# Patient Record
Sex: Female | Born: 1945
Health system: Southern US, Community
[De-identification: ages and names within clinical notes are randomized; demographics above are authoritative.]

## PROBLEM LIST (undated history)

## (undated) DIAGNOSIS — F419 Anxiety disorder, unspecified: Secondary | ICD-10-CM

## (undated) DIAGNOSIS — K5730 Diverticulosis of large intestine without perforation or abscess without bleeding: Secondary | ICD-10-CM

## (undated) DIAGNOSIS — D098 Carcinoma in situ of other specified sites: Secondary | ICD-10-CM

## (undated) DIAGNOSIS — C3490 Malignant neoplasm of unspecified part of unspecified bronchus or lung: Secondary | ICD-10-CM

## (undated) DIAGNOSIS — J45909 Unspecified asthma, uncomplicated: Secondary | ICD-10-CM

## (undated) DIAGNOSIS — E7800 Pure hypercholesterolemia, unspecified: Secondary | ICD-10-CM

## (undated) DIAGNOSIS — R3129 Other microscopic hematuria: Secondary | ICD-10-CM

## (undated) DIAGNOSIS — D093 Carcinoma in situ of thyroid and other endocrine glands: Secondary | ICD-10-CM

## (undated) DIAGNOSIS — J449 Chronic obstructive pulmonary disease, unspecified: Secondary | ICD-10-CM

## (undated) DIAGNOSIS — K635 Polyp of colon: Secondary | ICD-10-CM

## (undated) DIAGNOSIS — Z8541 Personal history of malignant neoplasm of cervix uteri: Secondary | ICD-10-CM

## (undated) HISTORY — DX: Malignant neoplasm of unspecified part of unspecified bronchus or lung: C34.90

## (undated) HISTORY — PX: APPENDECTOMY: SHX54

## (undated) HISTORY — DX: Anxiety disorder, unspecified: F41.9

## (undated) HISTORY — DX: Unspecified asthma, uncomplicated: J45.909

## (undated) HISTORY — DX: Polyp of colon: K63.5

## (undated) HISTORY — PX: ABDOMINAL HYSTERECTOMY: SHX81

## (undated) HISTORY — DX: Diverticulosis of large intestine without perforation or abscess without bleeding: K57.30

## (undated) HISTORY — DX: Pure hypercholesterolemia, unspecified: E78.00

## (undated) HISTORY — DX: Personal history of malignant neoplasm of cervix uteri: Z85.41

## (undated) HISTORY — DX: Carcinoma in situ of other specified sites: D09.8

## (undated) HISTORY — DX: Chronic obstructive pulmonary disease, unspecified: J44.9

## (undated) HISTORY — DX: Carcinoma in situ of thyroid and other endocrine glands: D09.3

## (undated) HISTORY — DX: Other microscopic hematuria: R31.29

---

## 1983-06-25 HISTORY — PX: TOTAL ABDOMINAL HYSTERECTOMY W/ BILATERAL SALPINGOOPHORECTOMY: SHX83

## 1998-06-17 ENCOUNTER — Encounter: Payer: Self-pay | Admitting: Emergency Medicine

## 1998-06-17 ENCOUNTER — Emergency Department (HOSPITAL_COMMUNITY): Admission: EM | Admit: 1998-06-17 | Discharge: 1998-06-17 | Payer: Self-pay | Admitting: Emergency Medicine

## 1998-11-03 ENCOUNTER — Encounter: Payer: Self-pay | Admitting: Family Medicine

## 1998-11-03 ENCOUNTER — Ambulatory Visit (HOSPITAL_COMMUNITY): Admission: RE | Admit: 1998-11-03 | Discharge: 1998-11-03 | Payer: Self-pay

## 1998-11-10 ENCOUNTER — Encounter: Payer: Self-pay | Admitting: Pulmonary Disease

## 1998-11-10 ENCOUNTER — Ambulatory Visit (HOSPITAL_COMMUNITY): Admission: RE | Admit: 1998-11-10 | Discharge: 1998-11-10 | Payer: Self-pay | Admitting: Pulmonary Disease

## 1998-11-27 ENCOUNTER — Encounter: Payer: Self-pay | Admitting: Thoracic Surgery

## 1998-11-28 ENCOUNTER — Encounter: Payer: Self-pay | Admitting: Thoracic Surgery

## 1998-11-28 ENCOUNTER — Inpatient Hospital Stay (HOSPITAL_COMMUNITY): Admission: RE | Admit: 1998-11-28 | Discharge: 1998-12-04 | Payer: Self-pay | Admitting: Thoracic Surgery

## 1998-11-29 ENCOUNTER — Encounter: Payer: Self-pay | Admitting: Thoracic Surgery

## 1998-11-30 ENCOUNTER — Encounter: Payer: Self-pay | Admitting: Thoracic Surgery

## 1998-12-01 ENCOUNTER — Encounter: Payer: Self-pay | Admitting: Thoracic Surgery

## 1998-12-02 ENCOUNTER — Encounter: Payer: Self-pay | Admitting: Thoracic Surgery

## 1998-12-04 ENCOUNTER — Encounter: Payer: Self-pay | Admitting: Thoracic Surgery

## 1998-12-04 ENCOUNTER — Encounter: Payer: Self-pay | Admitting: Pulmonary Disease

## 1999-03-22 ENCOUNTER — Ambulatory Visit (HOSPITAL_COMMUNITY): Admission: RE | Admit: 1999-03-22 | Discharge: 1999-03-22 | Payer: Self-pay | Admitting: Pulmonary Disease

## 1999-03-22 ENCOUNTER — Encounter: Payer: Self-pay | Admitting: Pulmonary Disease

## 1999-06-04 ENCOUNTER — Encounter: Admission: RE | Admit: 1999-06-04 | Discharge: 1999-06-04 | Payer: Self-pay | Admitting: Thoracic Surgery

## 1999-06-04 ENCOUNTER — Encounter: Payer: Self-pay | Admitting: Thoracic Surgery

## 1999-09-12 ENCOUNTER — Encounter: Admission: RE | Admit: 1999-09-12 | Discharge: 1999-09-12 | Payer: Self-pay | Admitting: Thoracic Surgery

## 1999-09-12 ENCOUNTER — Encounter: Payer: Self-pay | Admitting: Thoracic Surgery

## 2000-01-15 ENCOUNTER — Ambulatory Visit (HOSPITAL_COMMUNITY): Admission: RE | Admit: 2000-01-15 | Discharge: 2000-01-15 | Payer: Self-pay | Admitting: Thoracic Surgery

## 2000-01-16 ENCOUNTER — Encounter: Payer: Self-pay | Admitting: Thoracic Surgery

## 2000-07-16 ENCOUNTER — Encounter: Admission: RE | Admit: 2000-07-16 | Discharge: 2000-07-16 | Payer: Self-pay | Admitting: Thoracic Surgery

## 2000-07-16 ENCOUNTER — Encounter: Payer: Self-pay | Admitting: Thoracic Surgery

## 2000-11-14 ENCOUNTER — Ambulatory Visit (HOSPITAL_COMMUNITY): Admission: RE | Admit: 2000-11-14 | Discharge: 2000-11-14 | Payer: Self-pay | Admitting: Thoracic Surgery

## 2000-11-14 ENCOUNTER — Encounter: Payer: Self-pay | Admitting: Thoracic Surgery

## 2001-02-24 ENCOUNTER — Encounter (INDEPENDENT_AMBULATORY_CARE_PROVIDER_SITE_OTHER): Payer: Self-pay | Admitting: Specialist

## 2001-02-24 ENCOUNTER — Ambulatory Visit (HOSPITAL_COMMUNITY): Admission: RE | Admit: 2001-02-24 | Discharge: 2001-02-24 | Payer: Self-pay | Admitting: Gastroenterology

## 2004-05-25 ENCOUNTER — Ambulatory Visit: Payer: Self-pay | Admitting: Pulmonary Disease

## 2005-04-10 ENCOUNTER — Ambulatory Visit: Payer: Self-pay | Admitting: Pulmonary Disease

## 2005-04-11 ENCOUNTER — Ambulatory Visit: Payer: Self-pay | Admitting: Pulmonary Disease

## 2005-06-04 ENCOUNTER — Ambulatory Visit: Payer: Self-pay | Admitting: Pulmonary Disease

## 2006-04-07 ENCOUNTER — Ambulatory Visit: Payer: Self-pay | Admitting: Pulmonary Disease

## 2006-04-09 ENCOUNTER — Ambulatory Visit: Payer: Self-pay | Admitting: Pulmonary Disease

## 2006-04-09 LAB — CONVERTED CEMR LAB
Basophils Relative: 0.5 % (ref 0.0–1.0)
Chloride: 107 meq/L (ref 96–112)
Creatinine, Ser: 0.9 mg/dL (ref 0.4–1.2)
Eosinophil percent: 3.9 % (ref 0.0–5.0)
GFR calc non Af Amer: 68 mL/min
Glomerular Filtration Rate, Af Am: 82 mL/min/{1.73_m2}
Glucose, Bld: 92 mg/dL (ref 70–99)
Lymphocytes Relative: 38 % (ref 12.0–46.0)
Monocytes Absolute: 0.7 10*3/uL (ref 0.2–0.7)
Monocytes Relative: 8.5 % (ref 3.0–11.0)
Neutro Abs: 4.1 10*3/uL (ref 1.4–7.7)
Neutrophils Relative %: 49.1 % (ref 43.0–77.0)
Platelets: 327 10*3/uL (ref 150–400)
Potassium: 4.1 meq/L (ref 3.5–5.1)
RBC: 4.69 M/uL (ref 3.87–5.11)
Sodium: 142 meq/L (ref 135–145)

## 2007-02-25 ENCOUNTER — Ambulatory Visit: Payer: Self-pay | Admitting: Pulmonary Disease

## 2007-04-16 ENCOUNTER — Ambulatory Visit: Payer: Self-pay | Admitting: Pulmonary Disease

## 2007-04-16 LAB — CONVERTED CEMR LAB
AST: 24 units/L (ref 0–37)
Alkaline Phosphatase: 59 units/L (ref 39–117)
CO2: 31 meq/L (ref 19–32)
Chloride: 107 meq/L (ref 96–112)
Creatinine, Ser: 0.8 mg/dL (ref 0.4–1.2)
GFR calc Af Amer: 94 mL/min
GFR calc non Af Amer: 78 mL/min
LDL Cholesterol: 98 mg/dL (ref 0–99)
MCHC: 34.8 g/dL (ref 30.0–36.0)
MCV: 90.2 fL (ref 78.0–100.0)
Neutro Abs: 4.8 10*3/uL (ref 1.4–7.7)
Neutrophils Relative %: 50.2 % (ref 43.0–77.0)
RBC: 4.62 M/uL (ref 3.87–5.11)
Sodium: 143 meq/L (ref 135–145)
Total Bilirubin: 0.9 mg/dL (ref 0.3–1.2)
Total Protein: 7.6 g/dL (ref 6.0–8.3)
Triglycerides: 105 mg/dL (ref 0–149)

## 2007-04-18 DIAGNOSIS — D126 Benign neoplasm of colon, unspecified: Secondary | ICD-10-CM

## 2007-04-18 DIAGNOSIS — M810 Age-related osteoporosis without current pathological fracture: Secondary | ICD-10-CM | POA: Insufficient documentation

## 2007-04-18 DIAGNOSIS — E7800 Pure hypercholesterolemia, unspecified: Secondary | ICD-10-CM | POA: Insufficient documentation

## 2007-04-18 DIAGNOSIS — J209 Acute bronchitis, unspecified: Secondary | ICD-10-CM

## 2007-08-18 ENCOUNTER — Ambulatory Visit: Payer: Self-pay | Admitting: Pulmonary Disease

## 2007-10-28 ENCOUNTER — Telehealth (INDEPENDENT_AMBULATORY_CARE_PROVIDER_SITE_OTHER): Payer: Self-pay | Admitting: *Deleted

## 2007-10-30 ENCOUNTER — Telehealth (INDEPENDENT_AMBULATORY_CARE_PROVIDER_SITE_OTHER): Payer: Self-pay | Admitting: *Deleted

## 2007-11-24 ENCOUNTER — Encounter: Payer: Self-pay | Admitting: Pulmonary Disease

## 2008-04-27 ENCOUNTER — Ambulatory Visit: Payer: Self-pay | Admitting: Pulmonary Disease

## 2008-04-27 DIAGNOSIS — C3490 Malignant neoplasm of unspecified part of unspecified bronchus or lung: Secondary | ICD-10-CM | POA: Insufficient documentation

## 2008-04-28 DIAGNOSIS — J309 Allergic rhinitis, unspecified: Secondary | ICD-10-CM

## 2008-04-28 DIAGNOSIS — J449 Chronic obstructive pulmonary disease, unspecified: Secondary | ICD-10-CM

## 2008-04-28 DIAGNOSIS — C539 Malignant neoplasm of cervix uteri, unspecified: Secondary | ICD-10-CM

## 2008-04-28 DIAGNOSIS — F419 Anxiety disorder, unspecified: Secondary | ICD-10-CM | POA: Insufficient documentation

## 2008-04-28 DIAGNOSIS — F411 Generalized anxiety disorder: Secondary | ICD-10-CM

## 2008-04-28 DIAGNOSIS — K5730 Diverticulosis of large intestine without perforation or abscess without bleeding: Secondary | ICD-10-CM

## 2008-05-03 LAB — CONVERTED CEMR LAB
Alkaline Phosphatase: 67 units/L (ref 39–117)
Basophils Absolute: 0.1 10*3/uL (ref 0.0–0.1)
Bilirubin, Direct: 0.2 mg/dL (ref 0.0–0.3)
CO2: 30 meq/L (ref 19–32)
Calcium: 9.5 mg/dL (ref 8.4–10.5)
Cholesterol: 143 mg/dL (ref 0–200)
Crystals: NEGATIVE
Eosinophils Absolute: 0.3 10*3/uL (ref 0.0–0.7)
GFR calc Af Amer: 94 mL/min
GFR calc non Af Amer: 78 mL/min
HCT: 41.5 % (ref 36.0–46.0)
Hemoglobin: 14.4 g/dL (ref 12.0–15.0)
LDL Cholesterol: 83 mg/dL (ref 0–99)
Leukocytes, UA: NEGATIVE
Lymphocytes Relative: 28.1 % (ref 12.0–46.0)
Neutro Abs: 7 10*3/uL (ref 1.4–7.7)
Neutrophils Relative %: 63.5 % (ref 43.0–77.0)
Nitrite: POSITIVE — AB
RDW: 11.7 % (ref 11.5–14.6)
Specific Gravity, Urine: 1.02 (ref 1.000–1.03)
Total CHOL/HDL Ratio: 3.9
Urobilinogen, UA: 0.2 (ref 0.0–1.0)
WBC: 11.1 10*3/uL — ABNORMAL HIGH (ref 4.5–10.5)
pH: 6 (ref 5.0–8.0)

## 2008-06-21 ENCOUNTER — Ambulatory Visit: Payer: Self-pay | Admitting: Pulmonary Disease

## 2008-08-05 ENCOUNTER — Ambulatory Visit: Payer: Self-pay | Admitting: Pulmonary Disease

## 2008-08-11 DIAGNOSIS — J069 Acute upper respiratory infection, unspecified: Secondary | ICD-10-CM | POA: Insufficient documentation

## 2009-01-26 ENCOUNTER — Encounter: Payer: Self-pay | Admitting: Pulmonary Disease

## 2009-01-31 ENCOUNTER — Encounter: Payer: Self-pay | Admitting: Pulmonary Disease

## 2009-03-31 ENCOUNTER — Telehealth (INDEPENDENT_AMBULATORY_CARE_PROVIDER_SITE_OTHER): Payer: Self-pay | Admitting: *Deleted

## 2009-05-08 ENCOUNTER — Encounter: Payer: Self-pay | Admitting: Adult Health

## 2009-05-08 ENCOUNTER — Ambulatory Visit: Payer: Self-pay | Admitting: Pulmonary Disease

## 2009-05-08 LAB — CONVERTED CEMR LAB
Alkaline Phosphatase: 62 units/L (ref 39–117)
BUN: 10 mg/dL (ref 6–23)
Calcium: 9.5 mg/dL (ref 8.4–10.5)
Chloride: 104 meq/L (ref 96–112)
Creatinine, Ser: 0.8 mg/dL (ref 0.4–1.2)
Eosinophils Absolute: 0.3 10*3/uL (ref 0.0–0.7)
Eosinophils Relative: 2.6 % (ref 0.0–5.0)
Glucose, Bld: 101 mg/dL — ABNORMAL HIGH (ref 70–99)
HDL: 41 mg/dL (ref >39.00)
Leukocytes, UA: NEGATIVE
Lymphocytes Relative: 30.1 % (ref 12.0–46.0)
Lymphs Abs: 3 10*3/uL (ref 0.7–4.0)
Platelets: 299 10*3/uL (ref 150.0–400.0)
Potassium: 4.3 meq/L (ref 3.5–5.1)
RBC: 4.68 M/uL (ref 3.87–5.11)
TSH: 0.33 microintl units/mL — ABNORMAL LOW (ref 0.35–5.50)
Total Bilirubin: 1 mg/dL (ref 0.3–1.2)
Total CHOL/HDL Ratio: 4
Total Protein, Urine: NEGATIVE mg/dL
Total Protein: 7.6 g/dL (ref 6.0–8.3)
VLDL: 23.6 mg/dL (ref 0.0–40.0)
WBC: 10.1 10*3/uL (ref 4.5–10.5)
pH: 6.5 (ref 5.0–8.0)

## 2009-06-15 ENCOUNTER — Telehealth (INDEPENDENT_AMBULATORY_CARE_PROVIDER_SITE_OTHER): Payer: Self-pay | Admitting: *Deleted

## 2009-07-24 ENCOUNTER — Encounter: Payer: Self-pay | Admitting: Pulmonary Disease

## 2009-11-28 ENCOUNTER — Ambulatory Visit: Payer: Self-pay | Admitting: Adult Health

## 2010-02-21 ENCOUNTER — Encounter: Payer: Self-pay | Admitting: Pulmonary Disease

## 2010-03-01 ENCOUNTER — Encounter: Payer: Self-pay | Admitting: Pulmonary Disease

## 2010-05-10 ENCOUNTER — Ambulatory Visit: Payer: Self-pay | Admitting: Pulmonary Disease

## 2010-05-12 DIAGNOSIS — R3129 Other microscopic hematuria: Secondary | ICD-10-CM

## 2010-05-12 LAB — CONVERTED CEMR LAB
Basophils Relative: 0.7 % (ref 0.0–3.0)
Bilirubin, Direct: 0.1 mg/dL (ref 0.0–0.3)
CO2: 30 meq/L (ref 19–32)
Cholesterol: 150 mg/dL (ref 0–200)
GFR calc non Af Amer: 75.84 mL/min (ref >60)
HCT: 42.5 % (ref 36.0–46.0)
Hemoglobin: 14.7 g/dL (ref 12.0–15.0)
Lymphocytes Relative: 32.5 % (ref 12.0–46.0)
MCHC: 34.5 g/dL (ref 30.0–36.0)
MCV: 92.2 fL (ref 78.0–100.0)
Monocytes Absolute: 0.7 10*3/uL (ref 0.1–1.0)
Monocytes Relative: 7.1 % (ref 3.0–12.0)
Neutrophils Relative %: 57.4 % (ref 43.0–77.0)
Platelets: 299 10*3/uL (ref 150.0–400.0)
RDW: 12.4 % (ref 11.5–14.6)
Sodium: 141 meq/L (ref 135–145)
Total Bilirubin: 0.8 mg/dL (ref 0.3–1.2)
Total Protein: 7.4 g/dL (ref 6.0–8.3)
VLDL: 23.2 mg/dL (ref 0.0–40.0)
WBC: 9.8 10*3/uL (ref 4.5–10.5)

## 2010-06-26 ENCOUNTER — Telehealth (INDEPENDENT_AMBULATORY_CARE_PROVIDER_SITE_OTHER): Payer: Self-pay | Admitting: *Deleted

## 2010-07-24 NOTE — Letter (Signed)
Summary: [facility]  [facility]   Imported By: Sherian Rein 08/04/2009 13:05:53  _____________________________________________________________________  External Attachment:    Type:   Image     Comment:   External Document

## 2010-07-24 NOTE — Assessment & Plan Note (Signed)
Summary: cpx/fasting/apc   Primary Care Provider:  Dr. Alroy Dust  CC:  2 year ROV & CPX....  History of Present Illness: 65 y/o WF here for a follow up visit and CPX... she has multiple medical problems including COPD, hx adenocarcinoma RLL w/ lobectomy 2000 & no known recurrence, Hyperlipidemia, hx ca cervix w/ hyst 1985, osteopenia, and anxiety...   ~  May 10, 2010:  she states she's been doing well- denies cough, phlegm, dyspnea... she quit smoking in 1997 but still chews nicorette gum daily... notes that she's been walking some & "exercise really helps my breathing"... Chol controlled on Lip10;  GI stable & up to date;  she tells me she is NOT taking her Actonel & may as well stop this Rx officially (despite her osteopenia)... she was found to have some microscopic hematuria & right flank discomfort w/ thorough eval by DrTannenbaum- neg CT, cysto, NMP22- he continues to follow this a stress incontinence...    Current Problem List:  ALLERGIC RHINITIS (ICD-477.9) - on ALLEGRA 180mg /d,  FLONASE 2spQhs...  COPD (ICD-496) & ASTHMATIC BRONCHITIS, ACUTE (ICD-466.0) - on ADVAIR 250Bid,  SINGULAIR 10mg /d... states doing well, breathing well... denies cough, sputum, hemoptysis, worsening dyspnea,  wheezing, chest pains, snoring, daytime hypersomnolence, etc...   ~  baseline CXR w/ pleural thickening on right (prev surg), NAD...  ~  PFT 10/08 showed FVC= 2.69 (108%), FEV1= 1.27 (70%), %1sec= 47, mid-flows= 17%pred.  ~  CXR 11/09 & 11/10 showed COPD, NAD.  ~  CXR 11/11 showed COPD w/ bullous dis, scar right base, NAD...  Hx of ADENOCARCINOMA, LUNG (ICD-162.9) - former very heavy smoker (up to 2ppd) and quit in 1997... routine CXR in 2000 showed a 1.5cm RLL nodule... eval revealed a non-small cell cancer-  s/p RLLobectomy 6/00 by DrBurney for poorly diff AdenoCa... Oncology eval by Duwayne Heck w/ no other therapy recommended... no known recurrence w/ yearly f/u since 2000.  HYPERCHOLESTEROLEMIA  (ICD-272.0) - on LIPITOR 10mg /d...  ~  FLP 10/08 on Lip10 showed TChol 168,TG 105, HDL 49, LDL 98... rec- same med + better diet!  ~  FLP 11/09 on Lip10 showed TChol 143, TG 120, HDL 37, LDL 83  ~  FLP 11/10 on Lip10 showed TChol 156, TG 118, HDL 41, LDL 91  ~  FLP 11/11 on Lip10 showed TChol 150, TG 116, HDL 41, LDL 86  DIVERTICULOSIS OF COLON (ICD-562.10) COLONIC POLYPS (ICD-211.3) - tubular adenoma removed in 1997... last colonoscopy 6/07 by [facility] showing diminutive polyp and divertics... f/u planned 55yrs.  Hx of CERVICAL CANCER (ICD-180.9) & MICROSCOPIC HEMATURIA (ICD-599.72) - s/p hysterectomy & unilateral oopherectomy for Ca in situ of cervix 1985 by DrMcPhail... she is now followed by DrKRichardson & had some microscopic hematuria noted> referred to Urology w/ eval DrTannenbaum (neg CT cysto, NMP22).  OSTEOPOROSIS (ICD-733.00) - prev on Actonel but she stopped this on her own & doesn't want to restart bisphos therapy... takes Calcium supplements & Vitamins...  ~  BMD at [facility] 9/06 showed TScores -1.9 in Spine,  -1.8 in left THip,  -2.1 in left FemNeck.  ~  labs 11/09 showed Vit D level = 32... rec to start OTC Vit D supplement  ~1000u daily.  ~  labs 11/10 showed Vit D level = 35... continue daily supplementation.  ~  she will inquire about f/u BMD & have results sent to [place] to review...  ANXIETY (ICD-300.00) - takes ATIVAN 1mg  Qhs...  Health Maintenance: she takes ASA 81mg /d...  ~  GI:  followed by [facility] w/ last colon 6/07 w/ diminutive polyp & divertics> f/u due 6/12  ~  GYN:  followed by DrRichardson for PAP etc (she sent her to Urology for hematuria)... goes to [facility] for Mammograms...  ~  Immunizations:  she had a PNEUMOVAX in 2003... gets yearly FLU shots...   Preventive Screening-Counseling & Management  Alcohol-Tobacco     Smoking Status: quit     Packs/Day: 2.0     Year Quit: 1997     Pack years: 31yrs, 2 ppd  Allergies: 1)  !  Sulfa  Comments:  Nurse/Medical Assistant: The patient's medications and allergies were reviewed with the patient and were updated in the Medication and Allergy Lists.  Past History:  Past Medical History: ALLERGIC RHINITIS (ICD-477.9) ASTHMATIC BRONCHITIS, ACUTE (ICD-466.0) COPD (ICD-496) Hx of ADENOCARCINOMA, LUNG (ICD-162.9) HYPERCHOLESTEROLEMIA (ICD-272.0) DIVERTICULOSIS OF COLON (ICD-562.10) COLONIC POLYPS (ICD-211.3) Hx of CERVICAL CANCER (ICD-180.9) MICROSCOPIC HEMATURIA (ICD-599.72) OSTEOPOROSIS (ICD-733.00) ANXIETY (ICD-300.00)  Past Surgical History: S/P appendectomy S/P hysterectomy and unilateral oopherectomy in 1985 by DrMcPhail for Ca of cervix  Family History: Reviewed history from 05/08/2009 and no changes required. asthma - Mat aunt heart disease - father, PGF, MGM rheumatism - mother cancer - PGM (breast),  MGF (prostate) hypercholesterolemia - father DM - brother stroke - father  Social History: Reviewed history from 05/08/2009 and no changes required. former smoker.  quit in 1990, x74yrs 2ppd. no alcohol caffeine use: 3 sodas daily. 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    Vital Signs:  Patient profile:   65 year old female Height:      58 inches Weight:      147.25 pounds BMI:     30.89 O2 Sat:      98 % on Room air Temp:     97.8 degrees F oral Pulse rate:   72 / minute BP sitting:   132 / 78  (left arm) Cuff size:    regular  Vitals Entered By: Randell Loop CMA (May 10, 2010 10:32 AM)  O2 Sat at Rest %:  98 O2 Flow:  Room air CC: 2 year ROV & CPX... Is Patient Diabetic? No Pain Assessment Patient in pain? yes      Onset of pain  pain in left leg that wakes her up sometimes Comments meds updated today with pt    Physical Exam  Additional Exam:  WD, petite, overweight, 65 y/o WF in NAD... GENERAL:  Alert & oriented; pleasant & cooperative... HEENT:  [place]/AT, EOM-wnl, PERRLA, EACs-clear, TMs-wnl, NOSE-clear drainage , THROAT-clear & wnl. NECK:  Supple w/ fairROM; no JVD; normal carotid impulses w/o bruits; no thyromegaly or nodules palpated; no lymphadenopathy. CHEST:  Decr BS bilat but clear to P & A- without wheezes/ rales/ or rhonchi heard... HEART:  Regular Rhythm; without murmurs/ rubs/ or gallops detected... ABDOMEN:  Soft & nontender; normal bowel sounds; no organomegaly or masses palpated... EXT: without deformities, mild arthritic changes; no varicose veins/ venous insuffic/ or edema. NEURO:   no focal deficits noted.     CXR  Procedure date:  05/10/2010  Findings:  CHEST - 2 VIEW Comparison: Plain films of the chest 04/27/2008 and 05/08/2009.   Findings: Lungs are emphysematous with bullous change.  There is some mild scar in the right lung base.  Lungs otherwise clear. Heart size is normal.  No pleural effusion or pneumothorax.   IMPRESSION: COPD.  No acute finding.   Read By:  Charyl Dancer,  M.D.   MISC. Report  Procedure date:  05/10/2010  Findings:      BMP (METABOL)   Sodium                    141 mEq/L                   135-145   Potassium                 4.3 mEq/L                   3.5-5.1   Chloride                  101 mEq/L                   96-112   Carbon Dioxide            30 mEq/L                    19-32   Glucose                   79 mg/dL                    82-95   BUN                       15 mg/dL                    6-21    Creatinine                0.8 mg/dL                   3.0-8.6   Calcium                   9.3 mg/dL                   5.7-84.6   GFR                       75.84 mL/min                >60  Hepatic/Liver Function Panel (HEPATIC)   Total Bilirubin           0.8 mg/dL                   9.6-2.9   Direct Bilirubin          0.1 mg/dL                   5.2-8.4   Alkaline Phosphatase      70 U/L                      39-117   AST                       23 U/L  0-37   ALT                       23 U/L                      0-35   Total Protein             7.4 g/dL                    8.4-6.9   Albumin                   4.3 g/dL                    6.2-9.5  CBC Platelet w/Diff (CBCD)   White Cell Count          9.8 K/uL                    4.5-10.5   Red Cell Count            4.60 Mil/uL                 3.87-5.11   Hemoglobin                14.7 g/dL                   28.4-13.2   Hematocrit                42.5 %                      36.0-46.0   MCV                       92.2 fl                     78.0-100.0   Platelet Count            299.0 K/uL                  150.0-400.0   Neutrophil %              57.4 %                      43.0-77.0   Lymphocyte %              32.5 %                      12.0-46.0   Monocyte %                7.1 %                       3.0-12.0   Eosinophils%              2.3 %                       0.0-5.0   Basophils %               0.7 %                       0.0-3.0  Comments:      Lipid Panel (LIPID)   Cholesterol  150 mg/dL                   1-610   Triglycerides             116.0 mg/dL                 9.6-045.4   HDL                       09.81 mg/dL                 >19.14   LDL Cholesterol           86 mg/dL                    7-82  TSH (TSH)   FastTSH              [L]  0.31 uIU/mL                 0.35-5.50   Impression & Recommendations:  Problem # 1:  PHYSICAL EXAMINATION (ICD-V70.0)  Orders: TLB-BMP (Basic Metabolic  Panel-BMET) (80048-METABOL) TLB-Hepatic/Liver Function Pnl (80076-HEPATIC) TLB-CBC Platelet - w/Differential (85025-CBCD) TLB-Lipid Panel (80061-LIPID) TLB-TSH (Thyroid Stimulating Hormone) (84443-TSH)  Problem # 2:  COPD (ICD-496) She is an ex-smoker, still chews nicorette gum after 15 yrs... continue meds & exerc program... Her updated medication list for this problem includes:    Singulair 10 Mg Tabs (Montelukast sodium) .Marland Kitchen... Take 1 tablet by mouth once a day    Advair Diskus 250-50 Mcg/dose Misc (Fluticasone-salmeterol) .Marland Kitchen... Take one puff two times a day -  rinse mouth after each use  Orders: T-2 View CXR (71020TC)  Problem # 3:  Hx of ADENOCARCINOMA, LUNG (ICD-162.9) No known recurrence...  Problem # 4:  HYPERCHOLESTEROLEMIA (ICD-272.0) Stable on Lip10 + diet... Her updated medication list for this problem includes:    Lipitor 10 Mg Tabs (Atorvastatin calcium) .Marland Kitchen... Take 1 tablet by mouth once a day  Problem # 5:  COLONIC POLYPS (ICD-211.3) GI is stable & up to date...  Problem # 6:  MICROSCOPIC HEMATURIA (ICD-599.72) GU followed by DrRichardson & DrTannenbaum...  Problem # 7:  OSTEOPOROSIS (ICD-733.00) She refuses bisphos therapy & wants a drug holiday... she will inquire having f/u BMD... The following medications were removed from the medication list:    Actonel 35 Mg Tabs (Risedronate sodium) .Marland Kitchen... Take one tablet every week  Problem # 8:  OTHER MEDICAL PROBLEMS AS NOTED>>>  Complete Medication List: 1)  Allegra 180 Mg Tabs (Fexofenadine hcl) .... Take 1 tablet by mouth once a day 2)  Flonase 50 Mcg/act Susp (Fluticasone propionate) .... 2 sprays two times a day 3)  Singulair 10 Mg Tabs (Montelukast sodium) .... Take 1 tablet by mouth once a day 4)  Advair Diskus 250-50 Mcg/dose Misc (Fluticasone-salmeterol) .... Take one puff two times a day -  rinse mouth after each use 5)  Adult Aspirin Ec Low Strength 81 Mg Tbec (Aspirin) .... Take 1 tablet by mouth once a  day 6)  Lipitor 10 Mg Tabs (Atorvastatin calcium) .... Take 1 tablet by mouth once a day 7)  Calcium Carbonate-vitamin D 600-400 Mg-unit Tabs (Calcium carbonate-vitamin d) .... Take 1 tablet by mouth once a day 8)  Vitamin D 1000 Unit Tabs (Cholecalciferol) .... Take 1 tablet by mouth once a day 9)  Ativan 1 Mg Tabs (Lorazepam) .... Take one tablet at bedtime as needed  Patient Instructions: 1)  Today  we updated your med list- see below.... 2)  we refilled your meds for 90d supplies as requested... 3)  Today we did your folllow up CXR & Fasting blood work... please call the "phone tree" in a few days for your lab results.Marland KitchenMarland Kitchen 4)  Call for any problems.Marland KitchenMarland Kitchen 5)  Please schedule a follow-up appointment in 1 year, sooner as needed. Prescriptions: ATIVAN 1 MG  TABS (LORAZEPAM) take one tablet at bedtime as needed  #90 x 4   Entered and Authorized by:   Michele Mcalpine MD   Signed by:   Michele Mcalpine MD on 05/10/2010   Method used:   Print then Give to Patient   RxID:   [id number] LIPITOR 10 MG  TABS (ATORVASTATIN CALCIUM) Take 1 tablet by mouth once a day  #90 x 4   Entered and Authorized by:   Michele Mcalpine MD   Signed by:   Michele Mcalpine MD on 05/10/2010   Method used:   Print then Give to Patient   RxID:   [id number] ADVAIR DISKUS 250-50 MCG/DOSE MISC (FLUTICASONE-SALMETEROL) take one puff two times a day -  rinse mouth after each use  #3 x 4   Entered and Authorized by:   Michele Mcalpine MD   Signed by:   Michele Mcalpine MD on 05/10/2010   Method used:   Print then Give to Patient   RxID:   [id number] SINGULAIR 10 MG  TABS (MONTELUKAST SODIUM) Take 1 tablet by mouth once a day  #90 x 4   Entered and Authorized by:   Michele Mcalpine MD   Signed by:   Michele Mcalpine MD on 05/10/2010   Method used:   Print then Give to Patient   RxID:   [id number] FLONASE 50 MCG/ACT  SUSP (FLUTICASONE PROPIONATE) 2 sprays two times a day  #3 x 4   Entered and Authorized by:   Michele Mcalpine  MD   Signed by:   Michele Mcalpine MD on 05/10/2010   Method used:   Print then Give to Patient   RxID:   [id number] ALLEGRA 180 MG  TABS (FEXOFENADINE HCL) Take 1 tablet by mouth once a day  #90 x 4   Entered and Authorized by:   Michele Mcalpine MD   Signed by:   Michele Mcalpine MD on 05/10/2010   Method used:   Print then Give to Patient   RxID:   [id number]    Immunization History:  Influenza Immunization History:    Influenza:  historical (04/02/2010)

## 2010-07-24 NOTE — Assessment & Plan Note (Signed)
Summary: Acute NP office visit   Visit Type:  Acute NP office visit Primary Provider/Referring Provider:  Dr. Alroy Dust  CC:  Pt c/o PND, eyes, nose, and throat burning x 1 day, left side nasal congestion, right side runny nose, productive cough, SOB with exertion. Pt states is only using Advair once at night. Pt states if Rx called in today [facility] [address]. Pt denies chest tightness, and wheezing.  History of Present Illness: 65  year old female with known history of asthma and allergic rhinitis  -previous lung cancer -s/p RLL lobectomy    August 05, 2008 --Presents for an acute visit office viist for sinus pain, congestion, pressure, cough w/ .yellow and green sputum., loose stools, lightheaded.   May 08, 2009--Presents for annual visit w/ labs. Since last visit, doing well. Recent BMD showed T score of -2.2 on actonel. Mammogram was neg. She is overdue for routine GYN visit. Colonoscopy is due in 2012. We discussed healthy lifestyle  measures such as diet, exercsie.  Blood pressure has been borderline last couple visit. Recommended to decrease sodium, weight loss. keep b/p log.     November 28, 2009--Presents for an acute office visit. Complains of Pt c/o PND, eyes, nose, and throat burning x 1 day, left side nasal congestion, right side runny nose, productive cough, SOB with exertion. Pt states is only using Advair once at night. Pt states if Rx called in today [facility].. OTC not helping. Restarted on allegra today.Denies chest pain, dyspnea, orthopnea, hemoptysis, fever, n/v/d, edema, headache,recent travel or antibiotics. Pt c/o PND, eyes, nose, and throat burning x 1 day, left side nasal congestion, right side runny nose, productive cough, SOB with exertion. Pt states is only using Advair once at night. Pt states if Rx called in today [facility]. Pt denies chest tightness,  wheezing  Medications Prior to Update: 1)  Allegra 180 Mg  Tabs (Fexofenadine Hcl) .... Take 1 Tablet By Mouth Once A Day 2)  Flonase 50 Mcg/act  Susp (Fluticasone Propionate) .... 2 Sprays Two Times A Day 3)  Singulair 10 Mg  Tabs (Montelukast Sodium) .... Take 1 Tablet By Mouth Once A Day 4)  Advair Diskus 250-50 Mcg/dose Misc (Fluticasone-Salmeterol) .... Take One Puff Two Times A Day -  Rinse Mouth After Each Use 5)  Adult Aspirin Ec Low Strength 81 Mg  Tbec (Aspirin) .... Take 1 Tablet By Mouth Once A Day 6)  Lipitor 10 Mg  Tabs (Atorvastatin Calcium) .... Take 1 Tablet By Mouth Once A Day 7)  Actonel 35 Mg  Tabs (Risedronate Sodium) .... Take One Tablet Every Week 8)  Calcium Carbonate-Vitamin D 600-400 Mg-Unit  Tabs (Calcium Carbonate-Vitamin D) .... Take 1 Tablet By Mouth Once A Day 9)  Ativan 1 Mg  Tabs (Lorazepam) .... Take One Tablet At Bedtime As Needed 10)  Avelox 400 Mg Tabs (Moxifloxacin Hcl) .Marland Kitchen.. 1 By Mouth Once Daily Until Gone  Current Medications (verified): 1)  Allegra 180 Mg  Tabs (Fexofenadine Hcl) .... Take 1 Tablet By Mouth Once A Day 2)  Flonase 50 Mcg/act  Susp (Fluticasone Propionate) .... 2 Sprays Two Times A Day 3)  Singulair 10 Mg  Tabs (Montelukast Sodium) .... Take 1 Tablet By Mouth Once A Day 4)  Advair Diskus 250-50 Mcg/dose Misc (Fluticasone-Salmeterol) .... Take One Puff Two Times A Day -  Rinse Mouth After Each Use 5)  Adult Aspirin Ec Low Strength 81 Mg  Tbec (Aspirin) .... Take 1 Tablet By Mouth Once A Day 6)  Lipitor 10 Mg  Tabs (Atorvastatin Calcium) .... Take 1 Tablet By Mouth Once A Day 7)  Actonel 35 Mg  Tabs (Risedronate Sodium) .... Take One Tablet Every Week 8)  Calcium Carbonate-Vitamin D 600-400 Mg-Unit  Tabs (Calcium Carbonate-Vitamin D) .... Take 1 Tablet By Mouth Once A Day 9)  Ativan 1 Mg  Tabs (Lorazepam) .... Take One Tablet At Bedtime As Needed  Allergies (verified): 1)  ! Sulfa  Past History:  Family History: Last updated:  05/08/2009 asthma - Mat aunt heart disease - father, PGF, MGM rheumatism - mother cancer - PGM (breast),  MGF (prostate) hypercholesterolemia - father DM - brother stroke - father  Social History: Last updated: 05/08/2009 former smoker.  quit in 1990, x72yrs 2ppd. no alcohol caffeine use: 3 sodas daily. 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 gets yearly FLU shots... she takes ASA 81mg /d...  ALLERGIC RHINITIS (ICD-477.9) - on ALLEGRA 180mg /d,  FLONASE 2spQhs...  ASTHMATIC BRONCHITIS, ACUTE (ICD-466.0), & COPD (ICD-496) - on ADVAIR 250Bid,  SINGULAIR 10mg /d... states doing well, breathing well...    ~  baseline CXR w/ pleural thickening on right (prev surg), NAD...  ~  PFT 10/08 showed FVC= 2.69 (108%), FEV1= 1.27 (70%), %1sec= 47, mid-flows= 17%pred...  Hx of ADENOCARCINOMA, LUNG (ICD-162.9) - former very heavy smoker and quit in 1997... routine CXR in 2000 showed a 1.5cm RLL nodule... eval revealed a non-small cell cancer-  s/p RLLobectomy 6/00 by DrBurney for poorly diff AdenoCa... Oncology eval by Duwayne Heck w/ no other therapy recommended... no known recurrence w/ yearly f/u since 2000.  HYPERCHOLESTEROLEMIA (ICD-272.0) - on LIPITOR 10mg /d...  ~  FLP 10/08 showed TChol 168,TG 105, HDL 49, LDL 98... rec- same med + better diet!  ~ FLP 11/10 showed   HDL:41.00   LDL:91  Chol:156   Trig:118.0   DIVERTICULOSIS OF COLON (ICD-562.10) COLONIC POLYPS (ICD-211.3) - tubular adenoma removed in 1997... last colonoscopy 6/07 by [facility] showing diminutive polyp and divertics... f/u planned 74yrs.  Hx of CERVICAL CANCER (ICD-180.9) - s/p hysterectomy & unilateral oopherectomy for Ca in situ of cervix 1985 by DrMcPhail...  OSTEOPOROSIS (ICD-733.00) - on ACTONEL 35mg /wk + Calcium supplements & Vitamins...  ~  BMD at [facility] 9/06 showed TScores -1.9 in Spine,  -1.8 in left THip   ~ BMD at [facility] 8/10  showed TScores- -1.9 Spine, -1.9 hip, and -2.2 left hem neck   Vital Signs:  Patient profile:   65 year old female Height:      58 inches Weight:      148 pounds BMI:     31.04 O2 Sat:      94 % on Room air Temp:     99.7 degrees F oral Pulse rate:   82 / minute BP sitting:   152 / 82  (left arm) Cuff size:   regular  Vitals Entered By: Zackery Barefoot CMA (November 28, 2009 3:11 PM)  O2 Flow:  Room air CC: Pt c/o PND, eyes, nose, and throat burning x 1 day, left side nasal congestion, right side runny nose, productive cough, SOB with exertion. Pt states is only using Advair once at night. Pt states if [facility] called [facility]. Pt denies chest tightness, wheezing Is Patient Diabetic? No Comments Medications reviewed with  patient Verified contact number and pharmacy with patient Zackery Barefoot CMA  November 28, 2009 3:19 PM    Physical Exam  Additional Exam:  WD, petite, overweight, 65 y/o WF in NAD... GENERAL:  Alert & oriented; pleasant & cooperative... HEENT:  [place]/AT, EOM-wnl, PERRLA, EACs-clear, TMs-wnl, NOSE-clear drainage , THROAT-clear & wnl. NECK:  Supple w/ fairROM; no JVD; normal carotid impulses w/o bruits; no thyromegaly or nodules palpated; no lymphadenopathy. CHEST:  Clear to P & A; without wheezes/ rales/ or rhonchi heard... HEART:  Regular Rhythm; without murmurs/ rubs/ or gallops detected... ABDOMEN:  Soft & nontender; normal bowel sounds; no organomegaly or masses palpated... EXT: without deformities, mild arthritic changes; no varicose veins/ venous insuffic/ or edema. NEURO:   no focal deficits noted.      Impression & Recommendations:  Problem # 1:  UPPER RESPIRATORY INFECTION (ICD-465.9)  Allegiic rhiniis flare vs URI  REC:  Increase Advair 1 puff two times a day  Continue Allegra qam  May add benadryl 25mg  at bedtime for 3 days Saline nasal rinses as needed  Add Astepro 2 puffs at bedtime until sample is gone.   May have Omnicef 300mg  two times a day for 5 days if symptoms worsen with discolored.  Please contact office for sooner follow up if symptoms do not improve or worsen   Her updated medication list for this problem includes:    Allegra 180 Mg Tabs (Fexofenadine hcl) .Marland Kitchen... Take 1 tablet by mouth once a day    Adult Aspirin Ec Low Strength 81 Mg Tbec (Aspirin) .Marland Kitchen... Take 1 tablet by mouth once a day  Orders: Est. Patient Level III (09811)  Problem # 2:       Medications Added to Medication List This Visit: 1)  Cefdinir 300 Mg Caps (Cefdinir) .Marland Kitchen.. 1 by mouth two times a day  Complete Medication List: 1)  Allegra 180 Mg Tabs (Fexofenadine hcl) .... Take 1 tablet by mouth once a day 2)  Flonase 50 Mcg/act Susp (Fluticasone propionate) .... 2 sprays two times a day 3)  Singulair 10 Mg Tabs (Montelukast sodium) .... Take 1 tablet by mouth once a day 4)  Advair Diskus 250-50 Mcg/dose Misc (Fluticasone-salmeterol) .... Take one puff two times a day -  rinse mouth after each use 5)  Adult Aspirin Ec Low Strength 81 Mg Tbec (Aspirin) .... Take 1 tablet by mouth once a day 6)  Lipitor 10 Mg Tabs (Atorvastatin calcium) .... Take 1 tablet by mouth once a day 7)  Actonel 35 Mg Tabs (Risedronate sodium) .... Take one tablet every week 8)  Calcium Carbonate-vitamin D 600-400 Mg-unit Tabs (Calcium carbonate-vitamin d) .... Take 1 tablet by mouth once a day 9)  Ativan 1 Mg Tabs (Lorazepam) .... Take one tablet at bedtime as needed 10)  Cefdinir 300 Mg Caps (Cefdinir) .Marland Kitchen.. 1 by mouth two times a day  Patient Instructions: 1)  Increase Advair 1 puff two times a day  2)  Continue Allegra qam  3)  May add benadryl 25mg  at bedtime for 3 days 4)  Saline nasal rinses as needed  5)  Add Astepro 2 puffs at bedtime until sample is gone.  6)  May have Omnicef 300mg  two times a day for 5 days if symptoms worsen with discolored.  7)  Please contact office for sooner follow up if symptoms do not improve or worsen   Prescriptions: CEFDINIR 300 MG CAPS (CEFDINIR) 1 by mouth two times a day  #10 x 0  Entered and Authorized by:   Rubye Oaks NP   Signed by:   Olie Scaffidi NP on 11/28/2009   Method used:   Print then Give to Patient   RxID:   [id number]

## 2010-07-24 NOTE — Letter (Signed)
Summary: [facility]  [facility]   Imported By: Lester Vernon 02/28/2010 09:45:10  _____________________________________________________________________  External Attachment:    Type:   Image     Comment:   External Document

## 2010-07-26 NOTE — Progress Notes (Signed)
Summary: prescription  Phone Note Call from Patient   Caller: Patient Call For: Dr. Kriste Basque Summary of Call: Patient phoned she is coughing up yellowish colored mucas she is taking Musinex she also has a yellowish nasal discharge. Patient would like a prescription called in to [facility] on [address]. Patient can be reached at [phone] Initial call taken by: Vedia Coffer,  June 26, 2010 8:54 AM  Follow-up for Phone Call        Spoke with pt and she is c/o productive cough with yellow phlegm, nasal congestion with yellow mucus, and hoarseness and body aches x 4 days. Pt has been using mucinex without relief. Please advise. Carron Curie CMA  June 26, 2010 10:15 AM allergies: SULFA  Additional Follow-up for Phone Call Additional follow up Details #1::        per SN: augmentin 875mg , 1 by mouth two times a day #14, no refills. Boone Master CNA/MA  June 26, 2010 11:42 AM.  Jeanene Erb, spoke with pt.  She is aware of above recs per SN and aware rx sent to [facility].  She verbalized understanding.  Additional Follow-up by: Gweneth Dimitri RN,  June 26, 2010 11:51 AM    New/Updated Medications: AUGMENTIN 875-125 MG TABS (AMOXICILLIN-POT CLAVULANATE) Take 1 tablet by mouth two times a day Prescriptions: AUGMENTIN 875-125 MG TABS (AMOXICILLIN-POT CLAVULANATE) Take 1 tablet by mouth two times a day  #14 x 0   Entered by:   Gweneth Dimitri RN   Authorized by:   Michele Mcalpine MD   Signed by:   Gweneth Dimitri RN on 06/26/2010   Method used:   Electronically to        [facility]. [phone]* (retail)       [address]       [place], [place]  [address]       Ph: [phone]       Fax: [phone]   RxID:   [id number]

## 2010-10-31 ENCOUNTER — Ambulatory Visit (INDEPENDENT_AMBULATORY_CARE_PROVIDER_SITE_OTHER): Payer: BC Managed Care – PPO | Admitting: Adult Health

## 2010-10-31 ENCOUNTER — Encounter: Payer: Self-pay | Admitting: Adult Health

## 2010-10-31 DIAGNOSIS — B029 Zoster without complications: Secondary | ICD-10-CM

## 2010-10-31 MED ORDER — PREDNISONE 10 MG PO TABS: ORAL_TABLET | ORAL | Status: AC

## 2010-10-31 MED ORDER — VALACYCLOVIR HCL 1 G PO TABS: 1000.0000 mg | ORAL_TABLET | Freq: Three times a day (TID) | ORAL | Status: AC

## 2010-10-31 NOTE — Patient Instructions (Signed)
Valtrex 1 gram Three times a day  For 7 days  Prednisone taper over next week.  Cool compresses  Avoid hot showers  follow up in 2 weeks  Please contact office for sooner follow up if symptoms do not improve or worsen or seek emergency care

## 2010-10-31 NOTE — Progress Notes (Signed)
  Subjective:    Patient ID: Pamela Terry, female    DOB: December 24, 1945, 65 y.o.   MRN: [id number]  HPI 65 yo with known hx of COPD   10/31/10 Acute OV Pt presents for work in visit. Complains red, itchy, burns rash on left buttock x6days. Had one painful bump 6 days  Ago then started breaking out more last couple of days Very sensitive to touch. Burns along entire left buttock.  No fever or new meds.   No previous episodes. No hx of herpes simplex virus. Had chicken pox as child.    Review of Systems Constitutional:   No  weight loss, night sweats,  Fevers, chills, fatigue, or  lassitude.  HEENT:   No headaches,  Difficulty swallowing,  Tooth/dental problems, or  Sore throat,                No sneezing, itching, ear ache, nasal congestion, post nasal drip,   CV:  No chest pain,  Orthopnea, PND, swelling in lower extremities, anasarca, dizziness, palpitations, syncope.   GI  No heartburn, indigestion, abdominal pain, nausea, vomiting, diarrhea, change in bowel habits, loss of appetite, bloody stools.   Resp: No shortness of breath with exertion or at rest.  No excess mucus, no productive cough,  No non-productive cough,  No coughing up of blood.  No change in color of mucus.  No wheezing.  No chest wall deformity  Skin:+ rash or lesions.along butt  GU: no dysuria, change in color of urine, no urgency or frequency.  No flank pain, no hematuria   MS:  No joint pain or swelling.  No decreased range of motion.  No back pain.  Psych:  No change in mood or affect. No depression or anxiety.  No memory loss.          Objective:   Physical Exam GEN: A/Ox3; pleasant , NAD, well nourished   HEENT:  [place]/AT,  EACs-clear, TMs-wnl, NOSE-clear, THROAT-clear, no lesions, no postnasal drip or exudate noted.   NECK:  Supple w/ fair ROM; no JVD; normal carotid impulses w/o bruits; no thyromegaly or nodules palpated; no lymphadenopathy.  RESP  Clear  P & A; w/o, wheezes/ rales/ or rhonchi.no  accessory muscle use, no dullness to percussion  CARD:  RRR, no m/r/g  , no peripheral edema, pulses intact, no cyanosis or clubbing.  GI:   Soft & nt; nml bowel sounds; no organomegaly or masses detected.  Musco: Warm bil, no deformities or joint swelling noted.   Neuro: alert, no focal deficits noted.    Skin: Warm, no lesions or rashes Along left buttock several patches of vesicles and crusted lesions.           Assessment & Plan:

## 2010-11-04 DIAGNOSIS — B029 Zoster without complications: Secondary | ICD-10-CM | POA: Insufficient documentation

## 2010-11-04 NOTE — Assessment & Plan Note (Signed)
Left buttock herpes zoster-- Plan :  Declines pain meds  Valtrex 1 gram Three times a day  For 7 days  Prednisone taper over next week.  Cool compresses  Avoid hot showers  follow up in 2 weeks  Please contact office for sooner follow up if symptoms do not improve or worsen or seek emergency care

## 2010-11-14 ENCOUNTER — Ambulatory Visit (INDEPENDENT_AMBULATORY_CARE_PROVIDER_SITE_OTHER): Payer: BC Managed Care – PPO | Admitting: Adult Health

## 2010-11-14 ENCOUNTER — Encounter: Payer: Self-pay | Admitting: Adult Health

## 2010-11-14 DIAGNOSIS — B029 Zoster without complications: Secondary | ICD-10-CM

## 2010-11-14 NOTE — Assessment & Plan Note (Addendum)
Healing, with minimal pain   Plan:  Keep area clean and dry.  Apply neosporin to scab until healed daily  Please contact office for sooner follow up if symptoms do not improve or worsen or seek emergency care   follow up Dr. Kriste Basque  6 months for physical.

## 2010-11-14 NOTE — Patient Instructions (Signed)
Keep area clean and dry.  Apply neosporin to scab until healed daily  Please contact office for sooner follow up if symptoms do not improve or worsen or seek emergency care   follow up Dr. Kriste Basque  6 months for physical.

## 2010-11-14 NOTE — Progress Notes (Signed)
  Subjective:    Patient ID: Pamela Terry, female    DOB: 06/06/1946, 65 y.o.   MRN: [id number]  HPI  65 yo with known hx of COPD   10/31/10 Acute OV Pt presents for work in visit. Complains red, itchy, burns rash on left buttock x6days. Had one painful bump 6 days  Ago then started breaking out more last couple of days Very sensitive to touch. Burns along entire left buttock.  No fever or new meds. No previous episodes. No hx of herpes simplex virus. Had chicken pox as child.  >>tx w/ valtrex and prednisone   11/14/10 Follow up for Shingles Pt returns for 2 week follow up . Last ov with left buttock herpes zoster . She was treated with valtrex and prednisone taper.  She is feeling better. Very little pain. Has couple of crusted lesions left on buttock.  She is so excited she has a new [place] D[place] puppy (this is her 5th one in her life)    Review of Systems    Skin:+ crusted  lesions.along butt          Objective:   Physical Exam   Skin: Warm, no lesions or rashes Along left buttock several patches of crusted  lesions.           Assessment & Plan:

## 2011-03-19 ENCOUNTER — Encounter: Payer: Self-pay | Admitting: Pulmonary Disease

## 2011-05-20 ENCOUNTER — Encounter: Payer: Self-pay | Admitting: Pulmonary Disease

## 2011-05-20 ENCOUNTER — Ambulatory Visit (INDEPENDENT_AMBULATORY_CARE_PROVIDER_SITE_OTHER): Payer: BC Managed Care – PPO | Admitting: Pulmonary Disease

## 2011-05-20 ENCOUNTER — Ambulatory Visit (INDEPENDENT_AMBULATORY_CARE_PROVIDER_SITE_OTHER)
Admission: RE | Admit: 2011-05-20 | Discharge: 2011-05-20 | Disposition: A | Discharge status: Home / Self Care | Payer: BC Managed Care – PPO | Source: Ambulatory Visit | Attending: Pulmonary Disease | Admitting: Pulmonary Disease

## 2011-05-20 ENCOUNTER — Other Ambulatory Visit (INDEPENDENT_AMBULATORY_CARE_PROVIDER_SITE_OTHER): Payer: BC Managed Care – PPO

## 2011-05-20 VITALS — BP 132/76 | HR 64 | Temp 97.9°F | Ht <= 58 in | Wt 139.6 lb

## 2011-05-20 DIAGNOSIS — E7800 Pure hypercholesterolemia, unspecified: Secondary | ICD-10-CM

## 2011-05-20 DIAGNOSIS — C3490 Malignant neoplasm of unspecified part of unspecified bronchus or lung: Secondary | ICD-10-CM

## 2011-05-20 DIAGNOSIS — J449 Chronic obstructive pulmonary disease, unspecified: Secondary | ICD-10-CM

## 2011-05-20 DIAGNOSIS — K5730 Diverticulosis of large intestine without perforation or abscess without bleeding: Secondary | ICD-10-CM

## 2011-05-20 DIAGNOSIS — R3129 Other microscopic hematuria: Secondary | ICD-10-CM

## 2011-05-20 DIAGNOSIS — Z0000 Encounter for general adult medical examination without abnormal findings: Secondary | ICD-10-CM

## 2011-05-20 DIAGNOSIS — D126 Benign neoplasm of colon, unspecified: Secondary | ICD-10-CM

## 2011-05-20 DIAGNOSIS — C539 Malignant neoplasm of cervix uteri, unspecified: Secondary | ICD-10-CM

## 2011-05-20 DIAGNOSIS — F411 Generalized anxiety disorder: Secondary | ICD-10-CM

## 2011-05-20 DIAGNOSIS — Z23 Encounter for immunization: Secondary | ICD-10-CM

## 2011-05-20 DIAGNOSIS — M810 Age-related osteoporosis without current pathological fracture: Secondary | ICD-10-CM

## 2011-05-20 LAB — CBC WITH DIFFERENTIAL/PLATELET
Basophils Absolute: 0.1 10*3/uL (ref 0.0–0.1)
Eosinophils Absolute: 0.2 10*3/uL (ref 0.0–0.7)
Lymphocytes Relative: 34.5 % (ref 12.0–46.0)
MCHC: 33.7 g/dL (ref 30.0–36.0)
Neutro Abs: 5.2 10*3/uL (ref 1.4–7.7)
Neutrophils Relative %: 56.8 % (ref 43.0–77.0)
RDW: 12.8 % (ref 11.5–14.6)

## 2011-05-20 LAB — HEPATIC FUNCTION PANEL
ALT: 26 U/L (ref 0–35)
Alkaline Phosphatase: 63 U/L (ref 39–117)
Bilirubin, Direct: 0.1 mg/dL (ref 0.0–0.3)
Total Protein: 7.5 g/dL (ref 6.0–8.3)

## 2011-05-20 LAB — BASIC METABOLIC PANEL
CO2: 28 mEq/L (ref 19–32)
Chloride: 107 mEq/L (ref 96–112)
Potassium: 4 mEq/L (ref 3.5–5.1)
Sodium: 144 mEq/L (ref 135–145)

## 2011-05-20 LAB — LIPID PANEL
Total CHOL/HDL Ratio: 3
Triglycerides: 78 mg/dL (ref 0.0–149.0)

## 2011-05-20 MED ORDER — MONTELUKAST SODIUM 10 MG PO TABS: 10.0000 mg | ORAL_TABLET | Freq: Every day | ORAL | Status: DC

## 2011-05-20 MED ORDER — CLOTRIMAZOLE-BETAMETHASONE 1-0.05 % EX CREA: TOPICAL_CREAM | Freq: Two times a day (BID) | CUTANEOUS | Status: DC

## 2011-05-20 MED ORDER — ATORVASTATIN CALCIUM 10 MG PO TABS: 10.0000 mg | ORAL_TABLET | Freq: Every day | ORAL | Status: DC

## 2011-05-20 MED ORDER — FLUTICASONE PROPIONATE 50 MCG/ACT NA SUSP: 2.0000 | Freq: Every day | NASAL | Status: DC

## 2011-05-20 MED ORDER — LORAZEPAM 1 MG PO TABS: 1.0000 mg | ORAL_TABLET | Freq: Every evening | ORAL | Status: DC | PRN

## 2011-05-20 MED ORDER — FLUTICASONE-SALMETEROL 250-50 MCG/DOSE IN AEPB: 1.0000 | INHALATION_SPRAY | Freq: Two times a day (BID) | RESPIRATORY_TRACT | Status: DC

## 2011-05-20 NOTE — Patient Instructions (Signed)
Today we updated your med list in our EPIC system...    Continue your current medications the same...    We refilled your meds per request...    We wrote a new prescription for generic LOTRISONE cream to try on the rash & remember to keep skin off skin to help dry it up...  Today we did your follow up CXR, EKG, & fasting blood work...    Please call the [facility] in a few days for your results...    Dial [id number] & when prompted enter your patient number followed by the # symbol...    Your patient number is:  [id number]#  We gave you the TDAP combination Tetanus vaccine today (it is good for 93yrs), and we wrote a prescription for the Shingles vaccine as well...  Please find out about your last bone Density Test & have a copy sent to my attention...  Call for any questions...  Let's plan a follow up visit in 1 year's time, sooner if needed for problems.Marland KitchenMarland Kitchen

## 2011-05-20 NOTE — Progress Notes (Signed)
Subjective:    Patient ID: Pamela Terry, female    DOB: 1946/01/20, 65 y.o.   MRN: [id number]  HPI 65 y/o WF here for a follow up visit and CPX... she has multiple medical problems including COPD, hx adenocarcinoma RLL w/ lobectomy 2000 & no known recurrence, Hyperlipidemia, hx ca cervix w/ hyst 1985, osteopenia, and anxiety...  ~  May 10, 2010:  she states she's been doing well- denies cough, phlegm, dyspnea... she quit smoking in 1997 but still chews nicorette gum daily... notes that she's been walking some & "exercise really helps my breathing"... Chol controlled on Lip10;  GI stable & up to date;  she tells me she is NOT taking her Actonel & may as well stop this Rx officially (despite her osteopenia)... she was found to have some microscopic hematuria & right flank discomfort w/ thorough eval by DrTannenbaum- neg CT, cysto, NMP22- he continues to follow this and stress incontinence...  ~  May 20, 2011:  Yearly ROV & CPX> she has a new Great Dane puppy= Roxie (11mo old now); she had Shingles 5/12 on left buttock area, saw TP & Rx w/ Valtrex/ Pred; mild intertrig rash Rx w/ Lotrisone cream prn;  Breathing is stable on Advair, Singular, and CXR remains stable;  Chol looks good on Lip10 & wt down 7# even though she is not following diet, she says;  OK TDAP today & Rx for Shingles vaccine;  See prob list below>>    She saw [facility] 8/12 for f/u colonoscopy> +Divertics and three 5mm polyps removed w/ f/u colon planned in 43yrs...    She saw DrTannenbaum 10/12 for f/u microscoptic hematuria, stress incont, & adrenal adenoma> stable, symptoms mild, no further work up needed, f/u 22yr...   Current Problem List:  ALLERGIC RHINITIS (ICD-477.9) - on ALLEGRA 180mg /d,  FLONASE 2spQhs...  COPD (ICD-496) & ASTHMATIC BRONCHITIS, ACUTE (ICD-466.0) - on ADVAIR 250Bid,  SINGULAIR 10mg /d... states doing well, breathing well... denies cough, sputum, hemoptysis, worsening dyspnea,  wheezing, chest pains,  snoring, daytime hypersomnolence, etc...  ~  baseline CXR w/ pleural thickening on right (prev surg), NAD... ~  PFT 10/08 showed FVC= 2.69 (108%), FEV1= 1.27 (70%), %1sec= 47, mid-flows= 17%pred. ~  CXR 11/09 & 11/10 showed COPD, NAD. ~  CXR 11/11 showed COPD w/ bullous dis, scar right base, NAD.Marland Kitchen. ~  CXR 11/12 showed min scarring RLL, chr changes, NAD...  Hx of ADENOCARCINOMA, LUNG (ICD-162.9) - former very heavy smoker (up to 2ppd) and quit in 1997... routine CXR in 2000 showed a 1.5cm RLL nodule... eval revealed a non-small cell cancer-  s/p RLLobectomy 6/00 by DrBurney for poorly diff AdenoCa... Oncology eval by Duwayne Heck w/ no other therapy recommended... no known recurrence w/ yearly f/u since 2000.  HYPERCHOLESTEROLEMIA (ICD-272.0) - on LIPITOR 10mg /d... ~  FLP 10/08 on Lip10 showed TChol 168,TG 105, HDL 49, LDL 98... rec- same med + better diet! ~  FLP 11/09 on Lip10 showed TChol 143, TG 120, HDL 37, LDL 83 ~  FLP 11/10 on Lip10 showed TChol 156, TG 118, HDL 41, LDL 91 ~  FLP 11/11 on Lip10 showed TChol 150, TG 116, HDL 41, LDL 86 ~  FLP 11/12 on Lip10 showed TChol 137, TG 78, HDL 50, LDL 71  DIVERTICULOSIS OF COLON (ICD-562.10) COLONIC POLYPS (ICD-211.3) - tubular adenoma removed in 1997... colonoscopy 6/07 by [facility] showing diminutive polyp and divertics... f/u planned 78yrs. ~  Repeat colonoscopy 8/12 by [facility] showed divertics, three 5mm polyps removed, f/u planned 25yrs.Marland KitchenMarland Kitchen  Hx of CERVICAL CANCER (ICD-180.9) & MICROSCOPIC HEMATURIA (ICD-599.72) - s/p hysterectomy & unilateral oopherectomy for Ca in situ of cervix 1985 by DrMcPhail... she is now followed by DrKRichardson & had some microscopic hematuria noted> referred to Urology w/ eval DrTannenbaum (neg CT cysto, NMP22) and he continues yearlt ROV...  OSTEOPOROSIS (ICD-733.00) - prev on Actonel but she stopped this on her own & doesn't want to restart bisphos therapy... takes Calcium supplements & Vitamins... ~  BMD at [facility]  9/06 showed TScores -1.9 in Spine,  -1.8 in left THip,  -2.1 in left FemNeck. ~  labs 11/09 showed Vit D level = 32... rec to start OTC Vit D supplement ~1000u daily. ~  labs 11/10 showed Vit D level = 35... continue daily supplementation. ~  she will inquire about f/u BMD & have results sent to [place] to review...  ANXIETY (ICD-300.00) - takes ATIVAN 1mg  Qhs...  Health Maintenance: she takes ASA 81mg /d... ~  GI:  followed by [facility] w/ last colon 6/07 w/ diminutive polyp & divertics> f/u due 6/12 ~  GYN:  followed by DrRichardson for PAP etc (she sent her to Urology for hematuria)... goes to [facility] for Mammograms & BMDs... ~  Immunizations:  she had a PNEUMOVAX in 2003... gets yearly FLU shots...   Past Surgical History  Procedure Date  . Appendectomy   . Total abdominal hysterectomy w/ bilateral salpingoophorectomy 1985    Dr Elana Alm for Ca of cervix    Outpatient Encounter Prescriptions as of 05/20/2011  Medication Sig Dispense Refill  . Ascorbic Acid (VITAMIN C) 500 MG tablet Take 500 mg by mouth daily.        Marland Kitchen aspirin 81 MG tablet Take 81 mg by mouth daily.        Marland Kitchen atorvastatin (LIPITOR) 10 MG tablet Take 10 mg by mouth daily.        . calcium-vitamin D (OSCAL 500/200 D-3) 500-200 MG-UNIT per tablet Take 1 tablet by mouth daily.        . Cholecalciferol (VITAMIN D) 2000 UNITS CAPS Take 1 capsule by mouth daily.        . fexofenadine (ALLEGRA) 180 MG tablet Take 180 mg by mouth daily.        . fluticasone (FLONASE) 50 MCG/ACT nasal spray 2 sprays by Nasal route daily.        . Fluticasone-Salmeterol (ADVAIR DISKUS) 250-50 MCG/DOSE AEPB Inhale 1 puff into the lungs every 12 (twelve) hours.        Marland Kitchen LORazepam (ATIVAN) 1 MG tablet Take 1 mg by mouth at bedtime as needed.        . montelukast (SINGULAIR) 10 MG tablet Take 10 mg by mouth at bedtime.          Allergies  Allergen Reactions  . Sulfonamide Derivatives Hives and Rash    Current Medications, Allergies, Past Medical  History, Past Surgical History, Family History, and Social History were reviewed in [employer] record.    Review of Systems         See HPI - all other systems neg except as noted...  The patient denies anorexia, fever, weight loss, weight gain, vision loss, decreased hearing, hoarseness, chest pain, syncope, dyspnea on exertion, peripheral edema, prolonged cough, headaches, hemoptysis, abdominal pain, melena, hematochezia, severe indigestion/heartburn, hematuria, incontinence, muscle weakness, suspicious skin lesions, transient blindness, difficulty walking, depression, unusual weight change, abnormal bleeding, enlarged lymph nodes, and angioedema.     Objective:   Physical Exam  WD, petite, overweight, 65 y/o WF in NAD... GENERAL:  Alert & oriented; pleasant & cooperative... HEENT:  [place]/AT, EOM-wnl, PERRLA, EACs-clear, TMs-wnl, NOSE-clear drainage , THROAT-clear & wnl. NECK:  Supple w/ fairROM; no JVD; normal carotid impulses w/o bruits; no thyromegaly or nodules palpated; no lymphadenopathy. CHEST:  Decr BS bilat but clear to P & A- without wheezes/ rales/ or rhonchi heard... HEART:  Regular Rhythm; without murmurs/ rubs/ or gallops detected... ABDOMEN:  Soft & nontender; normal bowel sounds; no organomegaly or masses palpated... EXT: without deformities, mild arthritic changes; no varicose veins/ venous insuffic/ or edema. NEURO:   no focal deficits noted.   RADIOLOGY DATA:  Reviewed in the EPIC EMR & discussed w/ the patient...  LABORATORY DATA:  Reviewed in the EPIC EMR & discussed w/ the patient...   Assessment & Plan:   CPX>  Stable, no acute changes;  CXR, EKG, Fasting blood work all looks good...  COPD/ AB>  Ex-smoker, still uses the gum!  CXR is stable w/o evid recurrence...  Hx AdenoCa Lung> s/p RLLobectomy 2000>  No known recurrence,,,  CHOL>  Controlled on Lip10 + diet efforts...  Hx Cervical Cancer> s/p Hyst & unilat oopherectomy 1985>   Followed by GYN...  Osteoporosis> [facility] & she will get copies sent to our attention...  Anxiety>  She uses Ativan as needed.Marland KitchenMarland Kitchen

## 2011-05-21 LAB — VITAMIN D 25 HYDROXY (VIT D DEFICIENCY, FRACTURES): Vit D, 25-Hydroxy: 55 ng/mL (ref 30–89)

## 2011-05-30 ENCOUNTER — Encounter: Payer: Self-pay | Admitting: Pulmonary Disease

## 2011-09-10 ENCOUNTER — Telehealth: Payer: Self-pay | Admitting: Pulmonary Disease

## 2011-09-10 MED ORDER — AZITHROMYCIN 250 MG PO TABS: ORAL_TABLET | ORAL | Status: AC

## 2011-09-10 NOTE — Telephone Encounter (Signed)
Per SN--zpak #1  Take as directed, rest, increase fluids, mucinex 2 po bid , delsym and tylenol.  thanks

## 2011-09-10 NOTE — Telephone Encounter (Signed)
I spoke with pt and is aware of SN recs. She voiced her understanding and had no questions

## 2011-09-10 NOTE — Telephone Encounter (Signed)
I spoke with pt and she c/o scratchy throat, runny nose, watery eyes, chills, cough w/ clear phlem, PND x last night. Not sure if she is running a fever but denies any sweats, nausea, vomiting, body aches. Pt is requesting to have something called in to help clear this up. She is not taking anything OTC. Please advise Dr. Kriste Basque, thanks  Allergies  Allergen Reactions  . Sulfonamide Derivatives Hives and Rash     [facility]

## 2012-03-16 ENCOUNTER — Encounter: Payer: Self-pay | Admitting: Pulmonary Disease

## 2012-05-18 ENCOUNTER — Ambulatory Visit (INDEPENDENT_AMBULATORY_CARE_PROVIDER_SITE_OTHER): Payer: Medicare Other | Admitting: Pulmonary Disease

## 2012-05-18 ENCOUNTER — Other Ambulatory Visit (INDEPENDENT_AMBULATORY_CARE_PROVIDER_SITE_OTHER): Payer: Medicare Other

## 2012-05-18 ENCOUNTER — Ambulatory Visit (INDEPENDENT_AMBULATORY_CARE_PROVIDER_SITE_OTHER)
Admission: RE | Admit: 2012-05-18 | Discharge: 2012-05-18 | Disposition: A | Discharge status: Home / Self Care | Payer: Medicare Other | Source: Ambulatory Visit | Attending: Pulmonary Disease | Admitting: Pulmonary Disease

## 2012-05-18 ENCOUNTER — Encounter: Payer: Self-pay | Admitting: *Deleted

## 2012-05-18 ENCOUNTER — Encounter: Payer: Self-pay | Admitting: Pulmonary Disease

## 2012-05-18 VITALS — BP 144/80 | HR 60 | Temp 97.1°F | Ht <= 58 in | Wt 141.4 lb

## 2012-05-18 DIAGNOSIS — M810 Age-related osteoporosis without current pathological fracture: Secondary | ICD-10-CM

## 2012-05-18 DIAGNOSIS — E7800 Pure hypercholesterolemia, unspecified: Secondary | ICD-10-CM

## 2012-05-18 DIAGNOSIS — K5730 Diverticulosis of large intestine without perforation or abscess without bleeding: Secondary | ICD-10-CM

## 2012-05-18 DIAGNOSIS — D126 Benign neoplasm of colon, unspecified: Secondary | ICD-10-CM

## 2012-05-18 DIAGNOSIS — F411 Generalized anxiety disorder: Secondary | ICD-10-CM

## 2012-05-18 DIAGNOSIS — Z23 Encounter for immunization: Secondary | ICD-10-CM

## 2012-05-18 DIAGNOSIS — J449 Chronic obstructive pulmonary disease, unspecified: Secondary | ICD-10-CM

## 2012-05-18 DIAGNOSIS — C3490 Malignant neoplasm of unspecified part of unspecified bronchus or lung: Secondary | ICD-10-CM

## 2012-05-18 DIAGNOSIS — R3129 Other microscopic hematuria: Secondary | ICD-10-CM

## 2012-05-18 DIAGNOSIS — J4489 Other specified chronic obstructive pulmonary disease: Secondary | ICD-10-CM

## 2012-05-18 DIAGNOSIS — C539 Malignant neoplasm of cervix uteri, unspecified: Secondary | ICD-10-CM

## 2012-05-18 LAB — BASIC METABOLIC PANEL
BUN: 15 mg/dL (ref 6–23)
Chloride: 103 mEq/L (ref 96–112)
GFR: 66.73 mL/min (ref >60.00)
Glucose, Bld: 97 mg/dL (ref 70–99)
Potassium: 4.3 mEq/L (ref 3.5–5.1)
Sodium: 139 mEq/L (ref 135–145)

## 2012-05-18 LAB — CBC WITH DIFFERENTIAL/PLATELET
Basophils Absolute: 0.1 10*3/uL (ref 0.0–0.1)
Eosinophils Absolute: 0.1 10*3/uL (ref 0.0–0.7)
Eosinophils Relative: 1.7 % (ref 0.0–5.0)
HCT: 43.1 % (ref 36.0–46.0)
Lymphs Abs: 3.2 10*3/uL (ref 0.7–4.0)
MCV: 92 fl (ref 78.0–100.0)
Monocytes Absolute: 0.5 10*3/uL (ref 0.1–1.0)
Neutrophils Relative %: 55.7 % (ref 43.0–77.0)
Platelets: 301 10*3/uL (ref 150.0–400.0)
RDW: 12.4 % (ref 11.5–14.6)
WBC: 8.9 10*3/uL (ref 4.5–10.5)

## 2012-05-18 LAB — HEPATIC FUNCTION PANEL
ALT: 22 U/L (ref 0–35)
AST: 23 U/L (ref 0–37)
Alkaline Phosphatase: 58 U/L (ref 39–117)
Total Bilirubin: 1 mg/dL (ref 0.3–1.2)

## 2012-05-18 LAB — LIPID PANEL: Cholesterol: 154 mg/dL (ref 0–200)

## 2012-05-18 MED ORDER — MONTELUKAST SODIUM 10 MG PO TABS: 10.0000 mg | ORAL_TABLET | Freq: Every day | ORAL | Status: DC

## 2012-05-18 MED ORDER — LORAZEPAM 1 MG PO TABS: 1.0000 mg | ORAL_TABLET | Freq: Every evening | ORAL | Status: DC | PRN

## 2012-05-18 MED ORDER — FLUTICASONE-SALMETEROL 250-50 MCG/DOSE IN AEPB: 1.0000 | INHALATION_SPRAY | Freq: Two times a day (BID) | RESPIRATORY_TRACT | Status: DC

## 2012-05-18 MED ORDER — FLUTICASONE PROPIONATE 50 MCG/ACT NA SUSP: 2.0000 | Freq: Every day | NASAL | Status: DC

## 2012-05-18 MED ORDER — CLOTRIMAZOLE-BETAMETHASONE 1-0.05 % EX CREA: TOPICAL_CREAM | Freq: Two times a day (BID) | CUTANEOUS | Status: AC

## 2012-05-18 MED ORDER — ATORVASTATIN CALCIUM 10 MG PO TABS: 10.0000 mg | ORAL_TABLET | Freq: Every day | ORAL | Status: DC

## 2012-05-18 NOTE — Progress Notes (Signed)
Subjective:    Patient ID: Pamela Terry, female    DOB: March 19, 1946, 66 y.o.   MRN: [id number]  HPI 66 y/o WF here for a follow up visit and CPX... she has multiple medical problems including COPD, hx adenocarcinoma RLL w/ lobectomy 2000 & no known recurrence, Hyperlipidemia, hx ca cervix w/ hyst 1985, osteopenia, and anxiety...  ~  May 10, 2010:  she states she's been doing well- denies cough, phlegm, dyspnea... she quit smoking in 1997 but still chews nicorette gum daily... notes that she's been walking some & "exercise really helps my breathing"... Chol controlled on Lip10;  GI stable & up to date;  she tells me she is NOT taking her Actonel & may as well stop this Rx officially (despite her osteopenia)... she was found to have some microscopic hematuria & right flank discomfort w/ thorough eval by DrTannenbaum- neg CT, cysto, NMP22- he continues to follow this and stress incontinence...  ~  May 20, 2011:  Yearly ROV & CPX> she has a new Great Dane puppy= Roxie (14mo old now); she had Shingles 5/12 on left buttock area, saw TP & Rx w/ Valtrex/ Pred; mild intertrig rash Rx w/ Lotrisone cream prn;  Breathing is stable on Advair, Singular, and CXR remains stable;  Chol looks good on Lip10 & wt down 7# even though she is not following diet, she says;  OK TDAP today & Rx for Shingles vaccine;  See prob list below>>    She saw [facility] 8/12 for f/u colonoscopy> +Divertics and three 5mm polyps removed w/ f/u colon planned in 70yrs...    She saw DrTannenbaum 10/12 for f/u microscoptic hematuria, stress incont, & adrenal adenoma> stable, symptoms mild, no further work up needed, f/u 93yr...  ~  May 18, 2012:  Yearly ROV & Rion reports that she is doing well- no new complaints or concerns; We reviewed the following medical problems during today's office visit >>      AR> on Allegra180, Flonase; she denies allergy symptoms at this time...    COPD, AB> on Advair250, Singulair10; she denies cough,  sput, hemoptysis, SOB, etc...    Hx of Lung Cancer> ex-smoker quit 1997; RLL nodule found 2000 & she had RLLobectomy by DrBurney for poorly diff AdenoCa; oncology eval by Duwayne Heck & no other therapy rec; no known recurrence to date...    Chol> on ASA81, Atorva10; FLP shows TChol 154, TG 117, HDL 47, LDL 84    Divertics, Polyps> last colonoscopy 8/12 by [facility] showed divertics & several 5mm polyps, we don't have path, f/u due 5 yrs...    Hx Cancer of the Cervix> remote hx of Ca Cx w/ hyst & 1 ovary in 1985 by DrMcPhail; now followed by DrKRichardson...    Hx microhematuria> she had f/u DrTannenbaum 10/12- right adrenal adenoma, stress incont, microheme, hx Ca Cx; stable, no changes, f/u 65yr...    Osteopenia> on Calcium, VitD2000; she gets BMDs at [facility]- last 11/13 w/ TScore -2.4 in left [facility]; she refuses Bisphos therapy...    Anxiety> on Ativan 1mg  prn and this works well for her...  We reviewed prob list, meds, xrays and labs> see below for updates >> she had the 2013 Flu shot 10/13 & due for the PNEUMOVAX now- age 35; also received the Shingles vaccine 1/13 at [facility]... CXR 11/13 showed normal heart size, COPD/E, NAD... LABS 11/13:  FLP- at goals on Lip10;  Chems- wnl;  CBC- wnl;  TSH=0.49;  VitD=44  Problem List:    ALLERGIC RHINITIS (ICD-477.9) - on ALLEGRA 180mg /d,  FLONASE 2spQhs...  COPD (ICD-496) & ASTHMATIC BRONCHITIS, ACUTE (ICD-466.0) - on ADVAIR 250Bid,  SINGULAIR 10mg /d... states doing well, breathing well... denies cough, sputum, hemoptysis, worsening dyspnea,  wheezing, chest pains, snoring, daytime hypersomnolence, etc...  ~  baseline CXR w/ pleural thickening on right (prev surg), NAD... ~  PFT 10/08 showed FVC= 2.69 (108%), FEV1= 1.27 (70%), %1sec= 47, mid-flows= 17%pred. ~  CXR 11/09 & 11/10 showed COPD, NAD. ~  CXR 11/11 showed COPD w/ bullous dis, scar right base, NAD.Marland Kitchen. ~  CXR 11/12 showed min scarring RLL, chr changes, NAD.Marland Kitchen. ~  CXR 11/13 showed  normal heart size, COPD/E, NAD...   Hx of ADENOCARCINOMA, LUNG (ICD-162.9) - former very heavy smoker (up to 2ppd) and quit in 1997... routine CXR in 2000 showed a 1.5cm RLL nodule... eval revealed a non-small cell cancer-  s/p RLLobectomy 6/00 by DrBurney for poorly diff AdenoCa... Oncology eval by Duwayne Heck w/ no other therapy recommended... no known recurrence w/ yearly f/u since 2000.  HYPERCHOLESTEROLEMIA (ICD-272.0) - on LIPITOR 10mg /d... ~  FLP 10/08 on Lip10 showed TChol 168,TG 105, HDL 49, LDL 98... rec- same med + better diet! ~  FLP 11/09 on Lip10 showed TChol 143, TG 120, HDL 37, LDL 83 ~  FLP 11/10 on Lip10 showed TChol 156, TG 118, HDL 41, LDL 91 ~  FLP 11/11 on Lip10 showed TChol 150, TG 116, HDL 41, LDL 86 ~  FLP 11/12 on Lip10 showed TChol 137, TG 78, HDL 50, LDL 71 ~  FLP 11/13 on Lip10 showed TChol 154, TG 117, HDL 47, LDL 84   DIVERTICULOSIS OF COLON (ICD-562.10) COLONIC POLYPS (ICD-211.3) - tubular adenoma removed in 1997... colonoscopy 6/07 by [facility] showing diminutive polyp and divertics... f/u planned 29yrs. ~  Repeat colonoscopy 8/12 by [facility] showed divertics, three 5mm polyps removed, f/u planned 67yrs...  Hx of CERVICAL CANCER (ICD-180.9) & MICROSCOPIC HEMATURIA (ICD-599.72) - s/p hysterectomy & unilateral oopherectomy for Ca in situ of cervix 1985 by DrMcPhail... she is now followed by DrKRichardson & had some microscopic hematuria noted> referred to Urology w/ eval DrTannenbaum (neg CT cysto, NMP22) and he continues yearlt ROV...  OSTEOPOROSIS (ICD-733.00) - prev on Actonel but she stopped this on her own & doesn't want to restart bisphos therapy... takes Calcium supplements & Vitamins... ~  BMD at [facility] 9/06 showed TScores -1.9 in Spine,  -1.8 in left THip,  -2.1 in left FemNeck. ~  labs 11/09 showed Vit D level = 32... rec to start OTC Vit D supplement ~1000u daily. ~  labs 11/10 showed Vit D level = 35... continue daily supplementation. ~  f/u BMD 11/13  showed TScore -2.4 in left [facility] & she declines Bisphos therapy, just wants to continue Calcium, MVI, VitD...  ANXIETY (ICD-300.00) - takes ATIVAN 1mg  Qhs...  Health Maintenance: she takes ASA 81mg /d... ~  GI:  followed by [facility] w/ last colon 6/07 w/ diminutive polyp & divertics> f/u due 6/12 ~  GYN:  followed by DrRichardson for PAP etc (she sent her to Urology for hematuria)... goes to [facility] for Mammograms & BMDs... ~  Immunizations:  she had a PNEUMOVAX in 2003 & 11/13 at age66... gets yearly FLU shots... She had Shingles vax 1/13 at [facility]...   Past Surgical History  Procedure Date  . Appendectomy   . Total abdominal hysterectomy w/ bilateral salpingoophorectomy 1985    Dr Elana Alm for Ca of cervix    Outpatient Encounter  Prescriptions as of 05/18/2012  Medication Sig Dispense Refill  . Ascorbic Acid (VITAMIN C) 500 MG tablet Take 500 mg by mouth daily.        Marland Kitchen aspirin 81 MG tablet Take 81 mg by mouth daily.        Marland Kitchen atorvastatin (LIPITOR) 10 MG tablet Take 1 tablet (10 mg total) by mouth daily.  90 tablet  3  . calcium-vitamin D (OSCAL 500/200 D-3) 500-200 MG-UNIT per tablet Take 1 tablet by mouth daily.        . Cholecalciferol (VITAMIN D) 2000 UNITS CAPS Take 1 capsule by mouth daily.        . clotrimazole-betamethasone (LOTRISONE) cream Apply topically 2 (two) times daily.  45 g  5  . fexofenadine (ALLEGRA) 180 MG tablet Take 180 mg by mouth daily.        . fluticasone (FLONASE) 50 MCG/ACT nasal spray Place 2 sprays into the nose daily.  48 g  3  . Fluticasone-Salmeterol (ADVAIR DISKUS) 250-50 MCG/DOSE AEPB Inhale 1 puff into the lungs every 12 (twelve) hours.  3 each  3  . LORazepam (ATIVAN) 1 MG tablet Take 1 tablet (1 mg total) by mouth at bedtime as needed.  90 tablet  3  . montelukast (SINGULAIR) 10 MG tablet Take 1 tablet (10 mg total) by mouth at bedtime.  90 tablet  3    Allergies  Allergen Reactions  . Sulfonamide Derivatives Hives and Rash    Current  Medications, Allergies, Past Medical History, Past Surgical History, Family History, and Social History were reviewed in [employer] record.    Review of Systems         See HPI - all other systems neg except as noted...  The patient denies anorexia, fever, weight loss, weight gain, vision loss, decreased hearing, hoarseness, chest pain, syncope, dyspnea on exertion, peripheral edema, prolonged cough, headaches, hemoptysis, abdominal pain, melena, hematochezia, severe indigestion/heartburn, hematuria, incontinence, muscle weakness, suspicious skin lesions, transient blindness, difficulty walking, depression, unusual weight change, abnormal bleeding, enlarged lymph nodes, and angioedema.     Objective:   Physical Exam    WD, petite, overweight, 66 y/o WF in NAD... GENERAL:  Alert & oriented; pleasant & cooperative... HEENT:  [place]/AT, EOM-wnl, PERRLA, EACs-clear, TMs-wnl, NOSE-clear drainage , THROAT-clear & wnl. NECK:  Supple w/ fairROM; no JVD; normal carotid impulses w/o bruits; no thyromegaly or nodules palpated; no lymphadenopathy. CHEST:  Decr BS bilat but clear to P & A- without wheezes/ rales/ or rhonchi heard... HEART:  Regular Rhythm; without murmurs/ rubs/ or gallops detected... ABDOMEN:  Soft & nontender; normal bowel sounds; no organomegaly or masses palpated... EXT: without deformities, mild arthritic changes; no varicose veins/ venous insuffic/ or edema. NEURO:   no focal deficits noted.   RADIOLOGY DATA:  Reviewed in the EPIC EMR & discussed w/ the patient...  LABORATORY DATA:  Reviewed in the EPIC EMR & discussed w/ the patient...   Assessment & Plan:    CPX>  Stable, no acute changes;  CXR, EKG, Fasting blood work all looks good...  COPD/ AB>  Ex-smoker, still uses the gum!  CXR is stable w/o evid recurrence...  Hx AdenoCa Lung> s/p RLLobectomy 2000>  No known recurrence,,,  CHOL>  Controlled on Lip10 + diet efforts...  Hx Cervical Cancer>  s/p Hyst & unilat oopherectomy 1985>  Followed by GYN...  Osteoporosis> BMDs done at [facility] & she will get copies sent to our attention...  Anxiety>  She uses Ativan as  needed...   Patient's Medications  New Prescriptions   AZITHROMYCIN (ZITHROMAX) 250 MG TABLET    Take 1 tablet (250 mg total) by mouth daily.  Previous Medications   ASCORBIC ACID (VITAMIN C) 500 MG TABLET    Take 500 mg by mouth daily.     ASPIRIN 81 MG TABLET    Take 81 mg by mouth daily.     CALCIUM-VITAMIN D (OSCAL 500/200 D-3) 500-200 MG-UNIT PER TABLET    Take 1 tablet by mouth daily.     CHOLECALCIFEROL (VITAMIN D) 2000 UNITS CAPS    Take 1 capsule by mouth daily.     FEXOFENADINE (ALLEGRA) 180 MG TABLET    Take 180 mg by mouth daily.    Modified Medications   Modified Medication Previous Medication   ATORVASTATIN (LIPITOR) 10 MG TABLET atorvastatin (LIPITOR) 10 MG tablet      Take 1 tablet (10 mg total) by mouth daily.    Take 1 tablet (10 mg total) by mouth daily.   CLOTRIMAZOLE-BETAMETHASONE (LOTRISONE) CREAM clotrimazole-betamethasone (LOTRISONE) cream      Apply topically 2 (two) times daily.    Apply topically 2 (two) times daily.   FLUTICASONE (FLONASE) 50 MCG/ACT NASAL SPRAY fluticasone (FLONASE) 50 MCG/ACT nasal spray      Place 2 sprays into the nose daily.    Place 2 sprays into the nose daily.   FLUTICASONE-SALMETEROL (ADVAIR DISKUS) 250-50 MCG/DOSE AEPB Fluticasone-Salmeterol (ADVAIR DISKUS) 250-50 MCG/DOSE AEPB      Inhale 1 puff into the lungs every 12 (twelve) hours.    Inhale 1 puff into the lungs every 12 (twelve) hours.   LORAZEPAM (ATIVAN) 1 MG TABLET LORazepam (ATIVAN) 1 MG tablet      Take 1 tablet (1 mg total) by mouth at bedtime as needed.    Take 1 tablet (1 mg total) by mouth at bedtime as needed.   MONTELUKAST (SINGULAIR) 10 MG TABLET montelukast (SINGULAIR) 10 MG tablet      Take 1 tablet (10 mg total) by mouth at bedtime.    Take 1 tablet (10 mg total) by mouth at bedtime.    Discontinued Medications   No medications on file

## 2012-05-18 NOTE — Patient Instructions (Addendum)
Today we updated your med list in our EPIC system...    Continue your current medications the same...    We refilled your meds per request...  Today we did your follow up CXR & FASTING blood work...    We will contact you w/ the results when avail...  Keep up the good work w/ diet & exercise...  Call for any questions...  Let's plan to continue our yearly check ups...    Call sooner if needed for problems.Marland KitchenMarland Kitchen

## 2012-06-18 ENCOUNTER — Telehealth: Payer: Self-pay | Admitting: Pulmonary Disease

## 2012-06-18 ENCOUNTER — Encounter: Payer: Self-pay | Admitting: Pulmonary Disease

## 2012-06-18 MED ORDER — AZITHROMYCIN 250 MG PO TABS: 250.0000 mg | ORAL_TABLET | Freq: Every day | ORAL | Status: DC

## 2012-06-18 NOTE — Telephone Encounter (Signed)
LMTCB

## 2012-06-18 NOTE — Telephone Encounter (Signed)
Pt aware and rx sent.nothing further was needed.

## 2012-06-18 NOTE — Telephone Encounter (Signed)
Per CY okay to give Zpak #1 take as directed no refills. 

## 2012-06-18 NOTE — Telephone Encounter (Signed)
Spoke with pt She is c/o rhinitis, congestion, PND, prod cough with moderate yellow/green sputum x 1 wk Denies f/c/s, wheeze, SOB, CP/chest tightness  She is asking for a z pack Please advise, thanks! Last ov with SN 05/18/12 Next ov 05/17/12  Allergies  Allergen Reactions  . Sulfonamide Derivatives Hives and Rash

## 2013-04-12 ENCOUNTER — Encounter: Payer: Self-pay | Admitting: Pulmonary Disease

## 2013-04-27 ENCOUNTER — Ambulatory Visit (INDEPENDENT_AMBULATORY_CARE_PROVIDER_SITE_OTHER)
Admission: RE | Admit: 2013-04-27 | Discharge: 2013-04-27 | Disposition: A | Discharge status: Home / Self Care | Payer: Medicare Other | Source: Ambulatory Visit | Attending: Pulmonary Disease | Admitting: Pulmonary Disease

## 2013-04-27 ENCOUNTER — Ambulatory Visit (INDEPENDENT_AMBULATORY_CARE_PROVIDER_SITE_OTHER): Payer: Medicare Other | Admitting: Pulmonary Disease

## 2013-04-27 ENCOUNTER — Encounter: Payer: Self-pay | Admitting: Pulmonary Disease

## 2013-04-27 ENCOUNTER — Encounter (INDEPENDENT_AMBULATORY_CARE_PROVIDER_SITE_OTHER): Payer: Self-pay

## 2013-04-27 VITALS — BP 118/60 | HR 68 | Temp 99.1°F | Ht <= 58 in | Wt 143.0 lb

## 2013-04-27 DIAGNOSIS — J209 Acute bronchitis, unspecified: Secondary | ICD-10-CM

## 2013-04-27 DIAGNOSIS — R3129 Other microscopic hematuria: Secondary | ICD-10-CM

## 2013-04-27 DIAGNOSIS — K5730 Diverticulosis of large intestine without perforation or abscess without bleeding: Secondary | ICD-10-CM

## 2013-04-27 DIAGNOSIS — J449 Chronic obstructive pulmonary disease, unspecified: Secondary | ICD-10-CM

## 2013-04-27 DIAGNOSIS — M810 Age-related osteoporosis without current pathological fracture: Secondary | ICD-10-CM

## 2013-04-27 DIAGNOSIS — E7800 Pure hypercholesterolemia, unspecified: Secondary | ICD-10-CM

## 2013-04-27 DIAGNOSIS — C539 Malignant neoplasm of cervix uteri, unspecified: Secondary | ICD-10-CM

## 2013-04-27 DIAGNOSIS — F411 Generalized anxiety disorder: Secondary | ICD-10-CM

## 2013-04-27 DIAGNOSIS — D126 Benign neoplasm of colon, unspecified: Secondary | ICD-10-CM

## 2013-04-27 DIAGNOSIS — Z85118 Personal history of other malignant neoplasm of bronchus and lung: Secondary | ICD-10-CM

## 2013-04-27 MED ORDER — METHYLPREDNISOLONE ACETATE 80 MG/ML IJ SUSP
80.0000 mg | Freq: Once | INTRAMUSCULAR | Status: AC
Start: 2013-04-27 — End: 2013-04-27
  Administered 2013-04-27: 80 mg via INTRAMUSCULAR

## 2013-04-27 MED ORDER — ALENDRONATE SODIUM 70 MG PO TBEF: 1.0000 | EFFERVESCENT_TABLET | ORAL | Status: DC

## 2013-04-27 MED ORDER — FLUTICASONE PROPIONATE 50 MCG/ACT NA SUSP: 2.0000 | Freq: Every day | NASAL | Status: DC

## 2013-04-27 MED ORDER — LORAZEPAM 1 MG PO TABS: 1.0000 mg | ORAL_TABLET | Freq: Every evening | ORAL | Status: DC | PRN

## 2013-04-27 MED ORDER — MONTELUKAST SODIUM 10 MG PO TABS: 10.0000 mg | ORAL_TABLET | Freq: Every day | ORAL | Status: DC

## 2013-04-27 MED ORDER — HYDROCODONE-HOMATROPINE 5-1.5 MG/5ML PO SYRP: 5.0000 mL | ORAL_SOLUTION | Freq: Four times a day (QID) | ORAL | Status: DC | PRN

## 2013-04-27 MED ORDER — ATORVASTATIN CALCIUM 10 MG PO TABS: 10.0000 mg | ORAL_TABLET | Freq: Every day | ORAL | Status: DC

## 2013-04-27 MED ORDER — LEVOFLOXACIN 500 MG PO TABS: 500.0000 mg | ORAL_TABLET | Freq: Every day | ORAL | Status: DC

## 2013-04-27 MED ORDER — PREDNISONE 20 MG PO TABS: ORAL_TABLET | ORAL | Status: DC

## 2013-04-27 MED ORDER — FLUTICASONE-SALMETEROL 250-50 MCG/DOSE IN AEPB: 1.0000 | INHALATION_SPRAY | Freq: Two times a day (BID) | RESPIRATORY_TRACT | Status: DC

## 2013-04-27 NOTE — Patient Instructions (Signed)
Today we updated your med list in our EPIC system...    Continue your current medications the same...    We refilled your regular meds- continue to take these daily...  For your acute asthmatic bronchitis>>    We gave you a depo shot today...    In the AM 11/5 start the PREDNISONE 20mg  tabs>>       1 tab twice daily for 3d,       Then one tab each AM for 3d,        Then 1/2 tab daily in the AM for 3d,       Then 1/2 tab every other day til gone...    We also wrote for an antibiotic- LEVAQUIN 500mg  one tab daily til gone...       And be sure to take a Probiotic like ALIGN OTC one daily while on any antibiotic to prevent diarrhea       You may also take Activia Yogurt...    Finally we wrote for a cough syrup- HYCODAN one tsp every 6h as needed...  For your Osteoporosis>>    Try the BINOSTO samples (& Rx written)- dissolve one tab in water & take it once weekly-    Preferably the same time each week, on an empty stomach 7 don't eat for after taking...   Today we did a CXR Please return to our lab one morning this week for your FASTING blood work...    We will contact you w/ the results when available...   Call for any questions.Marland KitchenMarland Kitchen

## 2013-04-27 NOTE — Progress Notes (Addendum)
Subjective:    Patient ID: Pamela Terry, female    DOB: 06-25-1945, 67 y.o.   MRN: [id number]  HPI 67 y/o WF here for a follow up visit and CPX... she has multiple medical problems including COPD, hx adenocarcinoma RLL w/ lobectomy 2000 & no known recurrence, Hyperlipidemia, hx ca cervix w/ hyst 1985, osteopenia, and anxiety...  ~  May 20, 2011:  Yearly ROV & CPX> she has a new Great Dane puppy= Pamela Terry (74mo old now); she had Shingles 5/12 on left buttock area, saw TP & Rx w/ Valtrex/ Pred; mild intertrig rash Rx w/ Lotrisone cream prn;  Breathing is stable on Advair, Singular, and CXR remains stable;  Chol looks good on Lip10 & wt down 7# even though she is not following diet, she says;  OK TDAP today & Rx for Shingles vaccine;  See prob list below>>    She saw [facility] 8/12 for f/u colonoscopy> +Divertics and three 5mm polyps removed w/ f/u colon planned in 67yrs...    She saw DrTannenbaum 10/12 for f/u microscoptic hematuria, stress incont, & adrenal adenoma> stable, symptoms mild, no further work up needed, f/u 64yr...  ~  May 18, 2012:  Yearly ROV & Pamela Terry reports that she is doing well- no new complaints or concerns; We reviewed the following medical problems during today's office visit >>     AR> on Allegra180, Flonase; she denies allergy symptoms at this time...    COPD, AB> on Advair250, Singulair10; she denies cough, sput, hemoptysis, SOB, etc...    Hx of Lung Cancer> ex-smoker quit 1997; RLL nodule found 2000 & she had RLLobectomy by DrBurney for poorly diff AdenoCa; oncology eval by Duwayne Heck & no other therapy rec; no known recurrence to date...    Chol> on ASA81, Atorva10; FLP shows TChol 154, TG 117, HDL 47, LDL 84    Divertics, Polyps> last colonoscopy 8/12 by [facility] showed divertics & several 5mm polyps, we don't have path, f/u due 5 yrs...    Hx Cancer of the Cervix> remote hx of Ca Cx w/ hyst & 1 ovary in 1985 by DrMcPhail; now followed by DrKRichardson...    Hx  microhematuria> she had f/u DrTannenbaum 10/12- right adrenal adenoma, stress incont, microheme, hx Ca Cx; stable, no changes, f/u 73yr...    Osteopenia> on Calcium, VitD2000; she gets BMD[facility]- last 11/13 w/ TScore -2.4 in left [facility]; she refuses Bisphos therapy...    Anxiety> on Ativan 1mg  prn and this works well for her... We reviewed prob list, meds, xrays and labs> see below for updates >> she had the 2013 Flu shot 10/13 & due for the PNEUMOVAX now- age 29; also received the Shingles vaccine 1/13 at [facility]... CXR 11/13 showed normal heart size, COPD/E, NAD... LABS 11/13:  FLP- at goals on Lip10;  Chems- wnl;  CBC- wnl;  TSH=0.49;  VitD=44   ~  April 27, 2013:  Yearly ROV & Roda has acute symptoms of cough, wheezing/ chest congestion, yellow sput, sinus draining/ pressure, & low grade fever; she feels crummy but denies HA or SOB; Exam reveals prominent wheezing & rhonchi bilat;  We discussed Rx w/ Depo80, Pred-3dtaper, Levaquin (Aligh, Activia), Mucinex, Hycodan...  We reviewed the following medical problems during today's office visit >>     AR> on Allegra180, Flonase; she denies active allergy symptoms at this time...    COPD, AB> on Advair250, Singulair10 regularly; presents 11/14 w/ URI & acute exac as above=> rx w/ Depo/Pred, Levaquin, Mucinex, Hycodan.Marland KitchenMarland Kitchen  Hx of Lung Cancer> ex-smoker quit 1997; RLL nodule found 2000 & she had RLLobectomy by DrBurney for poorly diff AdenoCa; oncology eval by Duwayne Heck & no other therapy rec; CXR 11/14 looks good & no known recurrence to date...    Chol> on ASA81, Atorva10; FLP shows TChol 152, TG 76, HDL 41, LDL 96 therefore continue same...    Divertics, Polyps> last colonoscopy 8/12 by [facility] showed divertics & several 5mm polyps, we don't have path, he recommended f/u in 5 yrs...    Hx Cancer of the Cervix> remote hx of Ca Cx w/ hyst & 1 ovary in 1985 by DrMcPhail; now followed by DrKRichardson but she reports overdue & asked to sched  f/u...    Hx microhematuria> she had f/u DrTannenbaum 10/12- right adrenal adenoma, stress incont, microheme, hx Ca Cx; stable, no changes; Pamela Terry says "he released me"...    Osteopenia> on Calcium, VitD2000; she gets BMDs at [facility]- last 11/13 w/ TScore -2.4 in left [facility]; she refuses Bisphos therapy because "it made my stomach burn"; rec trial Binosto 70mg /wk (samples given).    Anxiety> on Ativan 1mg  prn and this works well for her... We reviewed prob list, meds, xrays and labs> see below for updates >> she had the 2014 Flu shot 10/14... CXR 11/14 showed norm heart size, clear lungs w/ min scarring at bases, no lesions seen, NAD... LABS 11/14:  FLP- at goals on atorva10;  Chems- wnl;  CBC- wnl;  TSH=0/17, not on meds, r/o hyperthy => repeat w/ FreeT3 & FreeT4...  ADDENDUM>>  Repeat TFTs 11/14 showed TSH=0.11 but FreeT3=2.6 (2.3-4.2) and FreeT4=1.01 (0.60-1.60)... We will refer to Endocrine for consult...          Problem List:    ALLERGIC RHINITIS (ICD-477.9) - on ALLEGRA 180mg /d,  FLONASE 2spQhs...  COPD (ICD-496) &  ASTHMATIC BRONCHITIS, ACUTE (ICD-466.0) - on ADVAIR 250Bid,  SINGULAIR 10mg /d... states doing well, breathing well... denies cough, sputum, hemoptysis, worsening dyspnea,  wheezing, chest pains, snoring, daytime hypersomnolence, etc...  ~  baseline CXR w/ pleural thickening on right (prev surg), NAD... ~  PFT 10/08 showed FVC= 2.69 (108%), FEV1= 1.27 (70%), %1sec= 47, mid-flows= 17%pred. ~  CXR 11/09 & 11/10 showed COPD, NAD. ~  CXR 11/11 showed COPD w/ bullous dis, scar right base, NAD.Marland Kitchen. ~  CXR 11/12 showed min scarring RLL, chr changes, NAD.Marland Kitchen. ~  CXR 11/13 showed normal heart size, COPD/E, NAD... ~  11/14: presented w/ URI & AB exac> Rx w/ Depo80, Pred-3dtaper, Levaquin (Aligh, Activia), Mucinex, Hycodan... ~  CXR 11/14 showed norm heart size, clear lungs w/ min scarring at bases, no lesions seen, NAD...  Hx of ADENOCARCINOMA, LUNG (ICD-162.9) - former very heavy  smoker (up to 2ppd) and quit in 1997... routine CXR in 2000 showed a 1.5cm RLL nodule... eval revealed a non-small cell cancer-  s/p RLLobectomy 6/00 by DrBurney for poorly diff AdenoCa... Oncology eval by Duwayne Heck w/ no other therapy recommended... no known recurrence w/ yearly f/u since 2000. ~  Follow up CXRs as above...  HYPERCHOLESTEROLEMIA (ICD-272.0) - on LIPITOR 10mg /d... ~  FLP 10/08 on Lip10 showed TChol 168,TG 105, HDL 49, LDL 98... rec- same med + better diet! ~  FLP 11/09 on Lip10 showed TChol 143, TG 120, HDL 37, LDL 83 ~  FLP 11/10 on Lip10 showed TChol 156, TG 118, HDL 41, LDL 91 ~  FLP 11/11 on Lip10 showed TChol 150, TG 116, HDL 41, LDL 86 ~  FLP 11/12 on Lip10 showed TChol 137,  TG 78, HDL 50, LDL 71 ~  FLP 11/13 on Lip10 showed TChol 154, TG 117, HDL 47, LDL 84  ~  FLP 11/14 on Lip10 showed TChol 152, TG 76, HDL 41, LDL 96   LOW TSH >>  ~  Labs 11/14 showed TSH= 0.17 & she is clinically euthyroid;  Repeat studies showed TSH=0.11 but FreeT3=2.6 (2.3-4.2) and FreeT4=1.01 (0.60-1.60)... We will refer to Endocrine for consult...  DIVERTICULOSIS OF COLON (ICD-562.10) COLONIC POLYPS (ICD-211.3) - tubular adenoma removed in 1997... colonoscopy 6/07 by [facility] showing diminutive polyp and divertics... f/u planned 26yrs. ~  Repeat colonoscopy 8/12 by [facility] showed divertics, three 5mm polyps removed, f/u planned 5yrs...  Hx of CERVICAL CANCER (ICD-180.9) & MICROSCOPIC HEMATURIA (ICD-599.72) - s/p hysterectomy & unilateral oopherectomy for Ca in situ of cervix 1985 by DrMcPhail... she is now followed by DrKRichardson & had some microscopic hematuria noted> referred to Urology w/ eval DrTannenbaum (neg CT cysto, NMP22) and he continues yearlt ROV...  OSTEOPOROSIS (ICD-733.00) - prev on Actonel but she stopped this on her own & doesn't want to restart bisphos therapy... takes Calcium supplements & Vitamins... ~  BMD at [facility] 9/06 showed TScores -1.9 in Spine,  -1.8 in left THip,   -2.1 in left FemNeck. ~  labs 11/09 showed Vit D level = 32... rec to start OTC Vit D supplement ~1000u daily. ~  labs 11/10 showed Vit D level = 35... continue daily supplementation. ~  BMD 11/13 at [facility] showed TScore -2.4 in left [facility] & she declines Bisphos therapy, just wants to continue Calcium, MVI, VitD... ~  11/14: on Calcium, VitD2000; she refuses Bisphos therapy because "it made my stomach burn"; rec trial Binosto 70mg /wk (samples given).  ANXIETY (ICD-300.00) - takes ATIVAN 1mg  Qhs...  Health Maintenance: she takes ASA 81mg /d... ~  GI:  followed by [facility] w/ last colon 6/12 w/ several polyps removed; we don't have path reports but she was told f/u 5 yrs... ~  GYN:  followed by DrKRichardson for PAP etc (she sent her to Urology for hematuria)... goes to [facility] for Mammograms & BMDs... ~  Immunizations:  she had a PNEUMOVAX in 2003 & 11/13 at age66... gets yearly FLU shots... She had Shingles vax 1/13 at [facility]...   Past Surgical History  Procedure Laterality Date  . Appendectomy    . Total abdominal hysterectomy w/ bilateral salpingoophorectomy  1985    Dr Elana Alm for Ca of cervix    Outpatient Encounter Prescriptions as of 04/27/2013  Medication Sig  . Ascorbic Acid (VITAMIN C) 500 MG tablet Take 500 mg by mouth daily.    Marland Kitchen aspirin 81 MG tablet Take 81 mg by mouth daily.    Marland Kitchen atorvastatin (LIPITOR) 10 MG tablet Take 1 tablet (10 mg total) by mouth daily.  Marland Kitchen azithromycin (ZITHROMAX) 250 MG tablet Take 1 tablet (250 mg total) by mouth daily.  . calcium-vitamin D (OSCAL 500/200 D-3) 500-200 MG-UNIT per tablet Take 1 tablet by mouth daily.    . Cholecalciferol (VITAMIN D) 2000 UNITS CAPS Take 1 capsule by mouth daily.    . clotrimazole-betamethasone (LOTRISONE) cream Apply topically 2 (two) times daily.  . fexofenadine (ALLEGRA) 180 MG tablet Take 180 mg by mouth daily.    . fluticasone (FLONASE) 50 MCG/ACT nasal spray Place 2 sprays into the nose daily.  .  Fluticasone-Salmeterol (ADVAIR DISKUS) 250-50 MCG/DOSE AEPB Inhale 1 puff into the lungs every 12 (twelve) hours.  Marland Kitchen LORazepam (ATIVAN) 1 MG tablet Take 1 tablet (1 mg total)  by mouth at bedtime as needed.  . montelukast (SINGULAIR) 10 MG tablet Take 1 tablet (10 mg total) by mouth at bedtime.    Allergies  Allergen Reactions  . Sulfonamide Derivatives Hives and Rash    Current Medications, Allergies, Past Medical History, Past Surgical History, Family History, and Social History were reviewed in [employer] record.    Review of Systems         See HPI - all other systems neg except as noted... Presents 11/14 w/ cough, congestion, drainage, wheezing... The patient denies anorexia, fever, weight loss, weight gain, vision loss, decreased hearing, hoarseness, chest pain, syncope, dyspnea on exertion, peripheral edema, headaches, hemoptysis, abdominal pain, melena, hematochezia, severe indigestion/heartburn, hematuria, incontinence, muscle weakness, suspicious skin lesions, transient blindness, difficulty walking, depression, unusual weight change, abnormal bleeding, enlarged lymph nodes, and angioedema.     Objective:   Physical Exam    WD, petite, overweight, 67 y/o WF in NAD... GENERAL:  Alert & oriented; pleasant & cooperative... HEENT:  [place]/AT, EOM-wnl, PERRLA, EACs-clear, TMs-wnl, NOSE-clear drainage , THROAT-clear & wnl. NECK:  Supple w/ fairROM; no JVD; normal carotid impulses w/o bruits; no thyromegaly or nodules palpated; no lymphadenopathy. CHEST:  Decr BS bilat w/ exp wheezing, rhonchi, congestion, etc... HEART:  Regular Rhythm; without murmurs/ rubs/ or gallops detected... ABDOMEN:  Soft & nontender; normal bowel sounds; no organomegaly or masses palpated... EXT: without deformities, mild arthritic changes; no varicose veins/ venous insuffic/ or edema. NEURO:   no focal deficits noted.   RADIOLOGY DATA:  Reviewed in the EPIC EMR & discussed w/ the  patient...  LABORATORY DATA:  Reviewed in the EPIC EMR & discussed w/ the patient...   Assessment & Plan:    CPX>  Stable- CXR, & Fasting blood work all looks good...  COPD/ AB>  Ex-smoker, still uses the gum!  CXR is clear; presents 11/14 w/ AB exac & we will trat w/ depo/Pred, Levaquin, Mucinex, Hycodan...   Hx AdenoCa Lung> s/p RLLobectomy 2000>  No known recurrence & f/u CXR is ok...  CHOL>  Controlled on Lip10 + diet efforts...  Hx Cervical Cancer> s/p Hyst & unilat oopherectomy 1985>  Followed by GYN...  Osteoporosis> BMDs done at [facility] & Tscaore= -2.4; she has refused Bisphos rx due to "stomach burning" & rec to try Binosto70/wk (try samples)...  Anxiety>  She uses Ativan as needed...   Patient's Medications  New Prescriptions   ALENDRONATE SODIUM (BINOSTO) 70 MG TBEF    Take 1 tablet by mouth once a week.   HYDROCODONE-HOMATROPINE (HYCODAN) 5-1.5 MG/5ML SYRUP    Take 5 mLs by mouth every 6 (six) hours as needed for cough.   LEVOFLOXACIN (LEVAQUIN) 500 MG TABLET    Take 1 tablet (500 mg total) by mouth daily.   PREDNISONE (DELTASONE) 20 MG TABLET    1 po bid x 3 days, 1 daily x 3 days, 1/2 tab daily x 3 days, 1/2 tab every other day until gone  Previous Medications   ASCORBIC ACID (VITAMIN C) 500 MG TABLET    Take 500 mg by mouth daily.     ASPIRIN 81 MG TABLET    Take 81 mg by mouth daily.     CALCIUM-VITAMIN D (OSCAL 500/200 D-3) 500-200 MG-UNIT PER TABLET    Take 1 tablet by mouth daily.     CHOLECALCIFEROL (VITAMIN D) 2000 UNITS CAPS    Take 1 capsule by mouth daily.     CLOTRIMAZOLE-BETAMETHASONE (LOTRISONE) CREAM    Apply  topically 2 (two) times daily.   FEXOFENADINE (ALLEGRA) 180 MG TABLET    Take 180 mg by mouth daily.    Modified Medications   Modified Medication Previous Medication   ATORVASTATIN (LIPITOR) 10 MG TABLET atorvastatin (LIPITOR) 10 MG tablet      Take 1 tablet (10 mg total) by mouth daily.    Take 1 tablet (10 mg total) by mouth daily.    FLUTICASONE (FLONASE) 50 MCG/ACT NASAL SPRAY fluticasone (FLONASE) 50 MCG/ACT nasal spray      Place 2 sprays into both nostrils daily.    Place 2 sprays into the nose daily.   FLUTICASONE-SALMETEROL (ADVAIR DISKUS) 250-50 MCG/DOSE AEPB Fluticasone-Salmeterol (ADVAIR DISKUS) 250-50 MCG/DOSE AEPB      Inhale 1 puff into the lungs every 12 (twelve) hours.    Inhale 1 puff into the lungs every 12 (twelve) hours.   LORAZEPAM (ATIVAN) 1 MG TABLET LORazepam (ATIVAN) 1 MG tablet      Take 1 tablet (1 mg total) by mouth at bedtime as needed.    Take 1 tablet (1 mg total) by mouth at bedtime as needed.   MONTELUKAST (SINGULAIR) 10 MG TABLET montelukast (SINGULAIR) 10 MG tablet      Take 1 tablet (10 mg total) by mouth at bedtime.    Take 1 tablet (10 mg total) by mouth at bedtime.  Discontinued Medications   AZITHROMYCIN (ZITHROMAX) 250 MG TABLET    Take 1 tablet (250 mg total) by mouth daily.

## 2013-04-29 ENCOUNTER — Other Ambulatory Visit (INDEPENDENT_AMBULATORY_CARE_PROVIDER_SITE_OTHER): Payer: Medicare Other

## 2013-04-29 DIAGNOSIS — M810 Age-related osteoporosis without current pathological fracture: Secondary | ICD-10-CM

## 2013-04-29 DIAGNOSIS — D126 Benign neoplasm of colon, unspecified: Secondary | ICD-10-CM

## 2013-04-29 DIAGNOSIS — E7800 Pure hypercholesterolemia, unspecified: Secondary | ICD-10-CM

## 2013-04-29 DIAGNOSIS — Z85118 Personal history of other malignant neoplasm of bronchus and lung: Secondary | ICD-10-CM

## 2013-04-29 DIAGNOSIS — F411 Generalized anxiety disorder: Secondary | ICD-10-CM

## 2013-04-29 LAB — CBC WITH DIFFERENTIAL/PLATELET
Basophils Absolute: 0 10*3/uL (ref 0.0–0.1)
Basophils Relative: 0.1 % (ref 0.0–3.0)
Eosinophils Relative: 0 % (ref 0.0–5.0)
Hemoglobin: 13.5 g/dL (ref 12.0–15.0)
Lymphocytes Relative: 15.2 % (ref 12.0–46.0)
MCV: 90.7 fl (ref 78.0–100.0)
Monocytes Relative: 6.3 % (ref 3.0–12.0)
Neutro Abs: 9.1 10*3/uL — ABNORMAL HIGH (ref 1.4–7.7)
RBC: 4.42 Mil/uL (ref 3.87–5.11)
RDW: 12.6 % (ref 11.5–14.6)
WBC: 11.6 10*3/uL — ABNORMAL HIGH (ref 4.5–10.5)

## 2013-04-29 LAB — BASIC METABOLIC PANEL
BUN: 23 mg/dL (ref 6–23)
CO2: 25 mEq/L (ref 19–32)
Calcium: 9.6 mg/dL (ref 8.4–10.5)
Creatinine, Ser: 0.8 mg/dL (ref 0.4–1.2)
Glucose, Bld: 90 mg/dL (ref 70–99)
Potassium: 4.2 mEq/L (ref 3.5–5.1)

## 2013-04-29 LAB — LIPID PANEL
Cholesterol: 152 mg/dL (ref 0–200)
HDL: 41.2 mg/dL (ref >39.00)
LDL Cholesterol: 96 mg/dL (ref 0–99)
Total CHOL/HDL Ratio: 4
Triglycerides: 76 mg/dL (ref 0.0–149.0)
VLDL: 15.2 mg/dL (ref 0.0–40.0)

## 2013-04-29 LAB — HEPATIC FUNCTION PANEL
AST: 34 U/L (ref 0–37)
Albumin: 3.7 g/dL (ref 3.5–5.2)
Alkaline Phosphatase: 47 U/L (ref 39–117)
Total Protein: 7.5 g/dL (ref 6.0–8.3)

## 2013-04-30 ENCOUNTER — Encounter: Payer: Self-pay | Admitting: Pulmonary Disease

## 2013-04-30 ENCOUNTER — Other Ambulatory Visit: Payer: Self-pay | Admitting: Pulmonary Disease

## 2013-04-30 DIAGNOSIS — F411 Generalized anxiety disorder: Secondary | ICD-10-CM

## 2013-05-03 ENCOUNTER — Other Ambulatory Visit (INDEPENDENT_AMBULATORY_CARE_PROVIDER_SITE_OTHER): Payer: Medicare Other

## 2013-05-03 DIAGNOSIS — F411 Generalized anxiety disorder: Secondary | ICD-10-CM

## 2013-05-03 LAB — TSH: TSH: 0.11 u[IU]/mL — ABNORMAL LOW (ref 0.35–5.50)

## 2013-05-03 LAB — T4, FREE: Free T4: 1.01 ng/dL (ref 0.60–1.60)

## 2013-05-03 LAB — T3, FREE: T3, Free: 2.6 pg/mL (ref 2.3–4.2)

## 2013-05-04 ENCOUNTER — Telehealth: Payer: Self-pay | Admitting: Pulmonary Disease

## 2013-05-04 DIAGNOSIS — R7989 Other specified abnormal findings of blood chemistry: Secondary | ICD-10-CM

## 2013-05-04 NOTE — Telephone Encounter (Signed)
Pt informed of thyroid lab results per Dr Kriste Basque.    Order sent for Endocrine consult.

## 2013-05-07 ENCOUNTER — Ambulatory Visit: Payer: Medicare Other | Admitting: Internal Medicine

## 2013-05-18 ENCOUNTER — Ambulatory Visit (INDEPENDENT_AMBULATORY_CARE_PROVIDER_SITE_OTHER): Payer: Medicare Other | Admitting: Internal Medicine

## 2013-05-18 ENCOUNTER — Encounter: Payer: Self-pay | Admitting: Internal Medicine

## 2013-05-18 VITALS — BP 126/70 | HR 72 | Temp 98.3°F | Resp 10 | Ht <= 58 in | Wt 137.0 lb

## 2013-05-18 DIAGNOSIS — E0590 Thyrotoxicosis, unspecified without thyrotoxic crisis or storm: Secondary | ICD-10-CM

## 2013-05-18 NOTE — Progress Notes (Signed)
Patient ID: [name], female   DOB: Sep 15, 1945, 67 y.o.   MRN: [id number]   HPI  Pamela Terry is a 67 y.o.-year-old female, referred by her PCP, Dr. Kriste Basque, for evaluation for subclinical hyperthyroidism.  Pt was recently seen by Dr Kriste Basque (04/27/2013) for asthmatic bronchitis, this visit turned into an APE >> several tests were checked >> TSH was incidentally found low (confirmed afterwards).   She tells me she had cataract Sx in 01/2013 and 03/2013 >> was on Prednisone eye gtt for 1 mo after each surgery. She also had a Solumedrol injection on 04/27/2013, then started on Prednisone taper for 11 days (and an ABx). She is wondering if these steroid doses could have had anything to do with the low TSH, especially the Solumedrol inj. (first TSH checked 2 days after this and confirmed low 4 days after this, while still on Prednisone po).  I reviewed pt's thyroid tests: Lab Results  Component Value Date   TSH 0.11* 05/03/2013   TSH 0.17* 04/29/2013   TSH 0.49 05/18/2012   TSH 0.46 05/20/2011   TSH 0.31* 05/10/2010   TSH 0.33* 05/08/2009   TSH 0.38 04/27/2008   TSH 0.42 04/16/2007   TSH 0.42 04/09/2006   FREET4 1.01 05/03/2013    Pt denies feeling nodules in neck, hoarseness, dysphagia/odynophagia, SOB with lying down; she c/o: - no excessive sweating/heat intolerance - + tremors - if gets nervous - + anxiety - + palpitations - if gets nervous - + fatigue after her bronchitis - no hyperdefecation - + weight loss since she felt poorly with asthmatic bronchitis b/c low appetite>> now appetite back >> gained back 1 lb  Pt does not have a FH of thyroid ds. No FH of thyroid cancer. No h/o radiation tx to head or neck.  No seaweed or kelp, no recent contrast studies, no iodine supplements. No herbal supplements.   I reviewed her chart and she also has a history of cervical and lung CA (both stage 1), HL, anxiety, OP.  ROS: Constitutional: see HPI Eyes: no blurry vision, no  xerophthalmia ENT: no sore throat, no nodules palpated in throat, no dysphagia/odynophagia, no hoarseness Cardiovascular: no CP/SOB/palpitations/leg swelling Respiratory: + cough/no SOB/+ wheezing Gastrointestinal: no N/V/D/C Musculoskeletal: no muscle/joint aches Skin: no rashes Neurological: + tremors/no numbness/tingling/dizziness Psychiatric: no depression/+ anxiety  Past Medical History  Diagnosis Date  . Allergic rhinitis   . Acute asthmatic bronchitis   . COPD (chronic obstructive pulmonary disease)   . Hypercholesterolemia   . Diverticulosis of colon   . Colon polyp   . History of cervical cancer   . Microscopic hematuria   . Osteoporosis   . Anxiety   . Adenocarcinoma, lung   . Carcinoma in situ of endocrine gland    Past Surgical History  Procedure Laterality Date  . Appendectomy    . Total abdominal hysterectomy w/ bilateral salpingoophorectomy  1985    Dr Elana Alm for Ca of cervix  . Abdominal hysterectomy      in her 52's   History   Social History  . Marital Status: Married    Spouse Name: N/A    Number of Children: 1   Occupational History  . Scientific laboratory technician    Social History Main Topics  . Smoking status: Former Smoker -- 2.00 packs/day for 23 years    Types: Cigarettes    Quit date: 06/24/1985  . Smokeless tobacco: Not on file  . Alcohol Use: No  . Drug Use: No  .  Sexual Activity: No   Social History Narrative   Regular exercise: walk   Caffeine use: 3 sodas daily, sweet tea   Current Outpatient Prescriptions on File Prior to Visit  Medication Sig Dispense Refill  . Alendronate Sodium (BINOSTO) 70 MG TBEF Take 1 tablet by mouth once a week.  4 tablet  11  . Ascorbic Acid (VITAMIN C) 500 MG tablet Take 500 mg by mouth daily.        Marland Kitchen aspirin 81 MG tablet Take 81 mg by mouth daily.        Marland Kitchen atorvastatin (LIPITOR) 10 MG tablet Take 1 tablet (10 mg total) by mouth daily.  30 tablet  11  . calcium-vitamin D (OSCAL 500/200 D-3) 500-200  MG-UNIT per tablet Take 1 tablet by mouth daily.        . Cholecalciferol (VITAMIN D) 2000 UNITS CAPS Take 1 capsule by mouth daily.        . clotrimazole-betamethasone (LOTRISONE) cream Apply topically 2 (two) times daily.  45 g  5  . fexofenadine (ALLEGRA) 180 MG tablet Take 180 mg by mouth daily.        . fluticasone (FLONASE) 50 MCG/ACT nasal spray Place 2 sprays into both nostrils daily.  16 g  11  . Fluticasone-Salmeterol (ADVAIR DISKUS) 250-50 MCG/DOSE AEPB Inhale 1 puff into the lungs every 12 (twelve) hours.  1 each  11  . HYDROcodone-homatropine (HYCODAN) 5-1.5 MG/5ML syrup Take 5 mLs by mouth every 6 (six) hours as needed for cough.  180 mL  0  . levofloxacin (LEVAQUIN) 500 MG tablet Take 1 tablet (500 mg total) by mouth daily.  10 tablet  0  . LORazepam (ATIVAN) 1 MG tablet Take 1 tablet (1 mg total) by mouth at bedtime as needed.  90 tablet  5  . montelukast (SINGULAIR) 10 MG tablet Take 1 tablet (10 mg total) by mouth at bedtime.  30 tablet  11  . predniSONE (DELTASONE) 20 MG tablet 1 po bid x 3 days, 1 daily x 3 days, 1/2 tab daily x 3 days, 1/2 tab every other day until gone  15 tablet  0   No current facility-administered medications on file prior to visit.   Allergies  Allergen Reactions  . Sulfonamide Derivatives Hives and Rash   Family History  Problem Relation Age of Onset  . Asthma Maternal Aunt   . Heart disease Father   . Heart disease Paternal Grandfather   . Heart disease Maternal Grandmother   . Rheum arthritis Mother   . Breast cancer Paternal Grandmother   . Prostate cancer Maternal Grandfather   . Hyperlipidemia Father   . Diabetes Brother   . Stroke Father    PE: BP 126/70  Pulse 72  Temp(Src) 98.3 F (36.8 C) (Oral)  Resp 10  Ht 4\' 9"  (1.448 m)  Wt 137 lb (62.143 kg)  BMI 29.64 kg/m2  SpO2 95% Wt Readings from Last 3 Encounters:  05/18/13 137 lb (62.143 kg)  04/27/13 143 lb (64.864 kg)  05/18/12 141 lb 6.4 oz (64.139 kg)   Constitutional:  overweight, in NAD Eyes: PERRLA, EOMI, no exophthalmos, no lid lag, no stare ENT: moist mucous membranes, no thyromegaly, no thyroid bruits, no cervical lymphadenopathy Cardiovascular: RRR, No MRG Respiratory: CTA B Gastrointestinal: abdomen soft, NT, ND, BS+ Musculoskeletal: no deformities, strength intact in all 4 Skin: moist, warm, no rashes Neurological: + tremor with outstretched hands, DTR 3/4 in all 4  ASSESSMENT: 1. Subclinical hyperthyroidism  PLAN:  1. Patient with a recently found low TSH, with some possible thyrotoxic sxs: weight loss, palpitations, anxiety. She has had a mildly low TSH in 2010-2011. No thyroid nodules or neck compression sxs. - patient has a possible exogenous cause for the low TSH in her recent steroid intake: Solu-Medrol injection/prednisone by mouth/prednisone eye drops. The first set of tests was checked 2 days after her Solu-Medrol injection, while she was on prednisone higher dose, and this was repeated 4 days later, however she was still on prednisone at that time. - We discussed that possible endogenous causes of thyrotoxicosis are:  Graves ds   Thyroiditis toxic multinodular goiter/ toxic adenoma (I cannot feel nodules at palpation of his thyroid). - I suggested that we check the TSH, fT3 and fT4 in about a month from now - If the tests remain abnormal we might need an uptake and scan to differentiate between the 3 above possible etiologies  - we discussed about possible modalities of treatment for the above conditions, to include methimazole use, radioactive iodine ablation or surgery. - I do not feel that we need to add beta blockers at this time, since she is not tachycardic. She is anxious, however she has a history of anxiety and she was very nervous about our discussion today, she tells me - RTC in 3 months, but in 5 weeks for repeat labs  If labs are worse then, we'll do an uptake and scan  If labs are about the same, we might need to repeat  TFTs in another 4-6 weeks  If labs are normal, we might not need further investigation Patient agrees to the plan.

## 2013-05-18 NOTE — Patient Instructions (Signed)
You have Subclinical Hyperthyroidism (this is a mild degree of overactive thyroid disease). Please come in ~5 weeks for labs. Please come in 3 months for another appointment. Please send me a message through MyChart if you develop the symptoms below.  Hyperthyroidism The thyroid is a large gland located in the lower front part of your neck. The thyroid helps control metabolism. Metabolism is how your body uses food. It controls metabolism with the hormone thyroxine. When the thyroid is overactive, it produces too much hormone. When this happens, these following problems may occur:   Nervousness  Heat intolerance  Weight loss (in spite of increase food intake)  Diarrhea  Change in hair or skin texture  Palpitations (heart skipping or having extra beats)  Tachycardia (rapid heart rate)  Loss of menstruation (amenorrhea)  Shaking of the hands CAUSES  Grave's Disease (the immune system attacks the thyroid gland). This is the most common cause.  Inflammation of the thyroid gland.  Tumor (usually benign) in the thyroid gland or elsewhere.  Excessive use of thyroid medications (both prescription and 'natural').  Excessive ingestion of Iodine. DIAGNOSIS  To prove hyperthyroidism, your caregiver may do blood tests and ultrasound tests. Sometimes the signs are hidden. It may be necessary for your caregiver to watch this illness with blood tests, either before or after diagnosis and treatment. TREATMENT Short-term treatment There are several treatments to control symptoms. Drugs called beta blockers may give some relief. Drugs that decrease hormone production will provide temporary relief in many people. These measures will usually not give permanent relief. Definitive therapy There are treatments available which can be discussed between you and your caregiver which will permanently treat the problem. These treatments range from surgery (removal of the thyroid), to the use of  radioactive iodine (destroys the thyroid by radiation), to the use of antithyroid drugs (interfere with hormone synthesis). The first two treatments are permanent and usually successful. They most often require hormone replacement therapy for life. This is because it is impossible to remove or destroy the exact amount of thyroid required to make a person euthyroid (normal). HOME CARE INSTRUCTIONS  See your caregiver if the problems you are being treated for get worse. Examples of this would be the problems listed above. SEEK MEDICAL CARE IF: Your general condition worsens. MAKE SURE YOU:   Understand these instructions.  Will watch your condition.  Will get help right away if you are not doing well or get worse. Document Released: 06/10/2005 Document Revised: 09/02/2011 Document Reviewed: 10/22/2006 [facility] [place] Patient Information 2014 [place], [place].

## 2013-05-19 ENCOUNTER — Ambulatory Visit: Payer: Medicare Other | Admitting: Pulmonary Disease

## 2013-05-26 ENCOUNTER — Encounter: Payer: Self-pay | Admitting: *Deleted

## 2013-06-21 ENCOUNTER — Other Ambulatory Visit (INDEPENDENT_AMBULATORY_CARE_PROVIDER_SITE_OTHER): Payer: Medicare Other

## 2013-06-21 ENCOUNTER — Other Ambulatory Visit: Payer: Self-pay | Admitting: Internal Medicine

## 2013-06-21 DIAGNOSIS — E0590 Thyrotoxicosis, unspecified without thyrotoxic crisis or storm: Secondary | ICD-10-CM

## 2013-06-21 LAB — T4, FREE: Free T4: 0.75 ng/dL (ref 0.60–1.60)

## 2013-08-19 ENCOUNTER — Ambulatory Visit: Payer: Medicare Other | Admitting: Internal Medicine

## 2013-08-24 ENCOUNTER — Ambulatory Visit (INDEPENDENT_AMBULATORY_CARE_PROVIDER_SITE_OTHER): Payer: Medicare Other | Admitting: Internal Medicine

## 2013-08-24 ENCOUNTER — Encounter: Payer: Self-pay | Admitting: Internal Medicine

## 2013-08-24 VITALS — BP 138/76 | HR 75 | Temp 98.2°F | Resp 12 | Wt 140.0 lb

## 2013-08-24 DIAGNOSIS — E0590 Thyrotoxicosis, unspecified without thyrotoxic crisis or storm: Secondary | ICD-10-CM

## 2013-08-24 LAB — TSH: TSH: 0.24 u[IU]/mL — ABNORMAL LOW (ref 0.35–5.50)

## 2013-08-24 LAB — T4, FREE: FREE T4: 0.76 ng/dL (ref 0.60–1.60)

## 2013-08-24 LAB — T3, FREE: T3, Free: 2.9 pg/mL (ref 2.3–4.2)

## 2013-08-24 NOTE — Progress Notes (Signed)
Patient ID: Pamela Terry, female   DOB: 1945-11-20, 68 y.o.   MRN: [id number]   HPI  Pamela Terry is a 68 y.o.-year-old female, initially referred by her PCP, Dr. Lenna Gilford, for evaluation for subclinical hyperthyroidism. Last visit 3 mo ago.  Review hx: Pt was seen by Dr Lenna Gilford (04/27/2013) for asthmatic bronchitis, this visit turned into an APE >> several tests were checked >> TSH was incidentally found low (confirmed afterwards).   She had cataract Sx in 01/2013 and 03/2013 >> was on Prednisone eye gtt for 1 mo after each surgery. She also had a Solumedrol injection on 04/27/2013, then started on Prednisone taper for 11 days (and an ABx). She is wondering if these steroid doses could have had anything to do with the low TSH, especially the Solumedrol inj. (first TSH checked 2 days after this and confirmed low 4 days after this, while still on Prednisone po).  I reviewed pt's thyroid tests: Lab Results  Component Value Date   TSH 0.41 06/21/2013   TSH 0.11* 05/03/2013   TSH 0.17* 04/29/2013   TSH 0.49 05/18/2012   TSH 0.46 05/20/2011   TSH 0.31* 05/10/2010   TSH 0.33* 05/08/2009   TSH 0.38 04/27/2008   TSH 0.42 04/16/2007   TSH 0.42 04/09/2006   FREET4 0.75 06/21/2013   FREET4 1.01 05/03/2013    Pt denies feeling nodules in neck, hoarseness, dysphagia/odynophagia, SOB with lying down; she c/o: - no excessive sweating/heat intolerance - no more tremors - she had them if gets nervous - no anxiety - no palpitations - unless gets nervous - no fatigue - resolved after her bronchitis - no hyperdefecation - her weight loss resolved  - she had hair loss, but resolved  I reviewed her chart and she also has a history of cervical and lung CA (both stage 1), HL, anxiety, OP.  I reviewed pt's medications, allergies, PMH, social hx, family hx and no changes required, except as mentioned above.  ROS: Constitutional: see HPI Eyes: no blurry vision, no xerophthalmia ENT: no sore throat, no  nodules palpated in throat, no dysphagia/odynophagia, no hoarseness Cardiovascular: no CP/SOB/palpitations/leg swelling Respiratory: no cough/no SOB/wheezing Gastrointestinal: no N/V/D/C Musculoskeletal: no muscle/joint aches Skin: no rashes Neurological: + mild tremors/no numbness/tingling/dizziness  PE: BP 138/76  Pulse 75  Temp(Src) 98.2 F (36.8 C) (Oral)  Resp 12  Wt 140 lb (63.504 kg)  SpO2 95% Wt Readings from Last 3 Encounters:  08/24/13 140 lb (63.504 kg)  05/18/13 137 lb (62.143 kg)  04/27/13 143 lb (64.864 kg)  Body mass index is 30.29 kg/(m^2).  Constitutional: overweight, in NAD Eyes: PERRLA, EOMI, no exophthalmos, no lid lag, no stare ENT: moist mucous membranes, no thyromegaly, no cervical lymphadenopathy Cardiovascular: RRR, No MRG Respiratory: CTA B Gastrointestinal: abdomen soft, NT, ND, BS+ Musculoskeletal: no deformities, strength intact in all 4 Skin: moist, warm, no rashes Neurological:  Mild + tremor with outstretched hands and head, DTR 3/4 in all 4  ASSESSMENT: 1. Subclinical hyperthyroidism - resolved  PLAN:  1. Patient with a recent low TSH x 2, with some possible thyrotoxic sxs: weight loss, palpitations, anxiety. She has had a mildly low TSH in 2010-2011. She has a possible exogenous cause for the low TSH: Solu-Medrol injection/prednisone by mouth/prednisone eye drops. The first set of tests was checked 2 days after her Solu-Medrol injection, while she was on prednisone higher dose, and this was repeated 4 days later, however she was still on prednisone at that time. - I repeated  her TFTs at last visit and they were normal >> schedule this appt in 3 mo since prior - will check TSH, fT3 and fT4 today - RTC if labs are abnormal, OTW follow up with Dr Lenna Gilford and come back as needed.  If labs are worse then, we'll check an uptake and scan  If labs are about the same, we might need to repeat TFTs in another 4-6 weeks  If labs are normal, we might  not need further investigation Patient agrees to the plan.  Office Visit on 08/24/2013  Component Date Value Ref Range Status  . TSH 08/24/2013 0.24* 0.35 - 5.50 uIU/mL Final  . Free T4 08/24/2013 0.76  0.60 - 1.60 ng/dL Final  . T3, Free 08/24/2013 2.9  2.3 - 4.2 pg/mL Final   TSH again lower than normal. Will continue to see pt >> next labs to be repeated in 2 months. I will ask her to schedule a new appt in 4 months.

## 2013-08-24 NOTE — Patient Instructions (Signed)
Please return if labs are abnormal - I will let you know. If labs are normal, you can continue to follow with Dr Lenna Gilford and return to me as needed.

## 2013-10-26 ENCOUNTER — Other Ambulatory Visit (INDEPENDENT_AMBULATORY_CARE_PROVIDER_SITE_OTHER): Payer: Medicare Other

## 2013-10-26 DIAGNOSIS — E0590 Thyrotoxicosis, unspecified without thyrotoxic crisis or storm: Secondary | ICD-10-CM

## 2013-10-26 LAB — T4, FREE: FREE T4: 0.71 ng/dL (ref 0.60–1.60)

## 2013-10-26 LAB — TSH: TSH: 0.31 u[IU]/mL — ABNORMAL LOW (ref 0.35–4.50)

## 2013-10-26 LAB — T3, FREE: T3, Free: 3 pg/mL (ref 2.3–4.2)

## 2013-12-23 ENCOUNTER — Ambulatory Visit (INDEPENDENT_AMBULATORY_CARE_PROVIDER_SITE_OTHER): Payer: Medicare Other | Admitting: Internal Medicine

## 2013-12-23 VITALS — BP 122/68 | HR 69 | Temp 98.3°F | Resp 12 | Wt 141.0 lb

## 2013-12-23 DIAGNOSIS — E0590 Thyrotoxicosis, unspecified without thyrotoxic crisis or storm: Secondary | ICD-10-CM

## 2013-12-23 LAB — T4, FREE: Free T4: 0.78 ng/dL (ref 0.60–1.60)

## 2013-12-23 LAB — TSH: TSH: 0.09 u[IU]/mL — ABNORMAL LOW (ref 0.35–4.50)

## 2013-12-23 LAB — T3, FREE: T3, Free: 2.8 pg/mL (ref 2.3–4.2)

## 2013-12-23 NOTE — Patient Instructions (Signed)
Please stop at the lab.  Please come back for a follow-up appointment in 6 months.  

## 2013-12-23 NOTE — Progress Notes (Signed)
Patient ID: Pamela Terry, female   DOB: [birth year], 68 y.o.   MRN: [id number]   HPI  Pamela Terry is a 68 y.o.-year-old female, returning for f/u for subclinical hyperthyroidism. Last visit 4 mo ago.  Review hx: Pt was seen by Dr Lenna Gilford (04/27/2013) for asthmatic bronchitis, this visit turned into an APE >> several tests were checked >> TSH was incidentally found low (confirmed afterwards).   I reviewed pt's latest thyroid tests: Appointment on 10/26/2013  Component Date Value Ref Range Status  . T3, Free 10/26/2013 3.0  2.3 - 4.2 pg/mL Final  . Free T4 10/26/2013 0.71  0.60 - 1.60 ng/dL Final  . TSH 10/26/2013 0.31* 0.35 - 4.50 uIU/mL Final   Previously: Lab Results  Component Value Date   TSH 0.31* 10/26/2013   TSH 0.24* 08/24/2013   TSH 0.41 06/21/2013   TSH 0.11* 05/03/2013   TSH 0.17* 04/29/2013   TSH 0.49 05/18/2012   TSH 0.46 05/20/2011   TSH 0.31* 05/10/2010   TSH 0.33* 05/08/2009   TSH 0.38 04/27/2008   FREET4 0.71 10/26/2013   FREET4 0.76 08/24/2013   FREET4 0.75 06/21/2013   FREET4 1.01 05/03/2013    Pt denies feeling nodules in neck, hoarseness, dysphagia/odynophagia, SOB with lying down; also: - no excessive sweating/heat intolerance - some tremors - if gets nervous - no anxiety - no palpitations - no fatigue  - no hyperdefecation - her weight loss resolved  - no hair loss  I reviewed her chart and she also has a history of cervical and lung CA (both stage 1), HL, anxiety, OP.  I reviewed pt's medications, allergies, PMH, social hx, family hx and no changes required, except as mentioned above.  ROS: Constitutional: see HPI Eyes: no blurry vision, no xerophthalmia ENT: no sore throat, no nodules palpated in throat, no dysphagia/odynophagia, no hoarseness Cardiovascular: no CP/SOB/palpitations/leg swelling Respiratory: no cough/no SOB/wheezing Gastrointestinal: no N/V/D/C Musculoskeletal: no muscle/joint aches Skin: no rashes Neurological: no tremors/no  numbness/tingling/dizziness  PE: BP 122/68  Pulse 69  Temp(Src) 98.3 F (36.8 C) (Oral)  Resp 12  Wt 141 lb (63.957 kg)  SpO2 97% Body mass index is 30.5 kg/(m^2).  Wt Readings from Last 3 Encounters:  12/23/13 141 lb (63.957 kg)  08/24/13 140 lb (63.504 kg)  05/18/13 137 lb (62.143 kg)  Body mass index is 30.5 kg/(m^2).  Constitutional: overweight, in NAD Eyes: PERRLA, EOMI, no exophthalmos, no lid lag, no stare ENT: moist mucous membranes, no thyromegaly, no cervical lymphadenopathy Cardiovascular: RRR, No MRG Respiratory: CTA B Gastrointestinal: abdomen soft, NT, ND, BS+ Musculoskeletal: no deformities, strength intact in all 4 Skin: moist, warm, no rashes Neurological: no tremor with outstretched hands and head, DTR 3/4 in all 4  ASSESSMENT: 1. Subclinical hyperthyroidism  PLAN:  1. Patient with a several instances of a low TSH, with some possible thyrotoxic sxs previously: weight loss, palpitations, anxiety; which have now resolved. Her pulse is normal and she is not losing weight. - I repeated her TFTs 2 mo ago >> improved  - will check TSH, fT3 and fT4 today - if labs are abnormal, will have her back earlier for a repeat, OTW, I will see her back in 6 mo. She agrees to plan.  Office Visit on 12/23/2013  Component Date Value Ref Range Status  . Free T4 12/23/2013 0.78  0.60 - 1.60 ng/dL Final  . T3, Free 12/23/2013 2.8  2.3 - 4.2 pg/mL Final  . TSH 12/23/2013 0.09* 0.35 - 4.50 uIU/mL  Final   TSH lower. Will recheck in 5-6 weeks. If still low, will check an uptake and scan.

## 2013-12-27 ENCOUNTER — Encounter: Payer: Self-pay | Admitting: Internal Medicine

## 2014-01-03 ENCOUNTER — Telehealth: Payer: Self-pay | Admitting: Pulmonary Disease

## 2014-01-03 MED ORDER — AMOXICILLIN-POT CLAVULANATE 875-125 MG PO TABS: 1.0000 | ORAL_TABLET | Freq: Two times a day (BID) | ORAL | Status: DC

## 2014-01-03 MED ORDER — METHYLPREDNISOLONE 4 MG PO TABS: ORAL_TABLET | ORAL | Status: DC

## 2014-01-03 NOTE — Telephone Encounter (Signed)
Per SN call in augmentin [phone] #[id number] 1 po BID Medrol dose pak take as directed OTC align QD while on ABX  I called and made pt aware of recs. rx called in. Nothing further needed

## 2014-01-03 NOTE — Telephone Encounter (Signed)
Called spoke with pt. She c/o ? Sinus infection. C/o prod cough-geen-yellow-brown phlem, achy, PND, nasal cong, facial pressure, raw throat x Friday. Pt has been using her flonase, doing saline nasal rinses, mucinex dm, singulair. Please advise SN thanks  Allergies  Allergen Reactions  . Sulfonamide Derivatives Hives and Rash     Current Outpatient Prescriptions on File Prior to Visit  Medication Sig Dispense Refill  . Alendronate Sodium (BINOSTO) 70 MG TBEF Take 1 tablet by mouth once a week.  4 tablet  11  . Ascorbic Acid (VITAMIN C) 500 MG tablet Take 500 mg by mouth daily.        Marland Kitchen aspirin 81 MG tablet Take 81 mg by mouth daily.        Marland Kitchen atorvastatin (LIPITOR) 10 MG tablet Take 1 tablet (10 mg total) by mouth daily.  30 tablet  11  . calcium-vitamin D (OSCAL 500/200 D-3) 500-200 MG-UNIT per tablet Take 1 tablet by mouth daily.        . Cholecalciferol (VITAMIN D) 2000 UNITS CAPS Take 1 capsule by mouth daily.        . fexofenadine (ALLEGRA) 180 MG tablet Take 180 mg by mouth daily.        . fluticasone (FLONASE) 50 MCG/ACT nasal spray Place 2 sprays into both nostrils daily.  16 g  11  . Fluticasone-Salmeterol (ADVAIR DISKUS) 250-50 MCG/DOSE AEPB Inhale 1 puff into the lungs every 12 (twelve) hours.  1 each  11  . HYDROcodone-homatropine (HYCODAN) 5-1.5 MG/5ML syrup Take 5 mLs by mouth every 6 (six) hours as needed for cough.  180 mL  0  . levofloxacin (LEVAQUIN) 500 MG tablet Take 1 tablet (500 mg total) by mouth daily.  10 tablet  0  . LORazepam (ATIVAN) 1 MG tablet Take 1 tablet (1 mg total) by mouth at bedtime as needed.  90 tablet  5  . montelukast (SINGULAIR) 10 MG tablet Take 1 tablet (10 mg total) by mouth at bedtime.  30 tablet  11  . predniSONE (DELTASONE) 20 MG tablet 1 po bid x 3 days, 1 daily x 3 days, 1/2 tab daily x 3 days, 1/2 tab every other day until gone  15 tablet  0   No current facility-administered medications on file prior to visit.

## 2014-02-01 ENCOUNTER — Other Ambulatory Visit (INDEPENDENT_AMBULATORY_CARE_PROVIDER_SITE_OTHER): Payer: Medicare Other

## 2014-02-01 DIAGNOSIS — E0590 Thyrotoxicosis, unspecified without thyrotoxic crisis or storm: Secondary | ICD-10-CM

## 2014-02-01 LAB — T4, FREE: Free T4: 0.78 ng/dL (ref 0.60–1.60)

## 2014-02-01 LAB — T3, FREE: T3, Free: 2.9 pg/mL (ref 2.3–4.2)

## 2014-02-01 LAB — TSH: TSH: 0.34 u[IU]/mL — AB (ref 0.35–4.50)

## 2014-03-21 ENCOUNTER — Telehealth: Payer: Self-pay | Admitting: Pulmonary Disease

## 2014-03-21 NOTE — Telephone Encounter (Signed)
Called and spoke with pt and she is aware of change in appt from 11/4 at 9:30 to 2:30.

## 2014-04-13 ENCOUNTER — Encounter: Payer: Self-pay | Admitting: Pulmonary Disease

## 2014-04-27 ENCOUNTER — Ambulatory Visit (INDEPENDENT_AMBULATORY_CARE_PROVIDER_SITE_OTHER)
Admission: RE | Admit: 2014-04-27 | Discharge: 2014-04-27 | Disposition: A | Discharge status: Home / Self Care | Payer: Medicare Other | Source: Ambulatory Visit | Attending: Pulmonary Disease | Admitting: Pulmonary Disease

## 2014-04-27 ENCOUNTER — Ambulatory Visit: Payer: Medicare Other | Admitting: Pulmonary Disease

## 2014-04-27 ENCOUNTER — Encounter: Payer: Self-pay | Admitting: Pulmonary Disease

## 2014-04-27 ENCOUNTER — Ambulatory Visit (INDEPENDENT_AMBULATORY_CARE_PROVIDER_SITE_OTHER): Payer: Medicare Other | Admitting: Pulmonary Disease

## 2014-04-27 VITALS — BP 140/72 | HR 68 | Temp 98.3°F | Ht <= 58 in | Wt 144.0 lb

## 2014-04-27 DIAGNOSIS — R3129 Other microscopic hematuria: Secondary | ICD-10-CM

## 2014-04-27 DIAGNOSIS — F411 Generalized anxiety disorder: Secondary | ICD-10-CM

## 2014-04-27 DIAGNOSIS — D126 Benign neoplasm of colon, unspecified: Secondary | ICD-10-CM

## 2014-04-27 DIAGNOSIS — E0590 Thyrotoxicosis, unspecified without thyrotoxic crisis or storm: Secondary | ICD-10-CM

## 2014-04-27 DIAGNOSIS — J449 Chronic obstructive pulmonary disease, unspecified: Secondary | ICD-10-CM

## 2014-04-27 DIAGNOSIS — M810 Age-related osteoporosis without current pathological fracture: Secondary | ICD-10-CM

## 2014-04-27 DIAGNOSIS — R312 Other microscopic hematuria: Secondary | ICD-10-CM

## 2014-04-27 DIAGNOSIS — Z85118 Personal history of other malignant neoplasm of bronchus and lung: Secondary | ICD-10-CM

## 2014-04-27 DIAGNOSIS — C539 Malignant neoplasm of cervix uteri, unspecified: Secondary | ICD-10-CM

## 2014-04-27 DIAGNOSIS — E7800 Pure hypercholesterolemia, unspecified: Secondary | ICD-10-CM

## 2014-04-27 DIAGNOSIS — K5730 Diverticulosis of large intestine without perforation or abscess without bleeding: Secondary | ICD-10-CM

## 2014-04-27 MED ORDER — FLUTICASONE-SALMETEROL 250-50 MCG/DOSE IN AEPB: 1.0000 | INHALATION_SPRAY | Freq: Two times a day (BID) | RESPIRATORY_TRACT | Status: DC

## 2014-04-27 MED ORDER — MONTELUKAST SODIUM 10 MG PO TABS: 10.0000 mg | ORAL_TABLET | Freq: Every day | ORAL | Status: DC

## 2014-04-27 MED ORDER — FLUTICASONE PROPIONATE 50 MCG/ACT NA SUSP: 2.0000 | Freq: Every day | NASAL | Status: DC

## 2014-04-27 MED ORDER — LORAZEPAM 1 MG PO TABS: 1.0000 mg | ORAL_TABLET | Freq: Every evening | ORAL | Status: DC | PRN

## 2014-04-27 MED ORDER — ATORVASTATIN CALCIUM 10 MG PO TABS: 10.0000 mg | ORAL_TABLET | Freq: Every day | ORAL | Status: DC

## 2014-04-27 MED ORDER — ALENDRONATE SODIUM 70 MG PO TBEF: 1.0000 | EFFERVESCENT_TABLET | ORAL | Status: DC

## 2014-04-27 NOTE — Progress Notes (Signed)
Subjective:    Patient ID: Pamela Terry, female    DOB: Feb 11, 1946, 68 y.o.   MRN: [id number]  HPI 68 y/o WF here for a follow up visit and CPX... she has multiple medical problems including COPD, hx adenocarcinoma RLL w/ lobectomy 2000 & no known recurrence, Hyperlipidemia, hx ca cervix w/ hyst 1985, osteopenia, and anxiety...  ~  May 20, 2011:  Yearly ROV & CPX> she has a new Great Dane puppy= Roxie (62mo old now); she had Shingles 5/12 on left buttock area, saw TP & Rx w/ Valtrex/ Pred; mild intertrig rash Rx w/ Lotrisone cream prn;  Breathing is stable on Advair, Singular, and CXR remains stable;  Chol looks good on Lip10 & wt down 7# even though she is not following diet, she says;  OK TDAP today & Rx for Shingles vaccine;  See prob list below>>    She saw [facility] 8/12 for f/u colonoscopy> +Divertics and three 21mm polyps removed w/ f/u colon planned in 22yrs...    She saw DrTannenbaum 10/12 for f/u microscoptic hematuria, stress incont, & adrenal adenoma> stable, symptoms mild, no further work up needed, f/u 23yr...  ~  May 18, 2012:  Yearly [place]uyler reports that she is doing well- no new complaints or concerns; We reviewed the following medical problems during today's office visit >>     AR> on Allegra180, Flonase; she denies allergy symptoms at this time...    COPD, AB> on Advair250, Singulair10; she denies cough, sput, hemoptysis, SOB, etc...    Hx of Lung Cancer> ex-smoker quit 1997; RLL nodule found 2000 & she had RLLobectomy by DrBurney for poorly diff AdenoCa; oncology eval by Maud Deed & no other therapy rec; no known recurrence to date...    Chol> on ASA81, Atorva10; FLP shows TChol 154, TG 117, HDL 47, LDL 84    Divertics, Polyps> last colonoscopy 8/12 by [facility] showed divertics & several 45mm polyps, we don't have path, f/u due 5 yrs...    Hx Cancer of the Cervix> remote hx of Ca Cx w/ hyst & 1 ovary in 1985 by DrMcPhail; now followed by DrKRichardson...    Hx  microhematuria> she had f/u DrTannenbaum 10/12- right adrenal adenoma, stress incont, microheme, hx Ca Cx; stable, no changes, f/u 36yr...    Osteopenia> on Calcium, VitD2000; she gets BMDs at [facility]- last 11/13 w/ TScore -2.4 in left [facility]; she refuses Bisphos therapy...    Anxiety> on Ativan 1mg  prn and this works well for her... We reviewed prob list, meds, xrays and labs> see below for updates >> she had the 2013 Flu shot 10/13 & due for the PNEUMOVAX now- age 83; also received the Shingles vaccine 1/13 at [facility]...  CXR 11/13 showed normal heart size, COPD/E, NAD...  LABS 11/13:  FLP- at goals on Lip10;  Chems- wnl;  CBC- wnl;  TSH=0.49;  VitD=44   ~  April 27, 2013:  Yearly ROV & [name] has acute symptoms of cough, wheezing/ chest congestion, yellow sput, sinus draining/ pressure, & low grade fever; she feels crummy but denies HA or SOB; Exam reveals prominent wheezing & rhonchi bilat;  We discussed Rx w/ Depo80, Pred-3dtaper, Levaquin (Aligh, Activia), Mucinex, Hycodan...  We reviewed the following medical problems during today's office visit >>     AR> on Allegra180, Flonase; she denies active allergy symptoms at this time...    COPD, AB> on Advair250, Singulair10 regularly; presents 11/14 w/ URI & acute exac as above=> rx w/ Depo/Pred, Levaquin, Mucinex,  Hycodan...    Hx of Lung Cancer> ex-smoker quit 1997; RLL nodule found 2000 & she had RLLobectomy by DrBurney for poorly diff AdenoCa; oncology eval by Maud Deed & no other therapy rec; CXR 11/14 looks good & no known recurrence to date...    Chol> on ASA81, Atorva10; FLP shows TChol 152, TG 76, HDL 41, LDL 96 therefore continue same...    Divertics, Polyps> last colonoscopy 8/12 by [facility] showed divertics & several 98mm polyps, we don't have path, he recommended f/u in 5 yrs...    Hx Cancer of the Cervix> remote hx of Ca Cx w/ hyst & 1 ovary in 1985 by DrMcPhail; now followed by DrKRichardson but she reports overdue & asked to sched  f/u...    Hx microhematuria> she had f/u DrTannenbaum 10/12- right adrenal adenoma, stress incont, microheme, hx Ca Cx; stable, no changes; Koreen says "he released me"...    Osteopenia> on Calcium, VitD2000; she gets BMDs at [facility]- last 11/13 w/ TScore -2.4 in left [facility]; she refuses Bisphos therapy because "it made my stomach burn"; rec trial Binosto 70mg /wk (samples given).    Anxiety> on Ativan 1mg  prn and this works well for her... We reviewed prob list, meds, xrays and labs> see below for updates >> she had the 2014 Flu shot 10/14...  CXR 11/14 showed norm heart size, clear lungs w/ min scarring at bases, no lesions seen, NAD...  LABS 11/14:  FLP- at goals on atorva10;  Chems- wnl;  CBC- wnl;  TSH=0/17, not on meds, r/o hyperthy => repeat w/ FreeT3 & FreeT4...  ADDENDUM>>  Repeat TFTs 11/14 showed TSH=0.11 but FreeT3=2.6 (2.3-4.2) and FreeT4=1.01 (0.60-1.60)... We will refer to Endocrine for consult...  ~  April 27, 2014:  Yearly [place] reports "everything is good"; ROS notes some vague intermittent fleeting & self-limited SSCP, she is not concerned & declines any cardiac eval at this time; We reviewed the following medical problems during today's office visit >>     AR> on Allegra180, Flonase; she denies active allergy symptoms at this time...    COPD, AB> on Advair250, Singulair10 regularly; seen 11/14 w/ URI & acute exac as above=> rx w/ Depo/Pred, Levaquin, Mucinex, Hycodan & resolved; no prob over the last yr & not using the Advair regularly (ave 1-2 per week)...    Hx of Lung Cancer> ex-smoker quit 1997; RLL nodule found 2000 & she had RLLobectomy by DrBurney for poorly diff AdenoCa; oncology eval by Maud Deed & no other therapy rec; CXR 11/15 looks good & no known recurrence to date...    Chol> on ASA81, Atorva10; FLP 11/15 shows TChol 202, TG 118, HDL 43, LDL 135... Not as good- contin same med, better diet or will need to incr dose.    Thyroid> she has subclinical  hyperthyroidism w/ suppressed TSH & normal FreeT3 & FreeT4; eval by DrGherghe- stable labs and she follows up every 66mo...    Divertics, Polyps> last colonoscopy 8/12 by [facility] showed divertics & several 76mm polyps, we don't have path, he recommended f/u in 5 yrs...    Hx Cancer of the Cervix> remote hx of Ca Cx w/ hyst & 1ovary removed in 1985 by DrMcPhail; now followed by DrKRichardson but she reports overdue & asked to sched f/u...    Hx microhematuria> she had f/u DrTannenbaum 1/14- right adrenal adenoma, stress incont, microheme, hx Ca Cx; stable & he notes neg CT, cysto, NMP22 in 2/11; no changes & Lourdes says "he released me"...    Osteopenia>  on Calcium, VitD2000; she gets BMDs at [facility]- last 11/13 w/ TScore -2.4 in left [facility]; she refused Bisphos therapy because "it made my stomach burn"; she tried Binosto 70mg  in 2014 and tol well but she never filled Rx- prescription re-written & encouraged to take it every week...    Anxiety> on Ativan 1mg  prn and this works well for her... We reviewed prob list, meds, xrays and labs> see below for updates >> she had the 2015 Flu vaccine 10/15...  CXR 11/15 showed norm heart size, calcif in Ao, clear lungs/ NAD, DJD in spine...   EKG 11/15 showed NSR, rate68, min NSSTTWA, NAD...   LABS 11/15:  FLP- not at goals w/ TChol=202 LDL=135;  Chems- wnl;  CBC- wnl;  TSH=0.90;  UA- no change +bl, no rbc, no infection...          Problem List:    ALLERGIC RHINITIS (ICD-477.9) - on ALLEGRA 180mg /d,  FLONASE 2spQhs...  COPD (ICD-496) &  ASTHMATIC BRONCHITIS, ACUTE (ICD-466.0) - on ADVAIR 250Bid,  SINGULAIR 10mg /d... states doing well, breathing well... denies cough, sputum, hemoptysis, worsening dyspnea,  wheezing, chest pains, snoring, daytime hypersomnolence, etc...  ~  baseline CXR w/ pleural thickening on right (prev surg), NAD... ~  PFT 10/08 showed FVC= 2.69 (108%), FEV1= 1.27 (70%), %1sec= 47, mid-flows= 17%pred. ~  CXR 11/09 & 11/10 showed COPD,  NAD. ~  CXR 11/11 showed COPD w/ bullous dis, scar right base, NAD.Marland Kitchen. ~  CXR 11/12 showed min scarring RLL, chr changes, NAD.Marland Kitchen. ~  CXR 11/13 showed normal heart size, COPD/E, NAD... ~  11/14: presented w/ URI & AB exac> Rx w/ Depo80, Pred-3dtaper, Levaquin (Aligh, Activia), Mucinex, Hycodan... ~  CXR 11/14 showed norm heart size, clear lungs w/ min scarring at bases, no lesions seen, NAD... ~  11/15: she reports doing well, no resp exac, only using the Advair once or twice per week... ~  CXR 11/15 showed norm heart size, calcif in Ao, clear lungs/ NAD, DJD in spine...  Hx of ADENOCARCINOMA, LUNG (ICD-162.9) - former very heavy smoker (up to 2ppd) and quit in 1997... routine CXR in 2000 showed a 1.5cm RLL nodule... eval revealed a non-small cell cancer-  s/p RLLobectomy 6/00 by DrBurney for poorly diff AdenoCa... Oncology eval by Maud Deed w/ no other therapy recommended... no known recurrence w/ yearly f/u since 2000. ~  Follow up CXRs as above...  HYPERCHOLESTEROLEMIA (ICD-272.0) - on LIPITOR 10mg /d... ~  [place] 10/08 on Lip10 showed TChol 168,TG 105, HDL 49, LDL 98... rec- same med + better diet! ~  FLP 11/09 on Lip10 showed TChol 143, TG 120, HDL 37, LDL 83 ~  FLP 11/10 on Lip10 showed TChol 156, TG 118, HDL 41, LDL 91 ~  FLP 11/11 on Lip10 showed TChol 150, TG 116, HDL 41, LDL 86 ~  FLP 11/12 on Lip10 showed TChol 137, TG 78, HDL 50, LDL 71 ~  FLP 11/13 on Lip10 showed TChol 154, TG 117, HDL 47, LDL 84  ~  FLP 11/14 on Lip10 showed TChol 152, TG 76, HDL 41, LDL 96  ~  FLP 11/15 on Lip10 showed TChol 202, TG 118, HDL 43, LDL 135... Needs better diet or incr dose!  LOW TSH >>  ~  Labs 11/14 showed TSH= 0.17 & she is clinically euthyroid;  Repeat studies showed TSH=0.11 but FreeT3=2.6 (2.3-4.2) and FreeT4=1.01 (0.60-1.60)... We will refer to Endocrine for consult... ~  Now followed by DrGherghe for Endocrine/ Thyroid...  DIVERTICULOSIS OF COLON (ICD-562.10) COLONIC POLYPS (  ICD-211.3) -  tubular adenoma removed in 1997... colonoscopy 6/07 by [facility] showing diminutive polyp and divertics... f/u planned 32yrs. ~  Repeat colonoscopy 8/12 by [facility] showed divertics, three 43mm polyps removed, f/u planned 65yrs...  Hx of CERVICAL CANCER (ICD-180.9) & MICROSCOPIC HEMATURIA (ICD-599.72) - s/p hysterectomy & unilateral oopherectomy for Ca in situ of cervix 1985 by DrMcPhail... she is now followed by DrKRichardson & had some microscopic hematuria noted> referred to Urology w/ eval DrTannenbaum (neg CT cysto, NMP22) and he continues yearlt ROV...  OSTEOPOROSIS (ICD-733.00) - prev on Actonel but she stopped this on her own & doesn't want to restart bisphos therapy... takes Calcium supplements & Vitamins... ~  BMD at [facility] 9/06 showed TScores -1.9 in Spine,  -1.8 in left THip,  -2.1 in left FemNeck. ~  labs 11/09 showed Vit D level = 32... rec to start OTC Vit D supplement ~1000u daily. ~  labs 11/10 showed Vit D level = 35... continue daily supplementation. ~  BMD 11/13 at [facility] showed TScore -2.4 in left [facility] & she declines Bisphos therapy, just wants to continue Calcium, MVI, VitD... ~  11/14: on Calcium, VitD2000; she refuses Bisphos therapy because "it made my stomach burn"; rec trial Binosto 70mg /wk (samples given). ~  11/15: she reports that she tolerated the Binosto well but never filled the Rx; encouraged to take it every week along w/ her calcium, MVI, VitD...  ANXIETY (ICD-300.00) - takes ATIVAN 1mg  Qhs...  Health Maintenance: she takes ASA 81mg /d... ~  GI:  followed by [facility] w/ last colon 6/12 w/ several polyps removed; we don't have path reports but she was told f/u 5 yrs... ~  GYN:  followed by DrKRichardson for PAP etc (she sent her to Urology for hematuria)... goes to [facility] for Mammograms & BMDs... ~  Immunizations:  she had a PNEUMOVAX in 2003 & 11/13 at age66... gets yearly FLU shots... She had Shingles vax 1/13 at [facility]...   Past Surgical History   Procedure Laterality Date  . Appendectomy    . Total abdominal hysterectomy w/ bilateral salpingoophorectomy  1985    Dr Ree Edman for Ca of cervix  . Abdominal hysterectomy      in her 24's    Outpatient Encounter Prescriptions as of 04/27/2014  Medication Sig  . Alendronate Sodium (BINOSTO) 70 MG TBEF Take 1 tablet by mouth once a week.  Marland Kitchen amoxicillin-clavulanate (AUGMENTIN) 875-125 MG per tablet Take 1 tablet by mouth 2 (two) times daily.  . Ascorbic Acid (VITAMIN C) 500 MG tablet Take 500 mg by mouth daily.    Marland Kitchen aspirin 81 MG tablet Take 81 mg by mouth daily.    Marland Kitchen atorvastatin (LIPITOR) 10 MG tablet Take 1 tablet (10 mg total) by mouth daily.  . calcium-vitamin D (OSCAL 500/200 D-3) 500-200 MG-UNIT per tablet Take 1 tablet by mouth daily.    . Cholecalciferol (VITAMIN D) 2000 UNITS CAPS Take 1 capsule by mouth daily.    . fexofenadine (ALLEGRA) 180 MG tablet Take 180 mg by mouth daily.    . fluticasone (FLONASE) 50 MCG/ACT nasal spray Place 2 sprays into both nostrils daily.  . Fluticasone-Salmeterol (ADVAIR DISKUS) 250-50 MCG/DOSE AEPB Inhale 1 puff into the lungs every 12 (twelve) hours.  Marland Kitchen HYDROcodone-homatropine (HYCODAN) 5-1.5 MG/5ML syrup Take 5 mLs by mouth every 6 (six) hours as needed for cough.  Marland Kitchen levofloxacin (LEVAQUIN) 500 MG tablet Take 1 tablet (500 mg total) by mouth daily.  Marland Kitchen LORazepam (ATIVAN) 1 MG tablet Take 1 tablet (1  mg total) by mouth at bedtime as needed.  . methylPREDNISolone (MEDROL) 4 MG tablet Take as directed  . montelukast (SINGULAIR) 10 MG tablet Take 1 tablet (10 mg total) by mouth at bedtime.  . predniSONE (DELTASONE) 20 MG tablet 1 po bid x 3 days, 1 daily x 3 days, 1/2 tab daily x 3 days, 1/2 tab every other day until gone    Allergies  Allergen Reactions  . Sulfonamide Derivatives Hives and Rash    Current Medications, Allergies, Past Medical History, Past Surgical History, Family History, and Social History were reviewed in [employer] record.    Review of Systems         See HPI - all other systems neg except as noted... Presents 11/14 w/ cough, congestion, drainage, wheezing... The patient denies anorexia, fever, weight loss, weight gain, vision loss, decreased hearing, hoarseness, chest pain, syncope, dyspnea on exertion, peripheral edema, headaches, hemoptysis, abdominal pain, melena, hematochezia, severe indigestion/heartburn, hematuria, incontinence, muscle weakness, suspicious skin lesions, transient blindness, difficulty walking, depression, unusual weight change, abnormal bleeding, enlarged lymph nodes, and angioedema.     Objective:   Physical Exam    WD, petite, overweight, 68 y/o WF in NAD... GENERAL:  Alert & oriented; pleasant & cooperative... HEENT:  [place]/AT, EOM-wnl, PERRLA, EACs-clear, TMs-wnl, NOSE-clear drainage , THROAT-clear & wnl. NECK:  Supple w/ fairROM; no JVD; normal carotid impulses w/o bruits; no thyromegaly or nodules palpated; no lymphadenopathy. CHEST:  Decr BS bilat w/ exp wheezing, rhonchi, congestion, etc... HEART:  Regular Rhythm; without murmurs/ rubs/ or gallops detected... ABDOMEN:  Soft & nontender; normal bowel sounds; no organomegaly or masses palpated... EXT: without deformities, mild arthritic changes; no varicose veins/ venous insuffic/ or edema. NEURO:   no focal deficits noted.   RADIOLOGY DATA:  Reviewed in the EPIC EMR & discussed w/ the patient...  LABORATORY DATA:  Reviewed in the EPIC EMR & discussed w/ the patient...   Assessment & Plan:    CPX>  Stable- CXR, & Fasting blood work all looks good...  COPD/ AB>  Ex-smoker, still uses the gum!  CXR is clear; presents 11/14 w/ AB exac & we will trat w/ depo/Pred, Levaquin, Mucinex, Hycodan...   Hx AdenoCa Lung> s/p RLLobectomy 2000>  No known recurrence & f/u CXR is ok...  CHOL>  on Lip10 + diet efforts; needs better diet or incr dose w/ LDL at 135...  Hx Cervical Cancer> s/p Hyst & unilat  oopherectomy 1985>  Followed by GYN...  Osteoporosis> BMDs done at [facility] & Tscaore= -2.4; she has refused Bisphos rx due to "stomach burning" & rec to try Binosto70/wk (try samples)...  Anxiety>  She uses Ativan as needed...   Patient's Medications  New Prescriptions   No medications on file  Previous Medications   ASCORBIC ACID (VITAMIN C) 500 MG TABLET    Take 500 mg by mouth daily.     ASPIRIN 81 MG TABLET    Take 81 mg by mouth daily.     CALCIUM-VITAMIN D (OSCAL 500/200 D-3) 500-200 MG-UNIT PER TABLET    Take 1 tablet by mouth daily.     CHOLECALCIFEROL (VITAMIN D) 2000 UNITS CAPS    Take 1 capsule by mouth daily.     FEXOFENADINE (ALLEGRA) 180 MG TABLET    Take 180 mg by mouth daily.    Modified Medications   Modified Medication Previous Medication   ALENDRONATE SODIUM (BINOSTO) 70 MG TBEF Alendronate Sodium (BINOSTO) 70 MG TBEF  Take 1 tablet by mouth once a week.    Take 1 tablet by mouth once a week.   ATORVASTATIN (LIPITOR) 10 MG TABLET atorvastatin (LIPITOR) 10 MG tablet      Take 1 tablet (10 mg total) by mouth daily.    Take 1 tablet (10 mg total) by mouth daily.   FLUTICASONE (FLONASE) 50 MCG/ACT NASAL SPRAY fluticasone (FLONASE) 50 MCG/ACT nasal spray      Place 2 sprays into both nostrils daily.    Place 2 sprays into both nostrils daily.   FLUTICASONE-SALMETEROL (ADVAIR DISKUS) 250-50 MCG/DOSE AEPB Fluticasone-Salmeterol (ADVAIR DISKUS) 250-50 MCG/DOSE AEPB      Inhale 1 puff into the lungs every 12 (twelve) hours.    Inhale 1 puff into the lungs every 12 (twelve) hours.   LORAZEPAM (ATIVAN) 1 MG TABLET LORazepam (ATIVAN) 1 MG tablet      Take 1 tablet (1 mg total) by mouth at bedtime as needed.    Take 1 tablet (1 mg total) by mouth at bedtime as needed.   MONTELUKAST (SINGULAIR) 10 MG TABLET montelukast (SINGULAIR) 10 MG tablet      Take 1 tablet (10 mg total) by mouth at bedtime.    Take 1 tablet (10 mg total) by mouth at bedtime.  Discontinued Medications    AMOXICILLIN-CLAVULANATE (AUGMENTIN) 875-125 MG PER TABLET    Take 1 tablet by mouth 2 (two) times daily.   HYDROCODONE-HOMATROPINE (HYCODAN) 5-1.5 MG/5ML SYRUP    Take 5 mLs by mouth every 6 (six) hours as needed for cough.   LEVOFLOXACIN (LEVAQUIN) 500 MG TABLET    Take 1 tablet (500 mg total) by mouth daily.   METHYLPREDNISOLONE (MEDROL) 4 MG TABLET    Take as directed   PREDNISONE (DELTASONE) 20 MG TABLET    1 po bid x 3 days, 1 daily x 3 days, 1/2 tab daily x 3 days, 1/2 tab every other day until gone

## 2014-04-27 NOTE — Patient Instructions (Signed)
Today we updated your med list in our EPIC system...    Continue your current medications the same...    We refilled your meds per request...  Today we did your follow up CXR & EKG... Please return to our lab in the AM for your FASTING blood work...    We will contact you w/ the results when available...   Stay as active as possible...  Call for any questions...  Let's plan a follow up visit in 36yr, sooner if needed for aqny problems.Marland KitchenMarland Kitchen

## 2014-04-28 ENCOUNTER — Other Ambulatory Visit (INDEPENDENT_AMBULATORY_CARE_PROVIDER_SITE_OTHER): Payer: Medicare Other

## 2014-04-28 DIAGNOSIS — E0590 Thyrotoxicosis, unspecified without thyrotoxic crisis or storm: Secondary | ICD-10-CM

## 2014-04-28 DIAGNOSIS — R312 Other microscopic hematuria: Secondary | ICD-10-CM

## 2014-04-28 DIAGNOSIS — E7800 Pure hypercholesterolemia, unspecified: Secondary | ICD-10-CM

## 2014-04-28 DIAGNOSIS — R3129 Other microscopic hematuria: Secondary | ICD-10-CM

## 2014-04-28 DIAGNOSIS — K5730 Diverticulosis of large intestine without perforation or abscess without bleeding: Secondary | ICD-10-CM

## 2014-04-28 LAB — BASIC METABOLIC PANEL
BUN: 14 mg/dL (ref 6–23)
CALCIUM: 9.8 mg/dL (ref 8.4–10.5)
CO2: 31 mEq/L (ref 19–32)
CREATININE: 0.9 mg/dL (ref 0.4–1.2)
Chloride: 105 mEq/L (ref 96–112)
GFR: 64.67 mL/min (ref >60.00)
Glucose, Bld: 83 mg/dL (ref 70–99)
Potassium: 4.6 mEq/L (ref 3.5–5.1)
SODIUM: 142 meq/L (ref 135–145)

## 2014-04-28 LAB — CBC WITH DIFFERENTIAL/PLATELET
BASOS ABS: 0.1 10*3/uL (ref 0.0–0.1)
BASOS PCT: 0.8 % (ref 0.0–3.0)
Eosinophils Absolute: 0.2 10*3/uL (ref 0.0–0.7)
Eosinophils Relative: 2.8 % (ref 0.0–5.0)
HCT: 46.3 % — ABNORMAL HIGH (ref 36.0–46.0)
HEMOGLOBIN: 15 g/dL (ref 12.0–15.0)
LYMPHS PCT: 34.4 % (ref 12.0–46.0)
Lymphs Abs: 3.1 10*3/uL (ref 0.7–4.0)
MCHC: 32.5 g/dL (ref 30.0–36.0)
MCV: 91.7 fl (ref 78.0–100.0)
MONOS PCT: 7.2 % (ref 3.0–12.0)
Monocytes Absolute: 0.6 10*3/uL (ref 0.1–1.0)
NEUTROS ABS: 4.9 10*3/uL (ref 1.4–7.7)
Neutrophils Relative %: 54.8 % (ref 43.0–77.0)
Platelets: 304 10*3/uL (ref 150.0–400.0)
RBC: 5.05 Mil/uL (ref 3.87–5.11)
RDW: 13.1 % (ref 11.5–15.5)
WBC: 8.9 10*3/uL (ref 4.0–10.5)

## 2014-04-28 LAB — LIPID PANEL
Cholesterol: 202 mg/dL — ABNORMAL HIGH (ref 0–200)
HDL: 43 mg/dL (ref >39.00)
LDL CALC: 135 mg/dL — AB (ref 0–99)
NONHDL: 159
Total CHOL/HDL Ratio: 5
Triglycerides: 118 mg/dL (ref 0.0–149.0)
VLDL: 23.6 mg/dL (ref 0.0–40.0)

## 2014-04-28 LAB — URINALYSIS, ROUTINE W REFLEX MICROSCOPIC
BILIRUBIN URINE: NEGATIVE
Ketones, ur: NEGATIVE
LEUKOCYTES UA: NEGATIVE
NITRITE: NEGATIVE
Specific Gravity, Urine: 1.02 (ref 1.000–1.030)
Total Protein, Urine: NEGATIVE
Urine Glucose: NEGATIVE
Urobilinogen, UA: 0.2 (ref 0.0–1.0)
pH: 6 (ref 5.0–8.0)

## 2014-04-28 LAB — HEPATIC FUNCTION PANEL
ALBUMIN: 3.6 g/dL (ref 3.5–5.2)
ALK PHOS: 60 U/L (ref 39–117)
ALT: 26 U/L (ref 0–35)
AST: 26 U/L (ref 0–37)
BILIRUBIN DIRECT: 0.1 mg/dL (ref 0.0–0.3)
Total Bilirubin: 0.7 mg/dL (ref 0.2–1.2)
Total Protein: 7.6 g/dL (ref 6.0–8.3)

## 2014-04-28 LAB — TSH: TSH: 0.9 u[IU]/mL (ref 0.35–4.50)

## 2014-04-29 ENCOUNTER — Telehealth: Payer: Self-pay | Admitting: Pulmonary Disease

## 2014-04-29 NOTE — Telephone Encounter (Signed)
i have called and lmomtcb x 1 for the pt to discuss her results.

## 2014-05-02 NOTE — Telephone Encounter (Signed)
Pt returning call can be reached @ [phone].Pamela Terry

## 2014-05-03 NOTE — Telephone Encounter (Signed)
Pt calling for results, can be reached @ wk # [phone]

## 2014-05-04 NOTE — Progress Notes (Signed)
Quick Note:  Called and spoke to pt. Informed pt of the results (lab and CXR) per SN. Pt verbalized understanding and denied any further questions or concerns at this time. ______

## 2014-05-04 NOTE — Telephone Encounter (Signed)
Called and spoke to pt. Informed pt of the results (lab and CXR) per SN. Pt verbalized understanding and denied any further questions or concerns at this time.

## 2014-05-05 ENCOUNTER — Other Ambulatory Visit: Payer: Self-pay | Admitting: Pulmonary Disease

## 2014-06-27 ENCOUNTER — Ambulatory Visit: Payer: Medicare Other | Admitting: Internal Medicine

## 2014-07-01 ENCOUNTER — Telehealth: Payer: Self-pay | Admitting: Pulmonary Disease

## 2014-07-01 MED ORDER — AZITHROMYCIN 250 MG PO TABS: ORAL_TABLET | ORAL | Status: AC

## 2014-07-01 MED ORDER — PREDNISONE (PAK) 5 MG PO TABS
ORAL_TABLET | ORAL | Status: DC
Start: 2014-07-01 — End: 2014-07-21

## 2014-07-01 NOTE — Telephone Encounter (Signed)
lmtcb for pt.  

## 2014-07-01 NOTE — Telephone Encounter (Signed)
Spoke with pt. States that she feels she is getting a sinus infection. Reports sinus congestion, sneezing and dry cough. Denies SOB or chest tightness at this time. Has not tried any OTC medications. Would like something called in.  Allergies  Allergen Reactions  . Augmentin [Amoxicillin-Pot Clavulanate]     Severe diarrhea  . Sulfonamide Derivatives Hives and Rash   SN - please advise. Thanks.

## 2014-07-01 NOTE — Telephone Encounter (Signed)
Per SN---  zpak #1  Take as directed Prednisone dosepak  5 mg  6 day pack

## 2014-07-01 NOTE — Telephone Encounter (Signed)
Pt is aware of SN's recommendations. Rx's has been sent in.

## 2014-07-01 NOTE — Telephone Encounter (Signed)
Pt retuning call.Pamela Terry

## 2014-07-21 ENCOUNTER — Encounter: Payer: Self-pay | Admitting: Internal Medicine

## 2014-07-21 ENCOUNTER — Ambulatory Visit (INDEPENDENT_AMBULATORY_CARE_PROVIDER_SITE_OTHER): Payer: Medicare Other | Admitting: Internal Medicine

## 2014-07-21 VITALS — BP 128/68 | HR 78 | Temp 98.6°F | Resp 12 | Wt 144.0 lb

## 2014-07-21 DIAGNOSIS — E0590 Thyrotoxicosis, unspecified without thyrotoxic crisis or storm: Secondary | ICD-10-CM

## 2014-07-21 NOTE — Patient Instructions (Signed)
Please stop at the lab.  Please come back for a follow-up appointment in 6 months.  

## 2014-07-21 NOTE — Progress Notes (Signed)
Patient ID: Pamela Terry, female   DOB: 1946-05-28, 69 y.o.   MRN: [id number]   HPI  Pamela Terry is a 69 y.o.-year-old female, returning for f/u for subclinical hyperthyroidism. Last visit 6 mo ago.  Review hx: Pt was seen by Dr Lenna Gilford (04/27/2013) for asthmatic bronchitis, this visit turned into an APE >> several tests were checked >> TSH was incidentally found low (confirmed afterwards).   I reviewed pt's latest thyroid tests: Previously: Lab Results  Component Value Date   TSH 0.90 04/28/2014   TSH 0.34* 02/01/2014   TSH 0.09* 12/23/2013   TSH 0.31* 10/26/2013   TSH 0.24* 08/24/2013   TSH 0.41 06/21/2013   TSH 0.11* 05/03/2013   TSH 0.17* 04/29/2013   TSH 0.49 05/18/2012   TSH 0.46 05/20/2011   FREET4 0.78 02/01/2014   FREET4 0.78 12/23/2013   FREET4 0.71 10/26/2013   FREET4 0.76 08/24/2013   FREET4 0.75 06/21/2013   FREET4 1.01 05/03/2013    She had asthmatic bronchitis and was on Prednisone - stopped 1 week ago.   Pt denies feeling nodules in neck, hoarseness, dysphagia/odynophagia, SOB with lying down; also: - no excessive sweating/heat intolerance - some tremors - if gets nervous - no anxiety - no palpitations - no fatigue  - no hyperdefecation - her weight loss resolved  - no hair loss  I reviewed her chart and she also has a history of cervical and lung CA (both stage 1), HL, anxiety, OP.  I reviewed pt's medications, allergies, PMH, social hx, family hx and no changes required, except as mentioned above.  ROS: Constitutional: see HPI Eyes: no blurry vision, no xerophthalmia ENT: no sore throat, no nodules palpated in throat, no dysphagia/odynophagia, no hoarseness Cardiovascular: no CP/SOB/palpitations/leg swelling Respiratory: no cough/no SOB/wheezing Gastrointestinal: no N/V/D/C Musculoskeletal: no muscle/joint aches Skin: no rashes Neurological: no tremors/no numbness/tingling/dizziness  PE: BP 128/68 mmHg  Pulse 78  Temp(Src) 98.6 F (37 C)  (Oral)  Resp 12  Wt 144 lb (65.318 kg)  SpO2 96% Body mass index is 30.1 kg/(m^2).   Wt Readings from Last 3 Encounters:  07/21/14 144 lb (65.318 kg)  04/27/14 144 lb (65.318 kg)  12/23/13 141 lb (63.957 kg)  Body mass index is 30.1 kg/(m^2).  Constitutional: overweight, in NAD Eyes: PERRLA, EOMI, no exophthalmos, no lid lag, no stare ENT: moist mucous membranes, no thyromegaly, no cervical lymphadenopathy Cardiovascular: RRR, No MRG Respiratory: CTA B Gastrointestinal: abdomen soft, NT, ND, BS+ Musculoskeletal: no deformities, strength intact in all 4 Skin: moist, warm, no rashes Neurological: no tremor with outstretched hands and head, DTR 3/4 in all 4  ASSESSMENT: 1. Subclinical hyperthyroidism  PLAN:  1. Patient with a several instances of a low TSH, with some possible thyrotoxic sxs previously: weight loss, palpitations, anxiety; which have now resolved. She tells me she is not feeling anything different when the TSH is low. Her pulse is normal and she is not losing weight now. Her TSH has normalized at last check in 04/2014. - will check TSH, fT3 and fT4 today - we discussed about the fact that if the TSH is trending down again, I would suggest an Uptake and scan (explained how the test is done and possible results) and check for a "hot" thyroid nodule. She agrees to plan. - if labs are abnormal, will have her back earlier for a repeat, OTW, I will see her back in 6 mo.   Office Visit on 07/21/2014  Component Date Value Ref Range Status  .  TSH 07/21/2014 0.45  0.35 - 4.50 uIU/mL Final  . Free T4 07/21/2014 0.73  0.60 - 1.60 ng/dL Final  . T3, Free 07/21/2014 3.1  2.3 - 4.2 pg/mL Final   TFTs normal.

## 2014-07-25 LAB — T4, FREE: Free T4: 0.73 ng/dL (ref 0.60–1.60)

## 2014-07-25 LAB — T3, FREE: T3 FREE: 3.1 pg/mL (ref 2.3–4.2)

## 2014-07-25 LAB — TSH: TSH: 0.45 u[IU]/mL (ref 0.35–4.50)

## 2014-10-24 ENCOUNTER — Telehealth: Payer: Self-pay | Admitting: Pulmonary Disease

## 2014-10-24 MED ORDER — METHYLPREDNISOLONE 4 MG PO TBPK: ORAL_TABLET | ORAL | Status: DC

## 2014-10-24 MED ORDER — LEVOFLOXACIN 500 MG PO TABS: 500.0000 mg | ORAL_TABLET | Freq: Every day | ORAL | Status: DC

## 2014-10-24 NOTE — Telephone Encounter (Signed)
Called spoke with pt. She c/o runny nose, prod cough (yellow-green phlem), PND, nasal cong x yesterday. No f/c/s/n/v. She has not taken anything OTC yet. Please advise SN thanks  Allergies  Allergen Reactions  . Augmentin [Amoxicillin-Pot Clavulanate]     Severe diarrhea  . Sulfonamide Derivatives Hives and Rash

## 2014-10-24 NOTE — Telephone Encounter (Signed)
Rx's have been sent in per SN. Pt is aware. Nothing further was needed.

## 2014-10-24 NOTE — Telephone Encounter (Signed)
Per SN, please call in  Levaquin '500mg'$  #7 1 PO QD until gone Medrol dose pak #1 Take as directed  Please also inform pt to take OTC Align per package instructions while taking antibiotic and to call back if not feeling better.

## 2014-11-06 DIAGNOSIS — J019 Acute sinusitis, unspecified: Secondary | ICD-10-CM | POA: Diagnosis not present

## 2015-01-05 ENCOUNTER — Encounter: Payer: Self-pay | Admitting: Internal Medicine

## 2015-01-05 ENCOUNTER — Ambulatory Visit (INDEPENDENT_AMBULATORY_CARE_PROVIDER_SITE_OTHER): Payer: Medicare Other | Admitting: Internal Medicine

## 2015-01-05 VITALS — BP 126/80 | HR 69 | Temp 98.4°F | Resp 12 | Wt 136.8 lb

## 2015-01-05 DIAGNOSIS — E0590 Thyrotoxicosis, unspecified without thyrotoxic crisis or storm: Secondary | ICD-10-CM

## 2015-01-05 LAB — TSH: TSH: 0.71 u[IU]/mL (ref 0.35–4.50)

## 2015-01-05 LAB — T4, FREE: FREE T4: 0.71 ng/dL (ref 0.60–1.60)

## 2015-01-05 LAB — T3, FREE: T3, Free: 3.5 pg/mL (ref 2.3–4.2)

## 2015-01-05 NOTE — Patient Instructions (Signed)
Please stop at the lab.  Please come back for a follow-up appointment in 6 months.  

## 2015-01-05 NOTE — Progress Notes (Signed)
Patient ID: Pamela Terry, female   DOB: [birth year], 69 y.o.   MRN: [id number]   HPI  ZULEICA SEITH is a 69 y.o.-year-old female, returning for f/u for subclinical hyperthyroidism. Last visit 6 mo ago.  Sh lost 8 lbs in last 6 months (reduced portions).  She has been on Prednisone + ABx - started for URI in 10/2014 >> did not clear up >> Urgent care >> restarted Prednisone and prolonged the ABx course >> now cleared up.  Review hx: Pt was seen by Dr Lenna Gilford (04/27/2013) for asthmatic bronchitis, this visit turned into an APE >> several tests were checked >> TSH was incidentally found low (confirmed afterwards).   I reviewed pt's latest thyroid tests: Previously: Lab Results  Component Value Date   TSH 0.45 07/21/2014   TSH 0.90 04/28/2014   TSH 0.34* 02/01/2014   TSH 0.09* 12/23/2013   TSH 0.31* 10/26/2013   TSH 0.24* 08/24/2013   TSH 0.41 06/21/2013   TSH 0.11* 05/03/2013   TSH 0.17* 04/29/2013   TSH 0.49 05/18/2012   FREET4 0.73 07/21/2014   FREET4 0.78 02/01/2014   FREET4 0.78 12/23/2013   FREET4 0.71 10/26/2013   FREET4 0.76 08/24/2013   FREET4 0.75 06/21/2013   FREET4 1.01 05/03/2013    Pt denies feeling nodules in neck, hoarseness, dysphagia/odynophagia, SOB with lying down; also: - + weight loss - 8 lbs - no excessive sweating/heat intolerance - some tremors - if gets nervous - no anxiety - no palpitations - no fatigue  - no hyperdefecation - + hair loss - in June of every year (!)  She also has a history of cervical and lung CA (both stage 1), HL, anxiety, OP.  I reviewed pt's medications, allergies, PMH, social hx, family hx, and changes were documented in the history of present illness. Otherwise, unchanged from my initial visit note.  ROS: Constitutional: see HPI Eyes: no blurry vision, no xerophthalmia ENT: no sore throat, no nodules palpated in throat, no dysphagia/odynophagia, no hoarseness Cardiovascular: no CP/SOB/palpitations/leg  swelling Respiratory: + cough/+ SOB/+ wheezing >> all resolved Gastrointestinal: no N/V/D/C Musculoskeletal: no muscle/joint aches Skin: no rashes Neurological: no tremors/no numbness/tingling/dizziness  PE: BP 126/80 mmHg  Pulse 69  Temp(Src) 98.4 F (36.9 C) (Oral)  Resp 12  Wt 136 lb 12.8 oz (62.052 kg)  SpO2 95% Body mass index is 28.6 kg/(m^2).   Wt Readings from Last 3 Encounters:  01/05/15 136 lb 12.8 oz (62.052 kg)  07/21/14 144 lb (65.318 kg)  04/27/14 144 lb (65.318 kg)  Body mass index is 28.6 kg/(m^2).  Constitutional: overweight, in NAD Eyes: PERRLA, EOMI, no exophthalmos, no lid lag, no stare ENT: moist mucous membranes, no thyromegaly, no cervical lymphadenopathy Cardiovascular: RRR, No MRG Respiratory: CTA B Gastrointestinal: abdomen soft, NT, ND, BS+ Musculoskeletal: no deformities, strength intact in all 4 Skin: moist, warm, no rashes Neurological: no tremor with outstretched hands and head, DTR 2/4 in all 4  ASSESSMENT: 1. Subclinical hyperthyroidism  PLAN:  1. Patient with a several instances of a low TSH, with some possible thyrotoxic sxs previously: weight loss, palpitations, anxiety; which have now resolved, except she had weight loss since last visit >> she tells me this happens every summer as she is not hungry. Her pulse is normal and she does not have tremors, palpitations or tachycardia. Her TSH has normalized at last 2 checks. - will check TSH, fT3 and fT4 today - we discussed about the fact that if the TSH is trending down again,  I would suggest an Uptake and scan (explained how the test is done and possible results) and check for a "hot" thyroid nodule. She agrees to plan. - if labs are abnormal, will have her back earlier for a repeat, OTW, I will see her back in 6 mo.   Office Visit on 01/05/2015  Component Date Value Ref Range Status  . TSH 01/05/2015 0.71  0.35 - 4.50 uIU/mL Final  . Free T4 01/05/2015 0.71  0.60 - 1.60 ng/dL Final  .  T3, Free 01/05/2015 3.5  2.3 - 4.2 pg/mL Final   Normal TFTs.

## 2015-02-23 DIAGNOSIS — M722 Plantar fascial fibromatosis: Secondary | ICD-10-CM | POA: Diagnosis not present

## 2015-03-06 DIAGNOSIS — M722 Plantar fascial fibromatosis: Secondary | ICD-10-CM | POA: Diagnosis not present

## 2015-03-16 DIAGNOSIS — M722 Plantar fascial fibromatosis: Secondary | ICD-10-CM | POA: Diagnosis not present

## 2015-03-21 DIAGNOSIS — Z1231 Encounter for screening mammogram for malignant neoplasm of breast: Secondary | ICD-10-CM | POA: Diagnosis not present

## 2015-04-12 ENCOUNTER — Encounter: Payer: Self-pay | Admitting: Pulmonary Disease

## 2015-04-12 DIAGNOSIS — Z8262 Family history of osteoporosis: Secondary | ICD-10-CM | POA: Diagnosis not present

## 2015-04-12 DIAGNOSIS — Z810 Family history of intellectual disabilities: Secondary | ICD-10-CM | POA: Diagnosis not present

## 2015-04-12 DIAGNOSIS — M85852 Other specified disorders of bone density and structure, left thigh: Secondary | ICD-10-CM | POA: Diagnosis not present

## 2015-04-25 ENCOUNTER — Encounter: Payer: Self-pay | Admitting: Pulmonary Disease

## 2015-04-26 ENCOUNTER — Ambulatory Visit (INDEPENDENT_AMBULATORY_CARE_PROVIDER_SITE_OTHER): Payer: Medicare Other | Admitting: Pulmonary Disease

## 2015-04-26 ENCOUNTER — Ambulatory Visit (INDEPENDENT_AMBULATORY_CARE_PROVIDER_SITE_OTHER)
Admission: RE | Admit: 2015-04-26 | Discharge: 2015-04-26 | Disposition: A | Discharge status: Home / Self Care | Payer: Medicare Other | Source: Ambulatory Visit | Attending: Pulmonary Disease | Admitting: Pulmonary Disease

## 2015-04-26 ENCOUNTER — Encounter: Payer: Self-pay | Admitting: Pulmonary Disease

## 2015-04-26 ENCOUNTER — Other Ambulatory Visit (INDEPENDENT_AMBULATORY_CARE_PROVIDER_SITE_OTHER): Payer: Medicare Other

## 2015-04-26 VITALS — BP 138/70 | HR 74 | Temp 98.1°F | Ht <= 58 in | Wt 139.0 lb

## 2015-04-26 DIAGNOSIS — C539 Malignant neoplasm of cervix uteri, unspecified: Secondary | ICD-10-CM

## 2015-04-26 DIAGNOSIS — E7800 Pure hypercholesterolemia, unspecified: Secondary | ICD-10-CM

## 2015-04-26 DIAGNOSIS — J449 Chronic obstructive pulmonary disease, unspecified: Secondary | ICD-10-CM | POA: Diagnosis not present

## 2015-04-26 DIAGNOSIS — Z85118 Personal history of other malignant neoplasm of bronchus and lung: Secondary | ICD-10-CM

## 2015-04-26 DIAGNOSIS — K5730 Diverticulosis of large intestine without perforation or abscess without bleeding: Secondary | ICD-10-CM

## 2015-04-26 DIAGNOSIS — E0590 Thyrotoxicosis, unspecified without thyrotoxic crisis or storm: Secondary | ICD-10-CM | POA: Diagnosis not present

## 2015-04-26 DIAGNOSIS — M810 Age-related osteoporosis without current pathological fracture: Secondary | ICD-10-CM

## 2015-04-26 DIAGNOSIS — D126 Benign neoplasm of colon, unspecified: Secondary | ICD-10-CM

## 2015-04-26 DIAGNOSIS — J4489 Other specified chronic obstructive pulmonary disease: Secondary | ICD-10-CM

## 2015-04-26 LAB — HEPATIC FUNCTION PANEL
ALBUMIN: 4 g/dL (ref 3.5–5.2)
ALK PHOS: 57 U/L (ref 39–117)
ALT: 23 U/L (ref 0–35)
AST: 20 U/L (ref 0–37)
Bilirubin, Direct: 0.1 mg/dL (ref 0.0–0.3)
TOTAL PROTEIN: 7.1 g/dL (ref 6.0–8.3)
Total Bilirubin: 0.6 mg/dL (ref 0.2–1.2)

## 2015-04-26 LAB — BASIC METABOLIC PANEL
BUN: 12 mg/dL (ref 6–23)
CALCIUM: 9.4 mg/dL (ref 8.4–10.5)
CO2: 32 mEq/L (ref 19–32)
Chloride: 106 mEq/L (ref 96–112)
Creatinine, Ser: 0.81 mg/dL (ref 0.40–1.20)
GFR: 74.69 mL/min (ref >60.00)
Glucose, Bld: 92 mg/dL (ref 70–99)
Potassium: 4.5 mEq/L (ref 3.5–5.1)
SODIUM: 142 meq/L (ref 135–145)

## 2015-04-26 LAB — CBC WITH DIFFERENTIAL/PLATELET
BASOS ABS: 0.1 10*3/uL (ref 0.0–0.1)
Basophils Relative: 0.8 % (ref 0.0–3.0)
Eosinophils Absolute: 0.2 10*3/uL (ref 0.0–0.7)
Eosinophils Relative: 2.2 % (ref 0.0–5.0)
HCT: 43.1 % (ref 36.0–46.0)
Hemoglobin: 14.3 g/dL (ref 12.0–15.0)
LYMPHS ABS: 3.3 10*3/uL (ref 0.7–4.0)
Lymphocytes Relative: 34.9 % (ref 12.0–46.0)
MCHC: 33.1 g/dL (ref 30.0–36.0)
MCV: 91.1 fl (ref 78.0–100.0)
MONO ABS: 0.7 10*3/uL (ref 0.1–1.0)
Monocytes Relative: 7.3 % (ref 3.0–12.0)
NEUTROS ABS: 5.2 10*3/uL (ref 1.4–7.7)
NEUTROS PCT: 54.8 % (ref 43.0–77.0)
PLATELETS: 295 10*3/uL (ref 150.0–400.0)
RBC: 4.73 Mil/uL (ref 3.87–5.11)
RDW: 12.9 % (ref 11.5–15.5)
WBC: 9.4 10*3/uL (ref 4.0–10.5)

## 2015-04-26 LAB — LIPID PANEL
Cholesterol: 134 mg/dL (ref 0–200)
HDL: 50.8 mg/dL (ref >39.00)
LDL CALC: 66 mg/dL (ref 0–99)
NONHDL: 83.63
Total CHOL/HDL Ratio: 3
Triglycerides: 88 mg/dL (ref 0.0–149.0)
VLDL: 17.6 mg/dL (ref 0.0–40.0)

## 2015-04-26 LAB — TSH: TSH: 0.58 u[IU]/mL (ref 0.35–4.50)

## 2015-04-26 LAB — VITAMIN D 25 HYDROXY (VIT D DEFICIENCY, FRACTURES): VITD: 40.91 ng/mL (ref 30.00–100.00)

## 2015-04-26 MED ORDER — ALENDRONATE SODIUM 70 MG PO TBEF: 1.0000 | EFFERVESCENT_TABLET | ORAL | Status: DC

## 2015-04-26 MED ORDER — LORAZEPAM 1 MG PO TABS: 1.0000 mg | ORAL_TABLET | Freq: Every evening | ORAL | Status: DC | PRN

## 2015-04-26 MED ORDER — MONTELUKAST SODIUM 10 MG PO TABS: 10.0000 mg | ORAL_TABLET | Freq: Every day | ORAL | Status: DC

## 2015-04-26 MED ORDER — FLUTICASONE PROPIONATE 50 MCG/ACT NA SUSP: 2.0000 | Freq: Every day | NASAL | Status: DC

## 2015-04-26 MED ORDER — ATORVASTATIN CALCIUM 10 MG PO TABS: 10.0000 mg | ORAL_TABLET | Freq: Every day | ORAL | Status: DC

## 2015-04-26 MED ORDER — FLUTICASONE-SALMETEROL 250-50 MCG/DOSE IN AEPB: 1.0000 | INHALATION_SPRAY | Freq: Two times a day (BID) | RESPIRATORY_TRACT | Status: DC

## 2015-04-26 NOTE — Progress Notes (Signed)
Subjective:    Patient ID: Pamela Terry, female    DOB: 07-21-1945, 69 y.o.   MRN: [id number]  HPI 69 y/o WF here for a follow up visit and CPX... she has multiple medical problems including COPD, hx adenocarcinoma RLL w/ lobectomy 2000 & no known recurrence, Hyperlipidemia, hx ca cervix w/ hyst 1985, osteopenia, and anxiety...  ~  May 20, 2011:  Yearly ROV & CPX> she has a new Great Dane puppy= Pamela Terry (53moold now); she had Shingles 5/12 on left buttock area, saw TP & Rx w/ Valtrex/ Pred; mild intertrig rash Rx w/ Lotrisone cream prn;  Breathing is stable on Advair, Singular, and CXR remains stable;  Chol looks good on Lip10 & wt down 7# even though she is not following diet, she says;  OK TDAP today & Rx for Shingles vaccine;  See prob list below>>    [facility] +Divertics and three 547mpolyps removed w/ f/u colon planned in 5y33yr.    She saw DrTannenbaum 10/12 for f/u microscoptic hematuria, stress incont, & adrenal adenoma> stable, symptoms mild, no further work up needed, f/u 59yr55yr ~  May 18, 2012:  Yearly ROV Fithianorts that she is doing well- no new complaints or concerns; We reviewed the following medical problems during today's office visit >>     AR> on Allegra180, Flonase; she denies allergy symptoms at this time...    COPD, AB> on Advair250, Singulair10; she denies cough, sput, hemoptysis, SOB, etc...    Hx of Lung Cancer> ex-smoker quit 1997; RLL nodule found 2000 & she had RLLobectomy by DrBurney for poorly diff AdenoCa; oncology eval by DrLiMaud Deedo other therapy rec; no known recurrence to date...    Chol> on ASA81, Atorva10; FLP shows TChol 154, TG 117, HDL 47, LDL 84    Divertics, Polyps> [facility] & several 5mm 22myps, we don't have path, f/u due 5 yrs...    Hx Cancer of the Cervix> remote hx of Ca Cx w/ hyst & 1 ovary in 1985 by DrMcPhail; now followed by DrKRichardson...    Hx  microhematuria> she had f/u DrTannenbaum 10/12- right adrenal adenoma, stress incont, microheme, hx Ca Cx; stable, no changes, f/u 59yr..78yr Osteopenia> on Calcium, VitD2000; [facility]/ [facility]...    Anxiety> on Ativan '1mg'$  prn and this works well for her... We reviewed prob list, meds, xrays and labs> see below for updates >> she had the 2013 Flu shot 10/13 & due for the PNEUMOVAX now- age 89; al67 [facility] normal [facility] size, COPD/E, NAD...  LABS 11/13:  FLP- at goals on Lip10;  Chems- wnl;  CBC- wnl;  TSH=0.49;  VitD=44   ~  April 27, 2013:  Yearly ROV & Pamela Terry hSamyacute symptoms of cough, wheezing/ chest congestion, yellow sput, sinus draining/ pressure, & low grade fever; she feels crummy but denies HA or SOB; Exam reveals prominent wheezing & rhonchi bilat;  We discussed Rx w/ Depo80, Pred-3dtaper, Levaquin (Aligh, Activia), Mucinex, Hycodan...  We reviewed the following medical problems during today's office visit >>     AR> on Allegra180, Flonase; she denies active allergy symptoms at this time...    COPD, AB> on Advair250, Singulair10 regularly; presents 11/14 w/ URI & acute exac as above=> rx w/ Depo/Pred, Levaquin, Mucinex,  Hycodan...    Hx of Lung Cancer> ex-smoker quit 1997; RLL nodule found 2000 & she had RLLobectomy by DrBurney for poorly diff AdenoCa; oncology eval by Maud Deed & no other therapy rec; CXR 11/14 looks good & no known recurrence to date...    Chol> on ASA81, Atorva10; FLP shows TChol 152, TG 76, HDL 41, LDL 96 therefore continue same...    Divertics, Polyps> last colonoscopy 8/12 by [facility] showed divertics & several 98mm polyps, we don't have path, he recommended f/u in 5 yrs...    Hx Cancer of the Cervix> remote hx of Ca Cx w/ hyst & 1 ovary in 1985 by DrMcPhail; now followed by DrKRichardson but she reports overdue & asked to sched  f/u...    Hx microhematuria> she had f/u DrTannenbaum 10/12- right adrenal adenoma, stress incont, microheme, hx Ca Cx; stable, no changes; Pamela Terry says "he released me"...    Osteopenia> on Calcium, VitD2000; she gets BMD[facility]- last 11/13 w/ TScore -2.4 in left [facility]; she refuses Bisphos therapy because "it made my stomach burn"; rec trial Binosto 70mg /wk (samples given).    Anxiety> on Ativan 1mg  prn and this works well for her... We reviewed prob list, meds, xrays and labs> see below for updates >> she had the 2014 Flu shot 10/14...  CXR 11/14 showed norm heart size, clear lungs w/ min scarring at bases, no lesions seen, NAD...  LABS 11/14:  FLP- at goals on atorva10;  Chems- wnl;  CBC- wnl;  TSH=0/17, not on meds, r/o hyperthy => repeat w/ FreeT3 & FreeT4...  ADDENDUM>>  Repeat TFTs 11/14 showed TSH=0.11 but FreeT3=2.6 (2.3-4.2) and FreeT4=1.01 (0.60-1.60)... We will refer to Endocrine for consult...  ~  April 27, 2014:  Yearly Pamela Terry reports "everything is good"; ROS notes some vague intermittent fleeting & self-limited SSCP, she is not concerned & declines any cardiac eval at this time; We reviewed the following medical problems during today's office visit >>     AR> on Allegra180, Flonase; she denies active allergy symptoms at this time...    COPD, AB> on Advair250, Singulair10 regularly; seen 11/14 w/ URI & acute exac as above=> rx w/ Depo/Pred, Levaquin, Mucinex, Hycodan & resolved; no prob over the last yr & not using the Advair regularly (ave 1-2 per week)...    Hx of Lung Cancer> ex-smoker quit 1997; RLL nodule found 2000 & she had RLLobectomy by DrBurney for poorly diff AdenoCa; oncology eval by Maud Deed & no other therapy rec; CXR 11/15 looks good & no known recurrence to date...    Chol> on ASA81, Atorva10; FLP 11/15 shows TChol 202, TG 118, HDL 43, LDL 135... Not as good- contin same med, better diet or will need to incr dose.    Thyroid> she has subclinical  hyperthyroidism w/ suppressed TSH & normal FreeT3 & FreeT4; eval by DrGherghe- stable labs and she follows up every 66mo...    Divertics, Polyps> last colonoscopy 8/12 by [facility] showed divertics & several 76mm polyps, we don't have path, he recommended f/u in 5 yrs...    Hx Cancer of the Cervix> remote hx of Ca Cx w/ hyst & 1ovary removed in 1985 by DrMcPhail; now followed by DrKRichardson but she reports overdue & asked to sched f/u...    Hx microhematuria> she had f/u DrTannenbaum 1/14- right adrenal adenoma, stress incont, microheme, hx Ca Cx; stable & he notes neg CT, cysto, NMP22 in 2/11; no changes & Pamela Terry says "he released me"...    Osteopenia>  on Calcium, VitD2000; she gets BMDs at [facility]- last 11/13 w/ TScore -2.4 in left [facility]; she refused Bisphos therapy because "it made my stomach burn"; she tried Binosto 70mg  in 2014 and tol well but she never filled Rx- prescription re-written & encouraged to take it every week...    Anxiety> on Ativan 1mg  prn and this works well for her... We reviewed prob list, meds, xrays and labs> see below for updates >> she had the 2015 Flu vaccine 10/15...  CXR 11/15 showed norm heart size, calcif in Ao, clear lungs/ NAD, DJD in spine...   EKG 11/15 showed NSR, rate68, min NSSTTWA, NAD...   LABS 11/15:  FLP- not at goals w/ TChol=202 LDL=135;  Chems- wnl;  CBC- wnl;  TSH=0.90;  UA- no change +bl, no rbc, no infection...  ~  April 26, 2015:  1 year ROV & medical recheck... Pamela Terry reports a good yr- no new complaints or concerns; she's had some plantar fasciitis & treated self w/ Finn comfort shoes & this is helping she says; she has seen [employer] in the past for PT exercises... we reviewed the following medical problems during today's office visit >>     AR> on Zyrtek10, Flonase; she denies active allergy symptoms at this time...    COPD, AB> on Advair250, Singulair10 regularly; prev required Depo/Pred, Levaquin, Mucinex, Hycodan for bronchitic  exac; no prob over the last yr & not using the Advair regularly (ave 1-2 per week)...    Hx of Lung Cancer> ex-smoker quit 1997; RLL nodule found 2000 & she had RLLobectomy by DrBurney for poorly diff AdenoCa; oncology eval by Maud Deed & no other therapy rec; f/u CXRs look good & no known recurrence...     Chol> on ASA81, Atorva10; FLP 11/16 shows TChol 134, TG 88, HDL 51, LDL 66... contin same med/ diet/ exercise...    Thyroid> she has subclinical hyperthyroidism w/ suppressed TSH & normal FreeT3 & FreeT4; eval by DrGherghe- stable labs and she follows up every 32mo...    Divertics, Polyps> last colonoscopy 8/[facility] by [facility] showed divertics & several 3mm polyps, we don't have path, he recommended f/u in 5 yrs...    Hx Cancer of the Cervix> remote hx of Ca Cx w/ hyst & 1ovary removed in 1985 by DrMcPhail; now followed by DrKRichardson but she reports overdue & asked to sched f/u...    Hx microhematuria> she had f/u DrTannenbaum 1/14- right adrenal adenoma, stress incont, microheme, hx Ca Cx; stable & he notes neg CT, cysto, NMP22 in 2/11; no changes & Pamela Terry says "he released me"...    Osteoporosis> on Calcium, VitD2000; she gets BMD[facility]- last 10/16 w/ TScore -3.3 in left [facility]; she prev refused Bisphos therapy because "it made my stomach burn"; she tried Binosto 70mg  in 2014 and tol well but she never filled Rx due to $$- given ALENDRONATE70/wk & encouraged compliance, offered endocrine eval.    Anxiety> on Ativan 1mg  prn and this works well for her... EXAM shows Afeb, VSS, O2sat=98% on RA;  HEENT- neg, mallampati2;  Chest- clear w/o w/r/r;  Heart- RR w/o m/r/g;  Abd- soft, nontender, neg;  Ext- neg w/o c/c/e;  Neuro- intact w/o focal deficits...   BMD 04/12/15 at [facility]>  Lowest Tscore= -3.3 in left FemNeck  CXR 04/26/15>  Heart size looks wnl, clear lungs w/ min basialr scarring; degen changes and osteopenia in spine  LABS 04/26/15>  FLP- at goals on Atorva10;  Chems- wnl;  CBC- wnl;   TSH=0.58;  VitD=41... IMP/PLAN>>  Pamela Terry has signif osteoporosis now & needs bone building therapy- ALENDRONATE '70mg'$ /wk- discussed taking 1st thing in AM same day Qwk don't eat or drink for 719yretc... Continue calcium, MVI, VitD supplements; she's already had the 2016 FLU vaccine...           Problem List:    ALLERGIC RHINITIS (ICD-477.9) - on ALLEGRA '180mg'$ /d,  FLONASE 2spQhs...  COPD (ICD-496) &  ASTHMATIC BRONCHITIS, ACUTE (ICD-466.0) - on ADVAIR 250Bid,  SINGULAIR '10mg'$ /d... states doing well, breathing well... denies cough, sputum, hemoptysis, worsening dyspnea,  wheezing, chest pains, snoring, daytime hypersomnolence, etc...  ~  baseline CXR w/ pleural thickening on right (prev surg), NAD... ~  PFT 10/08 showed FVC= 2.69 (108%), FEV1= 1.27 (70%), %1sec= 47, mid-flows= 17%pred. ~  CXR 11/09 & 11/10 showed COPD, NAD. ~  CXR 11/11 showed COPD w/ bullous dis, scar right base, NAD..Marland Kitchen ~  CXR 11/12 showed min scarring RLL, chr changes, NAD..Marland Kitchen ~  CXR 11/13 showed normal heart size, COPD/E, NAD... ~  11/14: presented w/ URI & AB exac> Rx w/ Depo80, Pred-3dtaper, Levaquin (Aligh, Activia), Mucinex, Hycodan... ~  CXR 11/14 showed norm heart size, clear lungs w/ min scarring at bases, no lesions seen, NAD... ~  11/15: she reports doing well, no resp exac, only using the Advair once or twice per week... ~  CXR 11/15 showed norm heart size, calcif in Ao, clear lungs/ NAD, DJD in spine... ~  11/16: she continues to do well on Advair, Singulair; CXR remains clear, NAD...   Hx of ADENOCARCINOMA, LUNG (ICD-162.9) - former very heavy smoker (up to 2ppd) and quit in 1997... routine CXR in 2000 showed a 1.5cm RLL nodule... eval revealed a non-small cell cancer-  s/p RLLobectomy 6/00 by DrBurney for poorly diff AdenoCa... Oncology eval by DMaud Deedw/ no other therapy recommended... no known recurrence w/ yearly f/u since 2000. ~  Follow up CXRs as above...  HYPERCHOLESTEROLEMIA (ICD-272.0) - on LIPITOR  '10mg'$ /d... ~  [place] showed TChol 168,TG 105, HDL 49, LDL 98... rec- same med + better diet! ~  FLP 11/09 on Lip10 showed TChol 143, TG 120, HDL 37, LDL 83 ~  FLP 11/10 on Lip10 showed TChol 156, TG 118, HDL 41, LDL 91 ~  FLP 11/11 on Lip10 showed TChol 150, TG 116, HDL 41, LDL 86 ~  FLP 11/12 on Lip10 showed TChol 137, TG 78, HDL 50, LDL 71 ~  FLP 11/13 on Lip10 showed TChol 154, TG 117, HDL 47, LDL 84  ~  FLP 11/14 on Lip10 showed TChol 152, TG 76, HDL 41, LDL 96  ~  FLP 11/15 on Lip10 showed TChol 202, TG 118, HDL 43, LDL 135... Needs better diet or incr dose! ~  FLP 11/16 on Lip10 showed TChol 134, TG 88, HDL 51, LDL 66  LOW TSH >>  ~  Labs 11/14 showed TSH= 0.17 & she is clinically euthyroid;  Repeat studies showed TSH=0.11 but FreeT3=2.6 (2.3-4.2) and FreeT4=1.01 (0.60-1.60)... We will refer to Endocrine for consult... ~  Now followed by DrGherghe for Endocrine/ Thyroid...  DIVERTICULOSIS OF COLON (ICD-562.10) COLONIC POLYPS (ICD-211.3) - tubular adenoma removed in 1997... [facility] and divertics... f/u planned 552yr ~  Repeat [facility], three 27m53molyps removed, f/u planned 19yr76yr  Hx of CERVICAL CANCER (ICD-180.9) & MICROSCOPIC HEMATURIA (ICD-599.72) - s/p hysterectomy & unilateral oopherectomy for Ca in situ of cervix 1985 by DrMcPhail... she is now  followed by DrKRichardson & had some microscopic hematuria noted> referred to Urology w/ eval DrTannenbaum (neg CT cysto, NMP22) and he continues yearlt ROV...  OSTEOPOROSIS (ICD-733.00) - prev on Actonel but she stopped this on her own & doesn't want to restart bisphos therapy... takes Calcium supplements & Vitamins... ~  BMD at [facility] 9/06 showed TScores -1.9 in Spine,  -1.8 in left THip,  -2.1 in left FemNeck. ~  labs 11/09 showed Vit D level = 32... rec to start OTC Vit D supplement ~1000u daily. ~  labs 11/10 showed Vit D level = 35...  continue daily supplementation. ~  BMD 11/13 at [facility] showed TScore -2.4 in left [facility] & she declines Bisphos therapy, just wants to continue Calcium, MVI, VitD... ~  11/14: on Calcium, VitD2000; she refuses Bisphos therapy because "it made my stomach burn"; rec trial Binosto '70mg'$ /wk (samples given). ~  11/15: she reports that she tolerated the Binosto well but never filled the Rx; encouraged to take it every week along w/ her calcium, MVI, VitD... ~  11/16: BMD at [facility] 03/2015 showed lowest Tscore=-3.3 in left [facility] => rec to start 7 stay on ALENDRONATE '70mg'$  Qwk + calcium, MVI, VitD...  ANXIETY (ICD-300.00) - takes ATIVAN '1mg'$  Qhs...  Health Maintenance: she takes ASA '81mg'$ /d... ~  GI:  followed by [facility] w/ last colon 6/12 w/ several polyps removed; we don't have path reports but she was told f/u 5 yrs... ~  GYN:  followed by DrKRichardson for PAP etc (she sent her to Urology for hematuria)... goes to [facility] for Mammograms & BMDs... ~  Immunizations:  she had a PNEUMOVAX in 2003 & 11/13 at age66... gets yearly FLU shots... She had Shingles vax 1/13 at [facility]...   Past Surgical History  Procedure Laterality Date  . Appendectomy    . Total abdominal hysterectomy w/ bilateral salpingoophorectomy  1985    Dr Ree Edman for Ca of cervix  . Abdominal hysterectomy      in her 44's    Outpatient Encounter Prescriptions as of 04/26/2015  Medication Sig  . Alendronate Sodium (BINOSTO) 70 MG TBEF Take 1 tablet by mouth once a week.  . Ascorbic Acid (VITAMIN C) 500 MG tablet Take 500 mg by mouth daily.    Marland Kitchen aspirin 81 MG tablet Take 81 mg by mouth daily.    Marland Kitchen atorvastatin (LIPITOR) 10 MG tablet Take 1 tablet (10 mg total) by mouth daily.  . calcium-vitamin D (OSCAL 500/200 D-3) 500-200 MG-UNIT per tablet Take 1 tablet by mouth daily.    . Cholecalciferol (VITAMIN D) 2000 UNITS CAPS Take 1 capsule by mouth daily.    . fluticasone (FLONASE) 50 MCG/ACT nasal spray Place 2 sprays into both  nostrils daily.  . Fluticasone-Salmeterol (ADVAIR DISKUS) 250-50 MCG/DOSE AEPB Inhale 1 puff into the lungs every 12 (twelve) hours.  Marland Kitchen LORazepam (ATIVAN) 1 MG tablet Take 1 tablet (1 mg total) by mouth at bedtime as needed.  . montelukast (SINGULAIR) 10 MG tablet Take 1 tablet (10 mg total) by mouth at bedtime.    Allergies  Allergen Reactions  . Augmentin [Amoxicillin-Pot Clavulanate]     Severe diarrhea  . Sulfonamide Derivatives Hives and Rash    Current Medications, Allergies, Past Medical History, Past Surgical History, Family History, and Social History were reviewed in [employer] record.    Review of Systems         See HPI - all other systems neg except as noted... Presents 11/14 w/ cough, congestion,  drainage, wheezing... The patient denies anorexia, fever, weight loss, weight gain, vision loss, decreased hearing, hoarseness, chest pain, syncope, dyspnea on exertion, peripheral edema, headaches, hemoptysis, abdominal pain, melena, hematochezia, severe indigestion/heartburn, hematuria, incontinence, muscle weakness, suspicious skin lesions, transient blindness, difficulty walking, depression, unusual weight change, abnormal bleeding, enlarged lymph nodes, and angioedema.     Objective:   Physical Exam    WD, petite, overweight, 69 y/o WF in NAD... GENERAL:  Alert & oriented; pleasant & cooperative... HEENT:  [place]/AT, EOM-wnl, PERRLA, EACs-clear, TMs-wnl, NOSE-clear drainage , THROAT-clear & wnl. NECK:  Supple w/ fairROM; no JVD; normal carotid impulses w/o bruits; no thyromegaly or nodules palpated; no lymphadenopathy. CHEST:  Decr BS bilat w/ exp wheezing, rhonchi, congestion, etc... HEART:  Regular Rhythm; without murmurs/ rubs/ or gallops detected... ABDOMEN:  Soft & nontender; normal bowel sounds; no organomegaly or masses palpated... EXT: without deformities, mild arthritic changes; no varicose veins/ venous insuffic/ or edema. NEURO:   no focal  deficits noted.   RADIOLOGY DATA:  Reviewed in the EPIC EMR & discussed w/ the patient...  LABORATORY DATA:  Reviewed in the EPIC EMR & discussed w/ the patient...   Assessment & Plan:    CPX>  Stable- CXR, & Fasting blood work all looks good...  COPD/ AB>  Ex-smoker, still uses the gum!  CXR is clear;  11/14>   presented 11/14 w/ AB exac & we will treat w/ depo/Pred, Levaquin, Mucinex, Hycodan...  Hx AdenoCa Lung> s/p RLLobectomy 2000>  No known recurrence & f/u CXR is ok...  CHOL>  on Lip10 + diet efforts; needs better diet or incr dose w/ LDL at 135...  Hx Cervical Cancer> s/p Hyst & unilat oopherectomy 1985>  Followed by GYN...  Osteoporosis> BMD[facility] done [facility] & Tscaore= -2.4; she has refused Bisphos rx due to "stomach burning" & rec to try Binosto70/wk (try samples)... 11/16>   BMD at [facility] w/ lowest Tscore-3.3 in left [facility] => start ALENDRONATE '70mg'$ /wk + calcium, MVI, VitD; offered endocrine second opinion...  Anxiety>  She uses Ativan as needed...   Patient's Medications  New Prescriptions   No medications on file  Previous Medications   ASCORBIC ACID (VITAMIN C) 500 MG TABLET    Take 500 mg by mouth daily.     ASPIRIN 81 MG TABLET    Take 81 mg by mouth daily.     CALCIUM-VITAMIN D (OSCAL 500/200 D-3) 500-200 MG-UNIT PER TABLET    Take 1 tablet by mouth daily.     CHOLECALCIFEROL (VITAMIN D) 2000 UNITS CAPS    Take 1 capsule by mouth daily.    Modified Medications   Modified Medication Previous Medication   ALENDRONATE SODIUM (BINOSTO) 70 MG TBEF Alendronate Sodium (BINOSTO) 70 MG TBEF      Take 1 tablet by mouth once a week.    Take 1 tablet by mouth once a week.   ATORVASTATIN (LIPITOR) 10 MG TABLET atorvastatin (LIPITOR) 10 MG tablet      Take 1 tablet (10 mg total) by mouth daily.    TAKE 1 TABLET BY MOUTH DAILY   FLUTICASONE (FLONASE) 50 MCG/ACT NASAL SPRAY fluticasone (FLONASE) 50 MCG/ACT nasal spray      Place 2 sprays into both nostrils daily.    Place  2 sprays into both nostrils daily.   FLUTICASONE-SALMETEROL (ADVAIR DISKUS) 250-50 MCG/DOSE AEPB Fluticasone-Salmeterol (ADVAIR DISKUS) 250-50 MCG/DOSE AEPB      Inhale 1 puff into the lungs every 12 (twelve) hours.    Inhale 1  puff into the lungs every 12 (twelve) hours.   LORAZEPAM (ATIVAN) 1 MG TABLET LORazepam (ATIVAN) 1 MG tablet      Take 1 tablet (1 mg total) by mouth at bedtime as needed.    Take 1 tablet (1 mg total) by mouth at bedtime as needed.   MONTELUKAST (SINGULAIR) 10 MG TABLET montelukast (SINGULAIR) 10 MG tablet      Take 1 tablet (10 mg total) by mouth at bedtime.    TAKE 1 TABLET BY MOUTH AT BEDTIME  Discontinued Medications   FEXOFENADINE (ALLEGRA) 180 MG TABLET    Take 180 mg by mouth daily.     LEVOFLOXACIN (LEVAQUIN) 500 MG TABLET    Take 1 tablet (500 mg total) by mouth daily.   METHYLPREDNISOLONE (MEDROL DOSEPAK) 4 MG TBPK TABLET    Take as directed

## 2015-04-26 NOTE — Patient Instructions (Signed)
Today we updated your med list in our EPIC system...    Continue your current medications the same...  Today we checked your follow up CXR & FASTING blood work...    We will contact you w/ the results when available...   We refilled your meds per request...  Call for any questions...  Let's plan a follow up visit in 36yr sooner if needed for problems..Marland KitchenMarland Kitchen

## 2015-04-27 ENCOUNTER — Other Ambulatory Visit: Payer: Self-pay | Admitting: Pulmonary Disease

## 2015-04-27 MED ORDER — ALENDRONATE SODIUM 70 MG PO TABS: 70.0000 mg | ORAL_TABLET | ORAL | Status: DC

## 2015-07-10 ENCOUNTER — Ambulatory Visit: Payer: Medicare Other | Admitting: Internal Medicine

## 2015-08-01 ENCOUNTER — Ambulatory Visit: Payer: Medicare Other | Admitting: Internal Medicine

## 2015-08-09 DIAGNOSIS — H26493 Other secondary cataract, bilateral: Secondary | ICD-10-CM | POA: Diagnosis not present

## 2015-08-09 DIAGNOSIS — H02831 Dermatochalasis of right upper eyelid: Secondary | ICD-10-CM | POA: Diagnosis not present

## 2015-08-09 DIAGNOSIS — D3131 Benign neoplasm of right choroid: Secondary | ICD-10-CM | POA: Diagnosis not present

## 2015-08-09 DIAGNOSIS — H02834 Dermatochalasis of left upper eyelid: Secondary | ICD-10-CM | POA: Diagnosis not present

## 2015-08-17 DIAGNOSIS — H26491 Other secondary cataract, right eye: Secondary | ICD-10-CM | POA: Diagnosis not present

## 2015-08-17 DIAGNOSIS — Z961 Presence of intraocular lens: Secondary | ICD-10-CM | POA: Diagnosis not present

## 2015-09-05 ENCOUNTER — Other Ambulatory Visit: Payer: Self-pay | Admitting: Pulmonary Disease

## 2016-02-09 DIAGNOSIS — M722 Plantar fascial fibromatosis: Secondary | ICD-10-CM | POA: Diagnosis not present

## 2016-02-09 DIAGNOSIS — M6701 Short Achilles tendon (acquired), right ankle: Secondary | ICD-10-CM | POA: Diagnosis not present

## 2016-04-16 ENCOUNTER — Other Ambulatory Visit: Payer: Self-pay | Admitting: Pulmonary Disease

## 2016-04-22 ENCOUNTER — Encounter: Payer: Self-pay | Admitting: Pulmonary Disease

## 2016-04-29 ENCOUNTER — Ambulatory Visit: Payer: Medicare Other | Admitting: Pulmonary Disease

## 2016-05-09 ENCOUNTER — Encounter: Payer: Self-pay | Admitting: Pulmonary Disease

## 2016-05-14 ENCOUNTER — Ambulatory Visit (INDEPENDENT_AMBULATORY_CARE_PROVIDER_SITE_OTHER): Payer: Medicare Other | Admitting: Pulmonary Disease

## 2016-05-14 ENCOUNTER — Ambulatory Visit (INDEPENDENT_AMBULATORY_CARE_PROVIDER_SITE_OTHER)
Admission: RE | Admit: 2016-05-14 | Discharge: 2016-05-14 | Disposition: A | Discharge status: Home / Self Care | Payer: Medicare Other | Source: Ambulatory Visit | Attending: Pulmonary Disease | Admitting: Pulmonary Disease

## 2016-05-14 ENCOUNTER — Other Ambulatory Visit (INDEPENDENT_AMBULATORY_CARE_PROVIDER_SITE_OTHER): Payer: Medicare Other

## 2016-05-14 VITALS — BP 138/66 | HR 64 | Temp 98.0°F | Ht <= 58 in | Wt 141.0 lb

## 2016-05-14 DIAGNOSIS — J449 Chronic obstructive pulmonary disease, unspecified: Secondary | ICD-10-CM

## 2016-05-14 DIAGNOSIS — E7800 Pure hypercholesterolemia, unspecified: Secondary | ICD-10-CM

## 2016-05-14 DIAGNOSIS — K5730 Diverticulosis of large intestine without perforation or abscess without bleeding: Secondary | ICD-10-CM

## 2016-05-14 DIAGNOSIS — E559 Vitamin D deficiency, unspecified: Secondary | ICD-10-CM | POA: Diagnosis not present

## 2016-05-14 DIAGNOSIS — D126 Benign neoplasm of colon, unspecified: Secondary | ICD-10-CM

## 2016-05-14 DIAGNOSIS — E0590 Thyrotoxicosis, unspecified without thyrotoxic crisis or storm: Secondary | ICD-10-CM

## 2016-05-14 DIAGNOSIS — Z85118 Personal history of other malignant neoplasm of bronchus and lung: Secondary | ICD-10-CM

## 2016-05-14 DIAGNOSIS — Z23 Encounter for immunization: Secondary | ICD-10-CM

## 2016-05-14 DIAGNOSIS — J4489 Other specified chronic obstructive pulmonary disease: Secondary | ICD-10-CM

## 2016-05-14 DIAGNOSIS — C539 Malignant neoplasm of cervix uteri, unspecified: Secondary | ICD-10-CM

## 2016-05-14 LAB — COMPREHENSIVE METABOLIC PANEL
ALT: 21 U/L (ref 0–35)
AST: 21 U/L (ref 0–37)
Albumin: 4.6 g/dL (ref 3.5–5.2)
Alkaline Phosphatase: 65 U/L (ref 39–117)
BILIRUBIN TOTAL: 0.8 mg/dL (ref 0.2–1.2)
BUN: 16 mg/dL (ref 6–23)
CALCIUM: 10.1 mg/dL (ref 8.4–10.5)
CO2: 31 mEq/L (ref 19–32)
Chloride: 104 mEq/L (ref 96–112)
Creatinine, Ser: 0.94 mg/dL (ref 0.40–1.20)
GFR: 62.7 mL/min (ref >60.00)
Glucose, Bld: 97 mg/dL (ref 70–99)
POTASSIUM: 4 meq/L (ref 3.5–5.1)
Sodium: 142 mEq/L (ref 135–145)
Total Protein: 7.9 g/dL (ref 6.0–8.3)

## 2016-05-14 LAB — LIPID PANEL
CHOL/HDL RATIO: 3
Cholesterol: 150 mg/dL (ref 0–200)
HDL: 57.1 mg/dL (ref >39.00)
LDL Cholesterol: 73 mg/dL (ref 0–99)
NONHDL: 92.69
TRIGLYCERIDES: 100 mg/dL (ref 0.0–149.0)
VLDL: 20 mg/dL (ref 0.0–40.0)

## 2016-05-14 LAB — CBC WITH DIFFERENTIAL/PLATELET
BASOS PCT: 0.6 % (ref 0.0–3.0)
Basophils Absolute: 0.1 10*3/uL (ref 0.0–0.1)
EOS ABS: 0.2 10*3/uL (ref 0.0–0.7)
Eosinophils Relative: 2.1 % (ref 0.0–5.0)
HEMATOCRIT: 43.3 % (ref 36.0–46.0)
Hemoglobin: 14.8 g/dL (ref 12.0–15.0)
LYMPHS ABS: 3.5 10*3/uL (ref 0.7–4.0)
LYMPHS PCT: 35 % (ref 12.0–46.0)
MCHC: 34.2 g/dL (ref 30.0–36.0)
MCV: 88.6 fl (ref 78.0–100.0)
MONOS PCT: 6.4 % (ref 3.0–12.0)
Monocytes Absolute: 0.6 10*3/uL (ref 0.1–1.0)
NEUTROS ABS: 5.6 10*3/uL (ref 1.4–7.7)
NEUTROS PCT: 55.9 % (ref 43.0–77.0)
PLATELETS: 316 10*3/uL (ref 150.0–400.0)
RBC: 4.89 Mil/uL (ref 3.87–5.11)
RDW: 12.7 % (ref 11.5–15.5)
WBC: 10 10*3/uL (ref 4.0–10.5)

## 2016-05-14 LAB — VITAMIN D 25 HYDROXY (VIT D DEFICIENCY, FRACTURES): VITD: 48.53 ng/mL (ref 30.00–100.00)

## 2016-05-14 LAB — TSH: TSH: 0.55 u[IU]/mL (ref 0.35–4.50)

## 2016-05-14 MED ORDER — FLUTICASONE-SALMETEROL 250-50 MCG/DOSE IN AEPB: 1.0000 | INHALATION_SPRAY | Freq: Two times a day (BID) | RESPIRATORY_TRACT | 3 refills | Status: DC

## 2016-05-14 MED ORDER — ATORVASTATIN CALCIUM 10 MG PO TABS: 10.0000 mg | ORAL_TABLET | Freq: Every day | ORAL | 3 refills | Status: DC

## 2016-05-14 MED ORDER — MONTELUKAST SODIUM 10 MG PO TABS: 10.0000 mg | ORAL_TABLET | Freq: Every day | ORAL | 3 refills | Status: DC

## 2016-05-14 MED ORDER — LORAZEPAM 1 MG PO TABS
1.0000 mg | ORAL_TABLET | Freq: Every evening | ORAL | 5 refills | Status: DC | PRN
Start: 2016-05-14 — End: 2016-11-11

## 2016-05-14 MED ORDER — FLUTICASONE PROPIONATE 50 MCG/ACT NA SUSP: 2.0000 | Freq: Every day | NASAL | 3 refills | Status: DC

## 2016-05-14 MED ORDER — ALENDRONATE SODIUM 70 MG PO TABS: 70.0000 mg | ORAL_TABLET | ORAL | 3 refills | Status: DC

## 2016-05-14 NOTE — Patient Instructions (Signed)
Today we updated your med list in our EPIC system...    Continue your current medications the same...  Today we checked your follow up CXR & FASTING blood work...    We will contact you w/ the results when available...   Keep up the good work w/ your DIET & EXERCISE program...  Call for any questions...  Let's plan a follow up visit in 31yr sooner if needed for problems..Marland KitchenMarland Kitchen

## 2016-05-20 ENCOUNTER — Telehealth: Payer: Self-pay | Admitting: Pulmonary Disease

## 2016-05-20 NOTE — Progress Notes (Signed)
Called and spoke to pt. Informed pt of the results and recs of CXR and labs per SN. Pt verbalized understanding and denied any further questions or concerns at this time.

## 2016-05-20 NOTE — Telephone Encounter (Signed)
Called and spoke to pt. Informed pt of the results and recs of CXR and labs per SN. Pt verbalized understanding and denied any further questions or concerns at this time.    Notes Recorded by Noralee Space, MD on 05/14/2016 at 2:28 PM EST Please notify patient>  CXR looks OK, no acute changes... Norm heart size, scattered areas of scarring, lungs are otherw clear...  Notes Recorded by Noralee Space, MD on 05/14/2016 at 2:25 PM EST Please notify patient>  FLP looks great- all parameters at goal on Lip10, continue same... Chems, CBC, Thyroid and VitD- all look good, continue same meds... VitD= 48 (improved) therefore continue the current supplement regimen.Marland KitchenMarland Kitchen

## 2016-07-29 ENCOUNTER — Telehealth: Payer: Self-pay | Admitting: Pulmonary Disease

## 2016-07-29 MED ORDER — AZITHROMYCIN 250 MG PO TABS: ORAL_TABLET | ORAL | 0 refills | Status: DC

## 2016-07-29 NOTE — Telephone Encounter (Signed)
Spoke with pt. States that she possible has a URI. Reports sinus drainage, cough, ear pain. Cough is non productive. Denies fever, SOB, chest tightness or wheezing. Symptoms started 2 days ago. Has not tried any OTC medications. Would like SN's recommendations.  SN - please advise. Thanks.  Allergies  Allergen Reactions  . Augmentin [Amoxicillin-Pot Clavulanate]     Severe diarrhea  . Sulfonamide Derivatives Hives and Rash   Current Outpatient Prescriptions on File Prior to Visit  Medication Sig Dispense Refill  . alendronate (FOSAMAX) 70 MG tablet Take 1 tablet (70 mg total) by mouth once a week. Take with a full glass of water on an empty stomach. 12 tablet 3  . Ascorbic Acid (VITAMIN C) 500 MG tablet Take 500 mg by mouth daily.      Marland Kitchen aspirin 81 MG tablet Take 81 mg by mouth daily.      Marland Kitchen atorvastatin (LIPITOR) 10 MG tablet Take 1 tablet (10 mg total) by mouth daily. 90 tablet 3  . calcium-vitamin D (OSCAL 500/200 D-3) 500-200 MG-UNIT per tablet Take 1 tablet by mouth daily.      . Cholecalciferol (VITAMIN D) 2000 UNITS CAPS Take 1 capsule by mouth daily.      . fluticasone (FLONASE) 50 MCG/ACT nasal spray Place 2 sprays into both nostrils daily. 48 g 3  . Fluticasone-Salmeterol (ADVAIR DISKUS) 250-50 MCG/DOSE AEPB Inhale 1 puff into the lungs every 12 (twelve) hours. 3 each 3  . LORazepam (ATIVAN) 1 MG tablet Take 1 tablet (1 mg total) by mouth at bedtime as needed. 90 tablet 5  . montelukast (SINGULAIR) 10 MG tablet Take 1 tablet (10 mg total) by mouth at bedtime. 90 tablet 3   No current facility-administered medications on file prior to visit.

## 2016-07-29 NOTE — Telephone Encounter (Signed)
Per SN---  ZPak  #1  Take as directed otc mucinex 600 mg 1-2 tabs BID Fluids Tylenol Rest  Called and spoke with pt and she is aware of SN recs.

## 2016-08-19 DIAGNOSIS — H26493 Other secondary cataract, bilateral: Secondary | ICD-10-CM | POA: Diagnosis not present

## 2016-08-19 DIAGNOSIS — H16223 Keratoconjunctivitis sicca, not specified as Sjogren's, bilateral: Secondary | ICD-10-CM | POA: Diagnosis not present

## 2016-08-19 DIAGNOSIS — H04123 Dry eye syndrome of bilateral lacrimal glands: Secondary | ICD-10-CM | POA: Diagnosis not present

## 2016-08-19 DIAGNOSIS — D3131 Benign neoplasm of right choroid: Secondary | ICD-10-CM | POA: Diagnosis not present

## 2016-11-11 ENCOUNTER — Other Ambulatory Visit: Payer: Self-pay | Admitting: Pulmonary Disease

## 2016-11-13 ENCOUNTER — Telehealth: Payer: Self-pay | Admitting: Pulmonary Disease

## 2016-11-13 NOTE — Telephone Encounter (Signed)
rx was called in earlier today per pt chart. Called pharmacy, verified that rx was called in.  Nothing further needed.

## 2016-12-11 DIAGNOSIS — H26493 Other secondary cataract, bilateral: Secondary | ICD-10-CM | POA: Diagnosis not present

## 2016-12-11 DIAGNOSIS — D3131 Benign neoplasm of right choroid: Secondary | ICD-10-CM | POA: Diagnosis not present

## 2016-12-11 DIAGNOSIS — H02831 Dermatochalasis of right upper eyelid: Secondary | ICD-10-CM | POA: Diagnosis not present

## 2016-12-11 DIAGNOSIS — H16223 Keratoconjunctivitis sicca, not specified as Sjogren's, bilateral: Secondary | ICD-10-CM | POA: Diagnosis not present

## 2016-12-11 DIAGNOSIS — H04123 Dry eye syndrome of bilateral lacrimal glands: Secondary | ICD-10-CM | POA: Diagnosis not present

## 2017-05-07 ENCOUNTER — Encounter: Payer: Self-pay | Admitting: Internal Medicine

## 2017-05-07 ENCOUNTER — Ambulatory Visit: Payer: Medicare Other | Admitting: Internal Medicine

## 2017-05-07 ENCOUNTER — Other Ambulatory Visit (INDEPENDENT_AMBULATORY_CARE_PROVIDER_SITE_OTHER): Payer: Medicare Other

## 2017-05-07 VITALS — BP 142/86 | HR 69 | Temp 98.2°F | Ht <= 58 in | Wt 140.0 lb

## 2017-05-07 DIAGNOSIS — Z0000 Encounter for general adult medical examination without abnormal findings: Secondary | ICD-10-CM

## 2017-05-07 DIAGNOSIS — Z1159 Encounter for screening for other viral diseases: Secondary | ICD-10-CM

## 2017-05-07 DIAGNOSIS — Z0001 Encounter for general adult medical examination with abnormal findings: Secondary | ICD-10-CM | POA: Insufficient documentation

## 2017-05-07 LAB — HEPATIC FUNCTION PANEL
ALBUMIN: 4.3 g/dL (ref 3.5–5.2)
ALT: 15 U/L (ref 0–35)
AST: 18 U/L (ref 0–37)
Alkaline Phosphatase: 61 U/L (ref 39–117)
BILIRUBIN TOTAL: 0.8 mg/dL (ref 0.2–1.2)
Bilirubin, Direct: 0.2 mg/dL (ref 0.0–0.3)
Total Protein: 7.4 g/dL (ref 6.0–8.3)

## 2017-05-07 LAB — URINALYSIS, ROUTINE W REFLEX MICROSCOPIC
Bilirubin Urine: NEGATIVE
Ketones, ur: NEGATIVE
Nitrite: NEGATIVE
PH: 5.5 (ref 5.0–8.0)
Specific Gravity, Urine: >=1.03 — AB (ref 1.000–1.030)
TOTAL PROTEIN, URINE-UPE24: NEGATIVE
Urine Glucose: NEGATIVE
Urobilinogen, UA: 0.2 (ref 0.0–1.0)

## 2017-05-07 LAB — LIPID PANEL
CHOL/HDL RATIO: 3
Cholesterol: 121 mg/dL (ref 0–200)
HDL: 44.6 mg/dL (ref >39.00)
LDL CALC: 64 mg/dL (ref 0–99)
NONHDL: 76.79
TRIGLYCERIDES: 62 mg/dL (ref 0.0–149.0)
VLDL: 12.4 mg/dL (ref 0.0–40.0)

## 2017-05-07 LAB — CBC WITH DIFFERENTIAL/PLATELET
BASOS PCT: 0.9 % (ref 0.0–3.0)
Basophils Absolute: 0.1 10*3/uL (ref 0.0–0.1)
EOS ABS: 0.1 10*3/uL (ref 0.0–0.7)
Eosinophils Relative: 1.4 % (ref 0.0–5.0)
HEMATOCRIT: 41.8 % (ref 36.0–46.0)
HEMOGLOBIN: 13.9 g/dL (ref 12.0–15.0)
LYMPHS PCT: 29.6 % (ref 12.0–46.0)
Lymphs Abs: 2.9 10*3/uL (ref 0.7–4.0)
MCHC: 33.2 g/dL (ref 30.0–36.0)
MCV: 92 fl (ref 78.0–100.0)
MONOS PCT: 6.9 % (ref 3.0–12.0)
Monocytes Absolute: 0.7 10*3/uL (ref 0.1–1.0)
NEUTROS ABS: 6 10*3/uL (ref 1.4–7.7)
Neutrophils Relative %: 61.2 % (ref 43.0–77.0)
PLATELETS: 314 10*3/uL (ref 150.0–400.0)
RBC: 4.54 Mil/uL (ref 3.87–5.11)
RDW: 13.3 % (ref 11.5–15.5)
WBC: 9.8 10*3/uL (ref 4.0–10.5)

## 2017-05-07 LAB — BASIC METABOLIC PANEL
BUN: 15 mg/dL (ref 6–23)
CHLORIDE: 106 meq/L (ref 96–112)
CO2: 29 meq/L (ref 19–32)
Calcium: 9.8 mg/dL (ref 8.4–10.5)
Creatinine, Ser: 0.83 mg/dL (ref 0.40–1.20)
GFR: 72.18 mL/min (ref >60.00)
GLUCOSE: 99 mg/dL (ref 70–99)
Potassium: 4.2 mEq/L (ref 3.5–5.1)
SODIUM: 142 meq/L (ref 135–145)

## 2017-05-07 LAB — TSH: TSH: 0.45 u[IU]/mL (ref 0.35–4.50)

## 2017-05-07 MED ORDER — LORAZEPAM 1 MG PO TABS
1.0000 mg | ORAL_TABLET | Freq: Every evening | ORAL | 1 refills | Status: DC | PRN
Start: 2017-05-07 — End: 2017-11-05

## 2017-05-07 MED ORDER — ALENDRONATE SODIUM 70 MG PO TABS: 70.0000 mg | ORAL_TABLET | ORAL | 3 refills | Status: DC

## 2017-05-07 MED ORDER — ATORVASTATIN CALCIUM 10 MG PO TABS: 10.0000 mg | ORAL_TABLET | Freq: Every day | ORAL | 3 refills | Status: DC

## 2017-05-07 MED ORDER — ATORVASTATIN CALCIUM 10 MG PO TABS
10.0000 mg | ORAL_TABLET | Freq: Every day | ORAL | 3 refills | Status: DC
Start: 2017-05-07 — End: 2018-05-11

## 2017-05-07 MED ORDER — MONTELUKAST SODIUM 10 MG PO TABS: 10.0000 mg | ORAL_TABLET | Freq: Every day | ORAL | 3 refills | Status: DC

## 2017-05-07 MED ORDER — FLUTICASONE PROPIONATE 50 MCG/ACT NA SUSP: 2.0000 | Freq: Every day | NASAL | 3 refills | Status: DC

## 2017-05-07 NOTE — Patient Instructions (Signed)

## 2017-05-07 NOTE — Progress Notes (Signed)
Subjective:    Patient ID: Pamela Terry, female    DOB: 04-28-1946, 71 y.o.   MRN: [id number]  HPI  Here for wellness and f/u;  Overall doing ok;  Pt denies Chest pain, worsening SOB, DOE, wheezing, orthopnea, PND, worsening LE edema, palpitations, dizziness or syncope.  Pt denies neurological change such as new headache, facial or extremity weakness.  Pt denies polydipsia, polyuria, or low sugar symptoms. Pt states overall good compliance with treatment and medications, good tolerability, and has been trying to follow appropriate diet.  Pt denies worsening depressive symptoms, suicidal ideation or panic. No fever, night sweats, wt loss, loss of appetite, or other constitutional symptoms.  Pt states good ability with ADL's, has low fall risk, home safety reviewed and adequate, no other significant changes in hearing or vision, and only occasionally active with exercise.  Plans to have mammogram soon.  Has f/u for yearly cxr with Dr Lenna Gilford later this month Past Medical History:  Diagnosis Date  . Acute asthmatic bronchitis   . Adenocarcinoma, lung ([place])   . Allergic rhinitis   . Anxiety   . Carcinoma in situ of endocrine gland   . Colon polyp   . COPD (chronic obstructive pulmonary disease) ([place])   . Diverticulosis of colon   . History of cervical cancer   . Hypercholesterolemia   . Microscopic hematuria   . Osteoporosis    Past Surgical History:  Procedure Laterality Date  . ABDOMINAL HYSTERECTOMY     in her 30's  . APPENDECTOMY    . TOTAL ABDOMINAL HYSTERECTOMY W/ BILATERAL SALPINGOOPHORECTOMY  1985   Dr Ree Edman for Ca of cervix    reports that she quit smoking about 31 years ago. Her smoking use included cigarettes. She has a 46.00 pack-year smoking history. she has never used smokeless tobacco. She reports that she does not drink alcohol or use drugs. family history includes Asthma in her maternal aunt; Breast cancer in her paternal grandmother; Diabetes in her brother; Heart  disease in her father, maternal grandmother, and paternal grandfather; Hyperlipidemia in her father; Prostate cancer in her maternal grandfather; Rheum arthritis in her mother; Stroke in her father. Allergies  Allergen Reactions  . Augmentin [Amoxicillin-Pot Clavulanate]     Severe diarrhea  . Sulfonamide Derivatives Hives and Rash   Current Outpatient Medications on File Prior to Visit  Medication Sig Dispense Refill  . aspirin 81 MG tablet Take 81 mg by mouth daily.      . calcium-vitamin D (OSCAL 500/200 D-3) 500-200 MG-UNIT per tablet Take 1 tablet by mouth daily.      . Cholecalciferol (VITAMIN D) 2000 UNITS CAPS Take 1 capsule by mouth daily.      . Fluticasone-Salmeterol (ADVAIR DISKUS) 250-50 MCG/DOSE AEPB Inhale 1 puff into the lungs every 12 (twelve) hours. 3 each 3   No current facility-administered medications on file prior to visit.    Review of Systems Constitutional: Negative for other unusual diaphoresis, sweats, appetite or weight changes HENT: Negative for other worsening hearing loss, ear pain, facial swelling, mouth sores or neck stiffness.   Eyes: Negative for other worsening pain, redness or other visual disturbance.  Respiratory: Negative for other stridor or swelling Cardiovascular: Negative for other palpitations or other chest pain  Gastrointestinal: Negative for worsening diarrhea or loose stools, blood in stool, distention or other pain Genitourinary: Negative for hematuria, flank pain or other change in urine volume.  Musculoskeletal: Negative for myalgias or other joint swelling.  Skin: Negative for  other color change, or other wound or worsening drainage.  Neurological: Negative for other syncope or numbness. Hematological: Negative for other adenopathy or swelling Psychiatric/Behavioral: Negative for hallucinations, other worsening agitation, SI, self-injury, or new decreased concentration All other system neg per pt    Objective:   Physical Exam BP  (!) 142/86   Pulse 69   Temp 98.2 F (36.8 C) (Oral)   Ht 4\' 10"  (1.473 m)   Wt 140 lb (63.5 kg)   SpO2 98%   BMI 29.26 kg/m  VS noted,  Constitutional: Pt is oriented to person, place, and time. Appears well-developed and well-nourished, in no significant distress and comfortable Head: Normocephalic and atraumatic  Eyes: Conjunctivae and EOM are normal. Pupils are equal, round, and reactive to light Right Ear: External ear normal without discharge Left Ear: External ear normal without discharge Nose: Nose without discharge or deformity Mouth/Throat: Oropharynx is without other ulcerations and moist  Neck: Normal range of motion. Neck supple. No JVD present. No tracheal deviation present or significant neck LA or mass Cardiovascular: Normal rate, regular rhythm, normal heart sounds and intact distal pulses.   Pulmonary/Chest: WOB normal and breath sounds without rales or wheezing  Abdominal: Soft. Bowel sounds are normal. NT. No HSM  Musculoskeletal: Normal range of motion. Exhibits no edema Lymphadenopathy: Has no other cervical adenopathy.  Neurological: Pt is alert and oriented to person, place, and time. Pt has normal reflexes. No cranial nerve deficit. Motor grossly intact, Gait intact Skin: Skin is warm and dry. No rash noted or new ulcerations Psychiatric:  Has normal mood and affect. Behavior is normal without agitation No other exam findings    Assessment & Plan:

## 2017-05-08 LAB — HEPATITIS C ANTIBODY
Hepatitis C Ab: NONREACTIVE
SIGNAL TO CUT-OFF: 0.01 (ref <1.00)

## 2017-05-10 ENCOUNTER — Encounter: Payer: Self-pay | Admitting: Internal Medicine

## 2017-05-10 NOTE — Assessment & Plan Note (Signed)

## 2017-05-11 ENCOUNTER — Encounter: Payer: Self-pay | Admitting: Pulmonary Disease

## 2017-05-11 NOTE — Progress Notes (Signed)
Subjective:    Patient ID: Pamela Terry, female    DOB: 1945/07/11, 71 y.o.   MRN: [id number]  HPI 71 y/o WF here for a follow up visit and CPX... she has multiple medical problems including COPD, hx adenocarcinoma RLL w/ lobectomy 2000 & no known recurrence, Hyperlipidemia, hx ca cervix w/ hyst 1985, osteopenia, and anxiety...  ~  May 20, 2011:  Yearly ROV & CPX> she has a new Great Dane puppy= Pamela Terry (57mo old now); she had Shingles 5/12 on left buttock area, saw TP & Rx w/ Valtrex/ Pred; mild intertrig rash Rx w/ Lotrisone cream prn;  Breathing is stable on Advair, Singular, and CXR remains stable;  Chol looks good on Lip10 & wt down 7# even though she is not following diet, she says;  OK TDAP today & Rx for Shingles vaccine;  See prob list below>>    She saw [facility] 8/12 for f/u colonoscopy> +Divertics and three 31mm polyps removed w/ f/u colon planned in 21yrs...    She saw DrTannenbaum 10/12 for f/u microscoptic hematuria, stress incont, & adrenal adenoma> stable, symptoms mild, no further work up needed, f/u 23yr...  ~  May 18, 2012:  Yearly [place] reports that she is doing well- no new complaints or concerns; We reviewed the following medical problems during today's office visit >>     AR> on Allegra180, Flonase; she denies allergy symptoms at this time...    COPD, AB> on Advair250, Singulair10; she denies cough, sput, hemoptysis, SOB, etc...    Hx of Lung Cancer> ex-smoker quit 1997; RLL nodule found 2000 & she had RLLobectomy by DrBurney for poorly diff AdenoCa; oncology eval by Maud Deed & no other therapy rec; no known recurrence to date...    Chol> on ASA81, Atorva10; FLP shows TChol 154, TG 117, HDL 47, LDL 84    Divertics, Polyps> last colonoscopy 8/12 by [facility] showed divertics & several 43mm polyps, we don't have path, f/u due 5 yrs...    Hx Cancer of the Cervix> remote hx of Ca Cx w/ hyst & 1 ovary in 1985 by DrMcPhail; now followed by DrKRichardson...    Hx  microhematuria> she had f/u DrTannenbaum 10/12- right adrenal adenoma, stress incont, microheme, hx Ca Cx; stable, no changes, f/u 76yr...    Osteopenia> on Calcium, VitD2000; she gets BMDs at [facility]- last 11/13 w/ TScore -2.4 in left [facility]; she refuses Bisphos therapy...    Anxiety> on Ativan 1mg  prn and this works well for her... We reviewed prob list, meds, xrays and labs> see below for updates >> she had the 2013 Flu shot 10/13 & due for the PNEUMOVAX now- age 63; also received the Shingles vaccine 1/13 at [facility]...  CXR 11/13 showed normal heart size, COPD/E, NAD...  LABS 11/13:  FLP- at goals on Lip10;  Chems- wnl;  CBC- wnl;  TSH=0.49;  VitD=44   ~  April 27, 2013:  Yearly ROV & Pamela Terry has acute symptoms of cough, wheezing/ chest congestion, yellow sput, sinus draining/ pressure, & low grade fever; she feels crummy but denies HA or SOB; Exam reveals prominent wheezing & rhonchi bilat;  We discussed Rx w/ Depo80, Pred-3dtaper, Levaquin (Aligh, Activia), Mucinex, Hycodan...  We reviewed the following medical problems during today's office visit >>     AR> on Allegra180, Flonase; she denies active allergy symptoms at this time...    COPD, AB> on Advair250, Singulair10 regularly; presents 11/14 w/ URI & acute exac as above=> rx w/ Depo/Pred, Levaquin, Mucinex,  Hycodan...    Hx of Lung Cancer> ex-smoker quit 1997; RLL nodule found 2000 & she had RLLobectomy by DrBurney for poorly diff AdenoCa; oncology eval by Maud Deed & no other therapy rec; CXR 11/14 looks good & no known recurrence to date...    Chol> on ASA81, Atorva10; FLP shows TChol 152, TG 76, HDL 41, LDL 96 therefore continue same...    Divertics, Polyps> last colonoscopy 8/12 by [facility] showed divertics & several 35mm polyps, we don't have path, he recommended f/u in 5 yrs...    Hx Cancer of the Cervix> remote hx of Ca Cx w/ hyst & 1 ovary in 1985 by DrMcPhail; now followed by DrKRichardson but she reports overdue & asked to sched  f/u...    Hx microhematuria> she had f/u DrTannenbaum 10/12- right adrenal adenoma, stress incont, microheme, hx Ca Cx; stable, no changes; [place] says "he released me"...    Osteopenia> on Calcium, VitD2000; she gets BMDs at [facility]- last 11/13 w/ TScore -2.4 in left [facility]; she refuses Bisphos therapy because "it made my stomach burn"; rec trial Binosto 70mg /wk (samples given).    Anxiety> on Ativan 1mg  prn and this works well for her... We reviewed prob list, meds, xrays and labs> see below for updates >> she had the 2014 Flu shot 10/14...  CXR 11/14 showed norm heart size, clear lungs w/ min scarring at bases, no lesions seen, NAD...  LABS 11/14:  FLP- at goals on atorva10;  Chems- wnl;  CBC- wnl;  TSH=0/17, not on meds, r/o hyperthy => repeat w/ FreeT3 & FreeT4...  ADDENDUM>>  Repeat TFTs 11/14 showed TSH=0.11 but FreeT3=2.6 (2.3-4.2) and FreeT4=1.01 (0.60-1.60)... We will refer to Endocrine for consult...  ~  April 27, 2014:  Yearly [place] reports "everything is good"; ROS notes some vague intermittent fleeting & self-limited SSCP, she is not concerned & declines any cardiac eval at this time; We reviewed the following medical problems during today's office visit >>     AR> on Allegra180, Flonase; she denies active allergy symptoms at this time...    COPD, AB> on Advair250, Singulair10 regularly; seen 11/14 w/ URI & acute exac as above=> rx w/ Depo/Pred, Levaquin, Mucinex, Hycodan & resolved; no prob over the last yr & not using the Advair regularly (ave 1-2 per week)...    Hx of Lung Cancer> ex-smoker quit 1997; RLL nodule found 2000 & she had RLLobectomy by DrBurney for poorly diff AdenoCa; oncology eval by Maud Deed & no other therapy rec; CXR 11/15 looks good & no known recurrence to date...    Chol> on ASA81, Atorva10; FLP 11/15 shows TChol 202, TG 118, HDL 43, LDL 135... Not as good- contin same med, better diet or will need to incr dose.    Thyroid> she has subclinical  hyperthyroidism w/ suppressed TSH & normal FreeT3 & FreeT4; eval by DrGherghe- stable labs and she follows up every 83mo...    Divertics, Polyps> last colonoscopy 8/12 by [facility] showed divertics & several 48mm polyps, we don't have path, he recommended f/u in 5 yrs...    Hx Cancer of the Cervix> remote hx of Ca Cx w/ hyst & 1ovary removed in 1985 by DrMcPhail; now followed by DrKRichardson but she reports overdue & asked to sched f/u...    Hx microhematuria> she had f/u DrTannenbaum 1/14- right adrenal adenoma, stress incont, microheme, hx Ca Cx; stable & he notes neg CT, cysto, NMP22 in 2/11; no changes & Pamela Terry says "he released me"...    Osteopenia>  on Calcium, VitD2000; she gets BMDs at [facility]- last 11/13 w/ TScore -2.4 in left [facility]; she refused Bisphos therapy because "it made my stomach burn"; she tried Binosto 70mg  in 2014 and tol well but she never filled Rx- prescription re-written & encouraged to take it every week...    Anxiety> on Ativan 1mg  prn and this works well for her... We reviewed prob list, meds, xrays and labs> see below for updates >> she had the 2015 Flu vaccine 10/15...  CXR 11/15 showed norm heart size, calcif in Ao, clear lungs/ NAD, DJD in spine...   EKG 11/15 showed NSR, rate68, min NSSTTWA, NAD...   LABS 11/15:  FLP- not at goals w/ TChol=202 LDL=135;  Chems- wnl;  CBC- wnl;  TSH=0.90;  UA- no change +bl, no rbc, no infection...  ~  April 26, 2015:  1 year ROV & medical recheck... Pamela Terry reports a good yr- no new complaints or concerns; she's had some plantar fasciitis & treated self w/ Finn comfort shoes & this is helping she says; she has seen [employer] in the past for PT exercises... we reviewed the following medical problems during today's office visit >>     AR> on Zyrtek10, Flonase; she denies active allergy symptoms at this time...    COPD, AB> on Advair250, Singulair10 regularly; prev required Depo/Pred, Levaquin, Mucinex, Hycodan for bronchitic  exac; no prob over the last yr & not using the Advair regularly (ave 1-2 per week)...    Hx of Lung Cancer> ex-smoker quit 1997; RLL nodule found 2000 & she had RLLobectomy by DrBurney for poorly diff AdenoCa; oncology eval by Maud Deed & no other therapy rec; f/u CXRs look good & no known recurrence...     Chol> on ASA81, Atorva10; FLP 11/16 shows TChol 134, TG 88, HDL 51, LDL 66... contin same med/ diet/ exercise...    Thyroid> she has subclinical hyperthyroidism w/ suppressed TSH & normal FreeT3 & FreeT4; eval by DrGherghe- stable labs and she follows up every 56mo...    Divertics, Polyps> last colonoscopy 8/12 by [facility] showed divertics & several 66mm polyps, we don't have path, he recommended f/u in 5 yrs...    Hx Cancer of the Cervix> remote hx of Ca Cx w/ hyst & 1ovary removed in 1985 by DrMcPhail; now followed by DrKRichardson but she reports overdue & asked to sched f/u...    Hx microhematuria> she had f/u DrTannenbaum 1/14- right adrenal adenoma, stress incont, microheme, hx Ca Cx; stable & he notes neg CT, cysto, NMP22 in 2/11; no changes & Pamela Terry says "he released me"...    Osteoporosis> on Calcium, VitD2000; she gets BMDs at [facility]- last 10/16 w/ TScore -3.3 in left [facility]; she prev refused Bisphos therapy because "it made my stomach burn"; she tried Binosto 70mg  in 2014 and tol well but she never filled Rx due to $$- given ALENDRONATE70/wk & encouraged compliance, offered endocrine eval.    Anxiety> on Ativan 1mg  prn and this works well for her... EXAM shows Afeb, VSS, O2sat=98% on RA;  HEENT- neg, mallampati2;  Chest- clear w/o w/r/r;  Heart- RR w/o m/r/g;  Abd- soft, nontender, neg;  Ext- neg w/o c/c/e;  Neuro- intact w/o focal deficits...   BMD 04/12/15 at [facility]>  Lowest Tscore= -3.3 in left FemNeck  CXR 04/26/15>  Heart size looks wnl, clear lungs w/ min basialr scarring; degen changes and osteopenia in spine  LABS 04/26/15>  FLP- at goals on Atorva10;  Chems- wnl;  CBC- wnl;   TSH=0.58;  VitD=41... IMP/PLAN>>  Pamela Terry has signif osteoporosis now & needs bone building therapy- ALENDRONATE 70mg /wk- discussed taking 1st thing in AM same day Qwk don't eat or drink for 70yr etc... Continue calcium, MVI, VitD supplements; she's already had the 2016 FLU vaccine...    ~  May 14, 2016:  Yearly ROV & Pamela Terry is seeing DrJohn for PCP- here for pulm med follow up visit> states that her breathing is good & denies cough, sput, hemoptysis, SOB, CP, palpit, edema;  She exercises by walking but needs an inclement weather indoor plan;  She tells me she is going to retire at 57...  She had the FLU vaccine Oct2017 at work [employer]), she needs the PREVNAR-13 today...  We reviewed the following pulmonary related issues >>     AR> on Zyrtek10, Flonase; she denies active allergy symptoms at this time...    COPD, AB> Hx mod airflow obstruction w/ FEV!=1.27 (70%) in 2008; on Advair250, Singulair10 regularly; prev required Depo/Pred, Levaquin, Mucinex, Hycodan for bronchitic exac; no prob over the last yr but not using the Advair regularly (ave 1-2 per week)...    Hx of Lung Cancer> ex-smoker quit 1997; RLL nodule found 2000 & she had RLLobectomy by DrBurney for poorly diff AdenoCa; oncology eval by Maud Deed & no other therapy rec; f/u CXRs look good & no known recurrence...  EXAM shows Afeb, VSS, O2sat=97% on RA;  HEENT- neg, mallampati2;  Chest- clear w/o w/r/r;  Heart- RR w/o m/r/g;  Abd- soft, nontender, neg;  Ext- neg w/o c/c/e;  Neuro- intact w/o focal deficits.  CXR 05/14/16>  Norm heart size, atherosclerotic changes in Ao, COPD w/ left apical pleuroparenchymal scarring and mild basilar pleuroparenchymal thickening- NAD///   LABS 05/14/16>  FLP- at goals on Atorva10;  Chems- wnl;  CBC- wnl;  TSH=0.55;  VitD=49... IMP/PLAN>>  Pamela Terry remains stable w/ hx GOLD Stage 2 COPD & prev RLLobectomy for adenoCa in 2000; no known recurrence & her breathing is stable; we discussed incr exercise and  continued meds...          Problem List:    ALLERGIC RHINITIS (ICD-477.9) - on ALLEGRA 180mg /d,  FLONASE 2spQhs...  COPD (ICD-496) &  ASTHMATIC BRONCHITIS, ACUTE (ICD-466.0) - on ADVAIR 250Bid,  SINGULAIR 10mg /d... states doing well, breathing well... denies cough, sputum, hemoptysis, worsening dyspnea,  wheezing, chest pains, snoring, daytime hypersomnolence, etc...  ~  baseline CXR w/ pleural thickening on right (prev surg), NAD... ~  PFT 10/08 showed FVC= 2.69 (108%), FEV1= 1.27 (70%), %1sec= 47, mid-flows= 17%pred. ~  CXR 11/09 & 11/10 showed COPD, NAD. ~  CXR 11/11 showed COPD w/ bullous dis, scar right base, NAD.Marland Kitchen. ~  CXR 11/12 showed min scarring RLL, chr changes, NAD.Marland Kitchen. ~  CXR 11/13 showed normal heart size, COPD/E, NAD... ~  11/14: presented w/ URI & AB exac> Rx w/ Depo80, Pred-3dtaper, Levaquin (Aligh, Activia), Mucinex, Hycodan... ~  CXR 11/14 showed norm heart size, clear lungs w/ min scarring at bases, no lesions seen, NAD... ~  11/15: she reports doing well, no resp exac, only using the Advair once or twice per week... ~  CXR 11/15 showed norm heart size, calcif in Ao, clear lungs/ NAD, DJD in spine... ~  11/16: she continues to do well on Advair, Singulair; CXR remains clear, NAD...   Hx of ADENOCARCINOMA, LUNG (ICD-162.9) - former very heavy smoker (up to 2ppd) and quit in 1997... routine CXR in 2000 showed a 1.5cm RLL nodule... eval revealed a non-small cell cancer-  s/p  RLLobectomy 6/00 by DrBurney for poorly diff AdenoCa... Oncology eval by Maud Deed w/ no other therapy recommended... no known recurrence w/ yearly f/u since 2000. ~  Follow up CXRs as above...  HYPERCHOLESTEROLEMIA (ICD-272.0) - on LIPITOR 10mg /d... ~  [place] 10/08 on Lip10 showed TChol 168,TG 105, HDL 49, LDL 98... rec- same med + better diet! ~  FLP 11/09 on Lip10 showed TChol 143, TG 120, HDL 37, LDL 83 ~  FLP 11/10 on Lip10 showed TChol 156, TG 118, HDL 41, LDL 91 ~  FLP 11/11 on Lip10 showed TChol  150, TG 116, HDL 41, LDL 86 ~  FLP 11/12 on Lip10 showed TChol 137, TG 78, HDL 50, LDL 71 ~  FLP 11/13 on Lip10 showed TChol 154, TG 117, HDL 47, LDL 84  ~  FLP 11/14 on Lip10 showed TChol 152, TG 76, HDL 41, LDL 96  ~  FLP 11/15 on Lip10 showed TChol 202, TG 118, HDL 43, LDL 135... Needs better diet or incr dose! ~  FLP 11/16 on Lip10 showed TChol 134, TG 88, HDL 51, LDL 66  LOW TSH >>  ~  Labs 11/14 showed TSH= 0.17 & she is clinically euthyroid;  Repeat studies showed TSH=0.11 but FreeT3=2.6 (2.3-4.2) and FreeT4=1.01 (0.60-1.60)... We will refer to Endocrine for consult... ~  Now followed by DrGherghe for Endocrine/ Thyroid...  DIVERTICULOSIS OF COLON (ICD-562.10) COLONIC POLYPS (ICD-211.3) - tubular adenoma removed in 1997... colonoscopy 6/07 by [facility] showing diminutive polyp and divertics... f/u planned 93yrs. ~  Repeat colonoscopy 8/12 by [facility] showed divertics, three 58mm polyps removed, f/u planned 57yrs...  Hx of CERVICAL CANCER (ICD-180.9) & MICROSCOPIC HEMATURIA (ICD-599.72) - s/p hysterectomy & unilateral oopherectomy for Ca in situ of cervix 1985 by DrMcPhail... she is now followed by DrKRichardson & had some microscopic hematuria noted> referred to Urology w/ eval DrTannenbaum (neg CT cysto, NMP22) and he continues yearlt ROV...  OSTEOPOROSIS (ICD-733.00) - prev on Actonel but she stopped this on her own & doesn't want to restart bisphos therapy... takes Calcium supplements & Vitamins... ~  BMD at [facility] 9/06 showed TScores -1.9 in Spine,  -1.8 in left THip,  -2.1 in left FemNeck. ~  labs 11/09 showed Vit D level = 32... rec to start OTC Vit D supplement ~1000u daily. ~  labs 11/10 showed Vit D level = 35... continue daily supplementation. ~  BMD 11/13 at [facility] showed TScore -2.4 in left [facility] & she declines Bisphos therapy, just wants to continue Calcium, MVI, VitD... ~  11/14: on Calcium, VitD2000; she refuses Bisphos therapy because "it made my stomach burn"; rec  trial Binosto 70mg /wk (samples given). ~  11/15: she reports that she tolerated the Binosto well but never filled the Rx; encouraged to take it every week along w/ her calcium, MVI, VitD... ~  11/16: BMD at [facility] 03/2015 showed lowest Tscore=-3.3 in left [facility] => rec to start 7 stay on ALENDRONATE 70mg  Qwk + calcium, MVI, VitD...  ANXIETY (ICD-300.00) - takes ATIVAN 1mg  Qhs...  Health Maintenance: she takes ASA 81mg /d... ~  GI:  followed by [facility] w/ last colon 6/12 w/ several polyps removed; we don't have path reports but she was told f/u 5 yrs... ~  GYN:  followed by DrKRichardson for PAP etc (she sent her to Urology for hematuria)... goes to [facility] for Mammograms & BMDs... ~  Immunizations:  she had a PNEUMOVAX in 2003 & 11/13 at age66... gets yearly FLU shots... She had Shingles vax 1/13 at [facility].Marland KitchenMarland Kitchen  Past Surgical History:  Procedure Laterality Date  . ABDOMINAL HYSTERECTOMY     in her 30's  . APPENDECTOMY    . TOTAL ABDOMINAL HYSTERECTOMY W/ BILATERAL SALPINGOOPHORECTOMY  1985   Dr Ree Edman for Ca of cervix    Outpatient Encounter Prescriptions as of 04/26/2015  Medication Sig  . Alendronate Sodium (BINOSTO) 70 MG TBEF Take 1 tablet by mouth once a week.  . Ascorbic Acid (VITAMIN C) 500 MG tablet Take 500 mg by mouth daily.    Marland Kitchen aspirin 81 MG tablet Take 81 mg by mouth daily.    Marland Kitchen atorvastatin (LIPITOR) 10 MG tablet Take 1 tablet (10 mg total) by mouth daily.  . calcium-vitamin D (OSCAL 500/200 D-3) 500-200 MG-UNIT per tablet Take 1 tablet by mouth daily.    . Cholecalciferol (VITAMIN D) 2000 UNITS CAPS Take 1 capsule by mouth daily.    . fluticasone (FLONASE) 50 MCG/ACT nasal spray Place 2 sprays into both nostrils daily.  . Fluticasone-Salmeterol (ADVAIR DISKUS) 250-50 MCG/DOSE AEPB Inhale 1 puff into the lungs every 12 (twelve) hours.  Marland Kitchen LORazepam (ATIVAN) 1 MG tablet Take 1 tablet (1 mg total) by mouth at bedtime as needed.  . montelukast (SINGULAIR) 10 MG tablet Take 1  tablet (10 mg total) by mouth at bedtime.    Allergies  Allergen Reactions  . Augmentin [Amoxicillin-Pot Clavulanate]     Severe diarrhea  . Sulfonamide Derivatives Hives and Rash    Current Medications, Allergies, Past Medical History, Past Surgical History, Family History, and Social History were reviewed in [employer] record.    Review of Systems         See HPI - all other systems neg except as noted... Presents 11/14 w/ cough, congestion, drainage, wheezing... The patient denies anorexia, fever, weight loss, weight gain, vision loss, decreased hearing, hoarseness, chest pain, syncope, dyspnea on exertion, peripheral edema, headaches, hemoptysis, abdominal pain, melena, hematochezia, severe indigestion/heartburn, hematuria, incontinence, muscle weakness, suspicious skin lesions, transient blindness, difficulty walking, depression, unusual weight change, abnormal bleeding, enlarged lymph nodes, and angioedema.    Objective:   Physical Exam    WD, petite, overweight, 71 y/o WF in NAD... GENERAL:  Alert & oriented; pleasant & cooperative... HEENT:  [place]/AT, EOM-wnl, PERRLA, EACs-clear, TMs-wnl, NOSE-clear drainage , THROAT-clear & wnl. NECK:  Supple w/ fairROM; no JVD; normal carotid impulses w/o bruits; no thyromegaly or nodules palpated; no lymphadenopathy. CHEST:  Decr BS bilat w/ exp wheezing, rhonchi, congestion, etc... HEART:  Regular Rhythm; without murmurs/ rubs/ or gallops detected... ABDOMEN:  Soft & nontender; normal bowel sounds; no organomegaly or masses palpated... EXT: without deformities, mild arthritic changes; no varicose veins/ venous insuffic/ or edema. NEURO:   no focal deficits noted.   RADIOLOGY DATA:  Reviewed in the EPIC EMR & discussed w/ the patient...  LABORATORY DATA:  Reviewed in the EPIC EMR & discussed w/ the patient...   Assessment & Plan:    CPX>  Stable- CXR, & Fasting blood work all looks good...  COPD/ AB>   Ex-smoker, still uses the gum!  CXR is clear;  11/14>   presented 11/14 w/ AB exac & we will treat w/ depo/Pred, Levaquin, Mucinex, Hycodan...  Hx AdenoCa Lung> s/p RLLobectomy 2000>  No known recurrence & f/u CXR is ok...  CHOL>  on Lip10 + diet efforts; needs better diet or incr dose w/ LDL at 135...  Hx Cervical Cancer> s/p Hyst & unilat oopherectomy 1985>  Followed by GYN...  Osteoporosis> BMDs done at  [place] [facility]= -2.4; she has refused Bisphos rx due to "stomach burning" & rec to try Binosto70/wk (try samples)... 11/16>   BMD at [facility] w/ lowest Tscore-3.3 in left [facility] => start ALENDRONATE 70mg /wk + calcium, MVI, VitD; offered endocrine second opinion...  Anxiety>  She uses Ativan as needed...     Medication List        Accurate as of 05/14/16 11:59 PM. Always use your most recent med list.          alendronate 70 MG tablet Commonly known as:  FOSAMAX Take 1 tablet (70 mg total) by mouth once a week. Take with a full glass of water on an empty stomach.   aspirin 81 MG tablet   atorvastatin 10 MG tablet Commonly known as:  LIPITOR Take 1 tablet (10 mg total) by mouth daily.   fluticasone 50 MCG/ACT nasal spray Commonly known as:  FLONASE Place 2 sprays into both nostrils daily.   Fluticasone-Salmeterol 250-50 MCG/DOSE Aepb Commonly known as:  ADVAIR DISKUS Inhale 1 puff into the lungs every 12 (twelve) hours.   LORazepam 1 MG tablet Commonly known as:  ATIVAN Take 1 tablet (1 mg total) by mouth at bedtime as needed.   montelukast 10 MG tablet Commonly known as:  SINGULAIR Take 1 tablet (10 mg total) by mouth at bedtime.   OSCAL 500/200 D-3 500-200 MG-UNIT tablet Generic drug:  calcium-vitamin D   vitamin C 500 MG tablet Commonly known as:  ASCORBIC ACID   Vitamin D 2000 units Caps       Where to Get Your Medications    You can get these medications from any pharmacy   Bring a paper prescription for each of these medications  alendronate  70 MG tablet  atorvastatin 10 MG tablet  fluticasone 50 MCG/ACT nasal spray  Fluticasone-Salmeterol 250-50 MCG/DOSE Aepb  LORazepam 1 MG tablet  montelukast 10 MG tablet

## 2017-05-14 ENCOUNTER — Ambulatory Visit: Payer: Medicare Other | Admitting: Pulmonary Disease

## 2017-05-21 ENCOUNTER — Ambulatory Visit (INDEPENDENT_AMBULATORY_CARE_PROVIDER_SITE_OTHER)
Admission: RE | Admit: 2017-05-21 | Discharge: 2017-05-21 | Disposition: A | Discharge status: Home / Self Care | Payer: Medicare Other | Source: Ambulatory Visit | Attending: Pulmonary Disease | Admitting: Pulmonary Disease

## 2017-05-21 ENCOUNTER — Encounter: Payer: Self-pay | Admitting: Pulmonary Disease

## 2017-05-21 ENCOUNTER — Ambulatory Visit: Payer: Medicare Other | Admitting: Pulmonary Disease

## 2017-05-21 VITALS — BP 118/86 | HR 61 | Temp 98.1°F | Ht <= 58 in | Wt 138.6 lb

## 2017-05-21 DIAGNOSIS — Z0000 Encounter for general adult medical examination without abnormal findings: Secondary | ICD-10-CM | POA: Diagnosis not present

## 2017-05-21 DIAGNOSIS — J449 Chronic obstructive pulmonary disease, unspecified: Secondary | ICD-10-CM | POA: Diagnosis not present

## 2017-05-21 DIAGNOSIS — Z85118 Personal history of other malignant neoplasm of bronchus and lung: Secondary | ICD-10-CM

## 2017-05-21 DIAGNOSIS — J4489 Other specified chronic obstructive pulmonary disease: Secondary | ICD-10-CM

## 2017-05-21 DIAGNOSIS — J309 Allergic rhinitis, unspecified: Secondary | ICD-10-CM

## 2017-05-21 MED ORDER — FLUTICASONE-SALMETEROL 113-14 MCG/ACT IN AEPB: 2.0000 | INHALATION_SPRAY | Freq: Two times a day (BID) | RESPIRATORY_TRACT | 1 refills | Status: DC

## 2017-05-21 MED ORDER — PREDNISONE 5 MG (21) PO TBPK: ORAL_TABLET | Freq: Every day | ORAL | 0 refills | Status: DC

## 2017-05-21 NOTE — Patient Instructions (Signed)
Today we updated your med list in our EPIC system...    Continue your current medications the same...  We decided to change your ADVAIR to AIRDUO 113-14 >> inhale 2 sprays twice daily (then brush & rinse)...  Today we did your follow up CXR...    We will contact you w/ the results when available...   For the acute symptoms we decided to treat you w/ a Prednisone dosepak - take as directed...  Call for any questions...  Let's plan a follow up visit in 57yr, sooner if needed for problems.Marland KitchenMarland Kitchen

## 2017-05-21 NOTE — Progress Notes (Signed)
Subjective:    Patient ID: Pamela Terry, female    DOB: [birth year], 71 y.o.   MRN: [id number]  HPI 71 y/o WF here for a follow up visit and CPX... she has multiple medical problems including COPD, hx adenocarcinoma RLL w/ lobectomy 2000 & no known recurrence, Hyperlipidemia, hx ca cervix w/ hyst 1985, osteopenia, and anxiety...  ~  May 20, 2011:  Yearly ROV & CPX> she has a new Great Dane puppy= Pamela Terry (34mo old now); she had Shingles 5/12 on left buttock area, saw TP & Rx w/ Valtrex/ Pred; mild intertrig rash Rx w/ Lotrisone cream prn;  Breathing is stable on Advair, Singular, and CXR remains stable;  Chol looks good on Lip10 & wt down 7# even though she is not following diet, she says;  OK TDAP today & Rx for Shingles vaccine;  See prob list below>>    She saw Pamela Terry 8/12 for f/u colonoscopy> +Divertics and three 36mm polyps removed w/ f/u colon planned in 28yrs...    She saw Pamela Terry 10/12 for f/u microscoptic hematuria, stress incont, & adrenal adenoma> stable, symptoms mild, no further work up needed, f/u 23yr...  ~  May 18, 2012:  Yearly Pamela Terry reports that she is doing well- no new complaints or concerns; We reviewed the following medical problems during today's office visit >>     AR> on Allegra180, Flonase; she denies allergy symptoms at this time...    COPD, AB> on Advair250, Singulair10; she denies cough, sput, hemoptysis, SOB, etc...    Hx of Lung Cancer> ex-smoker quit 1997; RLL nodule found 2000 & she had RLLobectomy by Pamela Terry for poorly diff AdenoCa; oncology eval by Pamela Terry & no other therapy rec; no known recurrence to date...    Chol> on ASA81, Atorva10; FLP shows TChol 154, TG 117, HDL 47, LDL 84    Divertics, Polyps> last colonoscopy 8/12 by Pamela Terry showed divertics & several 64mm polyps, we don't have path, f/u due 5 yrs...    Hx Cancer of the Cervix> remote hx of Ca Cx w/ hyst & 1 ovary in 1985 by Pamela Terry; now followed by Pamela Terry...    Hx  microhematuria> she had f/u Pamela Terry 10/12- right adrenal adenoma, stress incont, microheme, hx Ca Cx; stable, no changes, f/u 63yr...    Osteopenia> on Calcium, VitD2000; she gets BMDs at Pamela Terry- last 11/13 w/ TScore -2.4 in left [facility]; she refuses Bisphos therapy...    Anxiety> on Ativan 1mg  prn and this works well for her... We reviewed prob list, meds, xrays and labs> see below for updates >> she had the 2013 Flu shot 10/13 & due for the PNEUMOVAX now- age 28; also received the Shingles vaccine 1/13 at Pamela Terry...  CXR 11/13 showed normal heart size, COPD/E, NAD...  LABS 11/13:  FLP- at goals on Lip10;  Chems- wnl;  CBC- wnl;  TSH=0.49;  VitD=44   ~  April 27, 2013:  Yearly ROV & Pamela Terry has acute symptoms of cough, wheezing/ chest congestion, yellow sput, sinus draining/ pressure, & low grade fever; she feels crummy but denies HA or SOB; Exam reveals prominent wheezing & rhonchi bilat;  We discussed Rx w/ Depo80, Pred-3dtaper, Levaquin (Pamela Terry, Pamela Terry), Mucinex, Hycodan...  We reviewed the following medical problems during today's office visit >>     AR> on Allegra180, Flonase; she denies active allergy symptoms at this time...    COPD, AB> on Advair250, Singulair10 regularly; presents 11/14 w/ URI & acute exac as above=> rx w/ Depo/Pred, Levaquin, Mucinex,  Hycodan...    Hx of Lung Cancer> ex-smoker quit 1997; RLL nodule found 2000 & she had RLLobectomy by Pamela Terry for poorly diff AdenoCa; oncology eval by Pamela Terry & no other therapy rec; CXR 11/14 looks good & no known recurrence to date...    Chol> on ASA81, Atorva10; FLP shows TChol 152, TG 76, HDL 41, LDL 96 therefore continue same...    Divertics, Polyps> last colonoscopy 8/12 by Pamela Terry showed divertics & several 73mm polyps, we don't have path, he recommended f/u in 5 yrs...    Hx Cancer of the Cervix> remote hx of Ca Cx w/ hyst & 1 ovary in 1985 by Pamela Terry; now followed by Pamela Terry but she reports overdue & asked to sched  f/u...    Hx microhematuria> she had f/u Pamela Terry 10/12- right adrenal adenoma, stress incont, microheme, hx Ca Cx; stable, no changes; Pamela Terry says "he released me"...    Osteopenia> on Calcium, VitD2000; she gets BMDs at Pamela Terry- last 11/13 w/ TScore -2.4 in left [facility]; she refuses Bisphos therapy because "it made my stomach burn"; rec trial Pamela Terry 70mg /wk (samples given).    Anxiety> on Ativan 1mg  prn and this works well for her... We reviewed prob list, meds, xrays and labs> see below for updates >> she had the 2014 Flu shot 10/14...  CXR 11/14 showed norm heart size, clear lungs w/ min scarring at bases, no lesions seen, NAD...  LABS 11/14:  FLP- at goals on atorva10;  Chems- wnl;  CBC- wnl;  TSH=0/17, not on meds, r/o hyperthy => repeat w/ FreeT3 & FreeT4...  ADDENDUM>>  Repeat TFTs 11/14 showed TSH=0.11 but FreeT3=2.6 (2.3-4.2) and FreeT4=1.01 (0.60-1.60)... We will refer to Pamela Terry for consult...  ~  April 27, 2014:  Yearly Pamela Terry reports "everything is good"; ROS notes some vague intermittent fleeting & self-limited SSCP, she is not concerned & declines any cardiac eval at this time; We reviewed the following medical problems during today's office visit >>     AR> on Allegra180, Flonase; she denies active allergy symptoms at this time...    COPD, AB> on Advair250, Singulair10 regularly; seen 11/14 w/ URI & acute exac as above=> rx w/ Depo/Pred, Levaquin, Mucinex, Hycodan & resolved; no prob over the last yr & not using the Advair regularly (ave 1-2 per week)...    Hx of Lung Cancer> ex-smoker quit 1997; RLL nodule found 2000 & she had RLLobectomy by Pamela Terry for poorly diff AdenoCa; oncology eval by Pamela Terry & no other therapy rec; CXR 11/15 looks good & no known recurrence to date...    Chol> on ASA81, Atorva10; FLP 11/15 shows TChol 202, TG 118, HDL 43, LDL 135... Not as good- contin same med, better diet or will need to incr dose.    Thyroid> she has subclinical  hyperthyroidism w/ suppressed TSH & normal FreeT3 & FreeT4; eval by DrGherghe- stable labs and she follows up every 1mo...    Divertics, Polyps> last colonoscopy 8/12 by [facility] showed divertics & several 43mm polyps, we don't have path, he recommended f/u in 5 yrs...    Hx Cancer of the Cervix> remote hx of Ca Cx w/ hyst & 1ovary removed in 1985 by Pamela Terry; now followed by Pamela Terry but she reports overdue & asked to sched f/u...    Hx microhematuria> she had f/u Pamela Terry 1/14- right adrenal adenoma, stress incont, microheme, hx Ca Cx; stable & he notes neg CT, cysto, NMP22 in 2/11; no changes & Pamela Terry says "he released me"...    Osteopenia>  on Calcium, VitD2000; she gets BMDs at [facility]- last 11/13 w/ TScore -2.4 in left [facility]; she refused Bisphos therapy because "it made my stomach burn"; she tried Pamela Terry 70mg  in 2014 and tol well but she never filled Rx- prescription re-written & encouraged to take it every week...    Anxiety> on Ativan 1mg  prn and this works well for her... We reviewed prob list, meds, xrays and labs> see below for updates >> she had the 2015 Flu vaccine 10/15...  CXR 11/15 showed norm heart size, calcif in Ao, clear lungs/ NAD, DJD in spine...   EKG 11/15 showed NSR, rate68, min NSSTTWA, NAD...   LABS 11/15:  FLP- not at goals w/ TChol=202 LDL=135;  Chems- wnl;  CBC- wnl;  TSH=0.90;  UA- no change +bl, no rbc, no infection...  ~  April 26, 2015:  1 year ROV & medical recheck... Pamela Terry reports a good yr- no new complaints or concerns; she's had some plantar fasciitis & treated self w/ Finn comfort shoes & this is helping she says; she has seen [employer] in the past for PT exercises... we reviewed the following medical problems during today's office visit >>     AR> on Zyrtek10, Flonase; she denies active allergy symptoms at this time...    COPD, AB> on Advair250, Singulair10 regularly; prev required Depo/Pred, Levaquin, Mucinex, Hycodan for bronchitic  exac; no prob over the last yr & not using the Advair regularly (ave 1-2 per week)...    Hx of Lung Cancer> ex-smoker quit 1997; RLL nodule found 2000 & she had RLLobectomy by Pamela Terry for poorly diff AdenoCa; oncology eval by Pamela Terry & no other therapy rec; f/u CXRs look good & no known recurrence...     Chol> on ASA81, Atorva10; FLP 11/16 shows TChol 134, TG 88, HDL 51, LDL 66... contin same med/ diet/ exercise...    Thyroid> she has subclinical hyperthyroidism w/ suppressed TSH & normal FreeT3 & FreeT4; eval by DrGherghe- stable labs and she follows up every 32mo...    Divertics, Polyps> last colonoscopy 8/12 by [facility] showed divertics & several 3mm polyps, we don't have path, he recommended f/u in 5 yrs...    Hx Cancer of the Cervix> remote hx of Ca Cx w/ hyst & 1ovary removed in 1985 by Pamela Terry; now followed by Pamela Terry but she reports overdue & asked to sched f/u...    Hx microhematuria> she had f/u Pamela Terry 1/14- right adrenal adenoma, stress incont, microheme, hx Ca Cx; stable & he notes neg CT, cysto, NMP22 in 2/11; no changes & Pamela Terry says "he released me"...    Osteoporosis> on Calcium, VitD2000; she gets BMDs at [facility]- last 10/16 w/ TScore -3.3 in left [facility]; she prev refused Bisphos therapy because "it made my stomach burn"; she tried Pamela Terry 70mg  in 2014 and tol well but she never filled Rx due to $$- given ALENDRONATE70/wk & encouraged compliance, offered Pamela Terry eval.    Anxiety> on Ativan 1mg  prn and this works well for her... EXAM shows Afeb, VSS, O2sat=98% on RA;  HEENT- neg, mallampati2;  Chest- clear w/o w/r/r;  Heart- RR w/o m/r/g;  Abd- soft, nontender, neg;  Ext- neg w/o c/c/e;  Neuro- intact w/o focal deficits...   BMD 04/12/15 at [facility]>  Lowest Tscore= -3.3 in left FemNeck  CXR 04/26/15>  Heart size looks wnl, clear lungs w/ min basialr scarring; degen changes and osteopenia in spine  LABS 04/26/15>  FLP- at goals on Atorva10;  Chems- wnl;  CBC- wnl;   TSH=0.58;  VitD=41... IMP/PLAN>>  Pamela Terry has signif osteoporosis now & needs bone building therapy- ALENDRONATE 70mg /wk- discussed taking 1st thing in AM same day Qwk don't eat or drink for 46yr etc... Continue calcium, MVI, VitD supplements; she's already had the 2016 FLU vaccine...   ~  May 14, 2016:  Yearly ROV & Pamela Terry is seeing DrJohn for PCP- here for pulm med follow up visit> states that her breathing is good & denies cough, sput, hemoptysis, SOB, CP, palpit, edema;  She exercises by walking but needs an inclement weather indoor plan;  She tells me she is going to retire at 25...  She had the FLU vaccine Oct2017 at work [employer]), she needs the PREVNAR-13 today...  We reviewed the following pulmonary related issues >>     AR> on Zyrtek10, Flonase; she denies active allergy symptoms at this time...    COPD, AB> Hx mod airflow obstruction w/ FEV!=1.27 (70%) in 2008; on Advair250, Singulair10 regularly; prev required Depo/Pred, Levaquin, Mucinex, Hycodan for bronchitic exac; no prob over the last yr but not using the Advair regularly (ave 1-2 per week)...    Hx of Lung Cancer> ex-smoker quit 1997; RLL nodule found 2000 & she had RLLobectomy by Pamela Terry for poorly diff AdenoCa; oncology eval by Pamela Terry & no other therapy rec; f/u CXRs look good & no known recurrence...  EXAM shows Afeb, VSS, O2sat=97% on RA;  HEENT- neg, mallampati2;  Chest- clear w/o w/r/r;  Heart- RR w/o m/r/g;  Abd- soft, nontender, neg;  Ext- neg w/o c/c/e;  Neuro- intact w/o focal deficits.  CXR 05/14/16>  Norm heart size, atherosclerotic changes in Ao, COPD w/ left apical pleuroparenchymal scarring and mild basilar pleuroparenchymal thickening- NAD///   LABS 05/14/16>  FLP- at goals on Atorva10;  Chems- wnl;  CBC- wnl;  TSH=0.55;  VitD=49... IMP/PLAN>>  Pamela Terry remains stable w/ hx GOLD Stage 2 COPD & prev RLLobectomy for adenoCa in 2000; no known recurrence & her breathing is stable; we discussed incr exercise and continued  meds...   ~  May 21, 2017:  60yr ROV & Pamela Terry is c/o some post nasal drip, mild cough w/ clear sput, Advair is too expensive & we will switch to Generic AirDuo... She denies chenage in her chr stable SOB/DOE, exercise is by walking, she deines CP/ palpit/ dizzy/ edema... We reviewed the following pulmonary related issues>      AR> on Zyrtek10, Flonase; she denies active allergy symptoms at this time other than the post nasal drip...    COPD, AB> Hx mod airflow obstruction w/ FEV!=1.27 (70%) in 2008; on Advair250, Singulair10 regularly; prev required Depo/Pred, Levaquin, Mucinex, Hycodan for bronchitic exac; no prob over the last yr but not using the Advair regularly & c/o the cost=> rec switch to generic AIRDUO 113-14 2sp Bid...    Hx of Lung Cancer> ex-smoker quit 1997; RLL nodule found 2000 & she had RLLobectomy by Pamela Terry for poorly diff AdenoCa; oncology eval by Pamela Terry & no other therapy rec; f/u CXRs look good & no known recurrence...  EXAM shows Afeb, VSS, O2sat=97% on RA;  HEENT- neg, mallampati2;  Chest- clear w/o w/r/r;  Heart- RR w/o m/r/g;  Abd- soft, nontender, neg;  Ext- neg w/o c/c/e;  Neuro- intact w/o focal deficits.  CXR 05/21/17 (independently reviewed by me in the PACS system) showed norm heart size, atherosclerotic changes in aorta, underlying COPD w/ bilat scarring and NAD... IMP/PLAN>>  Pamela Terry is stable w/o recent resp exac; we reviewed need for diet/ exercise & take  meds regularly- as above we switched the Advair to [facility] generic... REC to continue yearly rov w/ CXR.          Problem List:    ALLERGIC RHINITIS (ICD-477.9) - on ALLEGRA 180mg /d,  FLONASE 2spQhs...  COPD (ICD-496) &  ASTHMATIC BRONCHITIS, ACUTE (ICD-466.0) - on ADVAIR 250Bid,  SINGULAIR 10mg /d... states doing well, breathing well... denies cough, sputum, hemoptysis, worsening dyspnea,  wheezing, chest pains, snoring, daytime hypersomnolence, etc...  ~  baseline CXR w/ pleural thickening on right (prev  surg), NAD... ~  PFT 10/08 showed FVC= 2.69 (108%), FEV1= 1.27 (70%), %1sec= 47, mid-flows= 17%pred. ~  CXR 11/09 & 11/10 showed COPD, NAD. ~  CXR 11/11 showed COPD w/ bullous dis, scar right base, NAD.Marland Kitchen. ~  CXR 11/12 showed min scarring RLL, chr changes, NAD.Marland Kitchen. ~  CXR 11/13 showed normal heart size, COPD/E, NAD... ~  11/14: presented w/ URI & AB exac> Rx w/ Depo80, Pred-3dtaper, Levaquin (Pamela Terry, Pamela Terry), Mucinex, Hycodan... ~  CXR 11/14 showed norm heart size, clear lungs w/ min scarring at bases, no lesions seen, NAD... ~  11/15: she reports doing well, no resp exac, only using the Advair once or twice per week... ~  CXR 11/15 showed norm heart size, calcif in Ao, clear lungs/ NAD, DJD in spine... ~  11/16: she continues to do well on Advair, Singulair; CXR remains clear, NAD...   Hx of ADENOCARCINOMA, LUNG (ICD-162.9) - former very heavy smoker (up to 2ppd) and quit in 1997... routine CXR in 2000 showed a 1.5cm RLL nodule... eval revealed a non-small cell cancer-  s/p RLLobectomy 6/00 by Pamela Terry for poorly diff AdenoCa... Oncology eval by Pamela Terry w/ no other therapy recommended... no known recurrence w/ yearly f/u since 2000. ~  Follow up CXRs as above...  HYPERCHOLESTEROLEMIA (ICD-272.0) - on LIPITOR 10mg /d... ~  [place] 10/08 on Lip10 showed TChol 168,TG 105, HDL 49, LDL 98... rec- same med + better diet! ~  FLP 11/09 on Lip10 showed TChol 143, TG 120, HDL 37, LDL 83 ~  FLP 11/10 on Lip10 showed TChol 156, TG 118, HDL 41, LDL 91 ~  FLP 11/11 on Lip10 showed TChol 150, TG 116, HDL 41, LDL 86 ~  FLP 11/12 on Lip10 showed TChol 137, TG 78, HDL 50, LDL 71 ~  FLP 11/13 on Lip10 showed TChol 154, TG 117, HDL 47, LDL 84  ~  FLP 11/14 on Lip10 showed TChol 152, TG 76, HDL 41, LDL 96  ~  FLP 11/15 on Lip10 showed TChol 202, TG 118, HDL 43, LDL 135... Needs better diet or incr dose! ~  FLP 11/16 on Lip10 showed TChol 134, TG 88, HDL 51, LDL 66  LOW TSH >>  ~  Labs 11/14 showed TSH= 0.17 & she  is clinically euthyroid;  Repeat studies showed TSH=0.11 but FreeT3=2.6 (2.3-4.2) and FreeT4=1.01 (0.60-1.60)... We will refer to Pamela Terry for consult... ~  Now followed by DrGherghe for Pamela Terry/ Thyroid...  DIVERTICULOSIS OF COLON (ICD-562.10) COLONIC POLYPS (ICD-211.3) - tubular adenoma removed in 1997... colonoscopy 6/07 by [facility] showing diminutive polyp and divertics... f/u planned 73yrs. ~  Repeat colonoscopy 8/12 by [facility] showed divertics, three 27mm polyps removed, f/u planned 35yrs...  Hx of CERVICAL CANCER (ICD-180.9) & MICROSCOPIC HEMATURIA (ICD-599.72) - s/p hysterectomy & unilateral oopherectomy for Ca in situ of cervix 1985 by Pamela Terry... she is now followed by Pamela Terry & had some microscopic hematuria noted> referred to Urology w/ eval Pamela Terry (neg CT cysto, NMP22) and he continues yearlt ROV.Marland KitchenMarland Kitchen  OSTEOPOROSIS (ICD-733.00) - prev on Actonel but she stopped this on her own & doesn't want to restart bisphos therapy... takes Calcium supplements & Vitamins... ~  BMD at [facility] 9/06 showed TScores -1.9 in Spine,  -1.8 in left THip,  -2.1 in left FemNeck. ~  labs 11/09 showed Vit D level = 32... rec to start OTC Vit D supplement ~1000u daily. ~  labs 11/10 showed Vit D level = 35... continue daily supplementation. ~  BMD 11/13 at [facility] showed TScore -2.4 in left [facility] & she declines Bisphos therapy, just wants to continue Calcium, MVI, VitD... ~  11/14: on Calcium, VitD2000; she refuses Bisphos therapy because "it made my stomach burn"; rec trial Pamela Terry 70mg /wk (samples given). ~  11/15: she reports that she tolerated the Pamela Terry well but never filled the Rx; encouraged to take it every week along w/ her calcium, MVI, VitD... ~  11/16: BMD at [facility] 03/2015 showed lowest Tscore=-3.3 in left [facility] => rec to start 7 stay on ALENDRONATE 70mg  Qwk + calcium, MVI, VitD...  ANXIETY (ICD-300.00) - takes ATIVAN 1mg  Qhs...  Health Maintenance: she takes ASA 81mg /d... ~   GI:  followed by [facility] w/ last colon 6/12 w/ several polyps removed; we don't have path reports but she was told f/u 5 yrs... ~  GYN:  followed by Pamela Terry for PAP etc (she sent her to Urology for hematuria)... goes to [facility] for Mammograms & BMDs... ~  Immunizations:  she had a PNEUMOVAX in 2003 & 11/13 at age66... gets yearly FLU shots... She had Shingles vax 1/13 at [facility]...   Past Surgical History:  Procedure Laterality Date  . ABDOMINAL HYSTERECTOMY     in her 30's  . APPENDECTOMY    . TOTAL ABDOMINAL HYSTERECTOMY W/ BILATERAL SALPINGOOPHORECTOMY  1985   Dr Ree Edman for Ca of cervix    Outpatient Encounter Medications as of 05/21/2017  Medication Sig  . alendronate (FOSAMAX) 70 MG tablet Take 1 tablet (70 mg total) once a week by mouth. Take with a full glass of water on an empty stomach.  Marland Kitchen aspirin 81 MG tablet Take 81 mg by mouth daily.    Marland Kitchen atorvastatin (LIPITOR) 10 MG tablet Take 1 tablet (10 mg total) daily by mouth.  . calcium-vitamin D (OSCAL 500/200 D-3) 500-200 MG-UNIT per tablet Take 1 tablet by mouth daily.    . Cholecalciferol (VITAMIN D) 2000 UNITS CAPS Take 1 capsule by mouth daily.    . fluticasone (FLONASE) 50 MCG/ACT nasal spray Place 2 sprays daily into both nostrils.  Marland Kitchen LORazepam (ATIVAN) 1 MG tablet Take 1 tablet (1 mg total) at bedtime as needed by mouth.  . montelukast (SINGULAIR) 10 MG tablet Take 1 tablet (10 mg total) at bedtime by mouth.  .  Fluticasone-Salmeterol (ADVAIR DISKUS) 250-50 MCG/DOSE AEPB Inhale 1 puff into the lungs every 12 (twelve) hours.    Allergies  Allergen Reactions  . Augmentin [Amoxicillin-Pot Clavulanate]     Severe diarrhea  . Sulfonamide Derivatives Hives and Rash    Current Medications, Allergies, Past Medical History, Past Surgical History, Family History, and Social History were reviewed in [employer] record.    Review of Systems         See HPI - all other systems neg except as  noted... Presents 11/14 w/ cough, congestion, drainage, wheezing... The patient denies anorexia, fever, weight loss, weight gain, vision loss, decreased hearing, hoarseness, chest pain, syncope, dyspnea on exertion, peripheral edema, headaches, hemoptysis, abdominal pain, melena, hematochezia,  severe indigestion/heartburn, hematuria, incontinence, muscle weakness, suspicious skin lesions, transient blindness, difficulty walking, depression, unusual weight change, abnormal bleeding, enlarged lymph nodes, and angioedema.    Objective:   Physical Exam    WD, petite, overweight, 71 y/o WF in NAD... GENERAL:  Alert & oriented; pleasant & cooperative... HEENT:  [place]/AT, EOM-wnl, PERRLA, EACs-clear, TMs-wnl, NOSE-clear drainage , THROAT-clear & wnl. NECK:  Supple w/ fairROM; no JVD; normal carotid impulses w/o bruits; no thyromegaly or nodules palpated; no lymphadenopathy. CHEST:  Decr BS bilat w/ exp wheezing, rhonchi, congestion, etc... HEART:  Regular Rhythm; without murmurs/ rubs/ or gallops detected... ABDOMEN:  Soft & nontender; normal bowel sounds; no organomegaly or masses palpated... EXT: without deformities, mild arthritic changes; no varicose veins/ venous insuffic/ or edema. NEURO:   no focal deficits noted.   RADIOLOGY DATA:  Reviewed in the EPIC EMR & discussed w/ the patient...  LABORATORY DATA:  Reviewed in the EPIC EMR & discussed w/ the patient...   Assessment & Plan:    CPX>  Stable- CXR, & Fasting blood work all looks good... 05/21/17>   Jezabel is stable w/o recent resp exac; we reviewed need for diet/ exercise & take meds regularly- as above we switched the Advair to [facility] generic... REC to continue yearly rov w/ CXR  COPD/ AB>  Ex-smoker, still uses the gum!  CXR is clear;  11/14>   presented 11/14 w/ AB exac & we treated w/ depo/Pred, Levaquin, Mucinex, Hycodan...  Hx AdenoCa Lung> s/p RLLobectomy 2000>  No known recurrence & f/u CXR is ok...  CHOL>  on Lip10 + diet  efforts; needs better diet or incr dose w/ LDL at 135...  Hx Cervical Cancer> s/p Hyst & unilat oopherectomy 1985>  Followed by GYN...  Osteoporosis> BMDs done at [facility] & Tscaore= -2.4; she has refused Bisphos rx due to "stomach burning" & rec to try Binosto70/wk (try samples)... 11/16>   BMD at [facility] w/ lowest Tscore-3.3 in left [facility] => start ALENDRONATE 70mg /wk + calcium, MVI, VitD; offered Pamela Terry second opinion...  Anxiety>  She uses Ativan as needed...     Medication List        Accurate as of 05/21/17  3:58 PM. Always use your most recent med list.          alendronate 70 MG tablet Commonly known as:  FOSAMAX Take 1 tablet (70 mg total) once a week by mouth. Take with a full glass of water on an empty stomach.   aspirin 81 MG tablet   atorvastatin 10 MG tablet Commonly known as:  LIPITOR Take 1 tablet (10 mg total) daily by mouth.   fluticasone 50 MCG/ACT nasal spray Commonly known as:  FLONASE Place 2 sprays daily into both nostrils.   Fluticasone-Salmeterol 113-14 MCG/ACT Aepb Commonly known as:  AIRDUO RESPICLICK 458/09 Inhale 2 puffs into the lungs 2 (two) times daily.   LORazepam 1 MG tablet Commonly known as:  ATIVAN Take 1 tablet (1 mg total) at bedtime as needed by mouth.   montelukast 10 MG tablet Commonly known as:  SINGULAIR Take 1 tablet (10 mg total) at bedtime by mouth.   OSCAL 500/200 D-3 500-200 MG-UNIT tablet Generic drug:  calcium-vitamin D   predniSONE 5 MG (21) Tbpk tablet Commonly known as:  STERAPRED UNI-PAK 21 TAB Take by mouth daily. Please taper as prescribed   Vitamin D 2000 units Caps       Where to Get Your Medications    These medications were sent to [facility]  [address]  [place], [place] [address]   Phone:  [phone]   Fluticasone-Salmeterol 113-14 MCG/ACT Aepb  predniSONE 5 MG (21) Tbpk tablet

## 2017-05-23 ENCOUNTER — Telehealth: Payer: Self-pay | Admitting: Pulmonary Disease

## 2017-05-23 MED ORDER — FLUTICASONE-SALMETEROL 113-14 MCG/ACT IN AEPB: 2.0000 | INHALATION_SPRAY | Freq: Two times a day (BID) | RESPIRATORY_TRACT | 1 refills | Status: DC

## 2017-05-23 NOTE — Telephone Encounter (Signed)
Pt returning call. Pamela Terry that it might be better to call her cell number [phone]

## 2017-05-23 NOTE — Telephone Encounter (Signed)
Pt calling back about results .  Please call on wk number [phone]

## 2017-05-23 NOTE — Telephone Encounter (Signed)
Notes recorded by Noralee Space, MD on 05/23/2017 at 10:30 AM EST Please notify patient>  CXR shows norm heart size, atherosclerotic changes in Aom, underlying COPD w/ bilat scarring- stable & NAD...  ATC pt, no answer. Left message for pt to call back.

## 2017-05-23 NOTE — Telephone Encounter (Signed)
Per SN, he advises for pt that the next time around, never ever start smoking (quit smoking 1997).  Advised pt to continue taking aspirin, keep your blood pressure and blood sugar and cholesterol as tightly controlled as possible.  SN stated that pt is doing all she can do.  Pt expressed understanding. Nothing further needed.

## 2017-05-23 NOTE — Telephone Encounter (Signed)
ATC pt, no answer. Left message for pt to call back.  

## 2017-05-23 NOTE — Telephone Encounter (Signed)
Spoke with pt, advised results. Pt understood but wanted to see if there was anything she should do about the atherosclerotic changes in the transverse aorta? I advised her that possibly Dr. Jenny Reichmann should see this report. SN please advise.

## 2017-08-26 ENCOUNTER — Encounter: Payer: Self-pay | Admitting: Internal Medicine

## 2017-08-26 DIAGNOSIS — Z1231 Encounter for screening mammogram for malignant neoplasm of breast: Secondary | ICD-10-CM | POA: Diagnosis not present

## 2017-08-26 DIAGNOSIS — M8589 Other specified disorders of bone density and structure, multiple sites: Secondary | ICD-10-CM | POA: Diagnosis not present

## 2017-08-29 DIAGNOSIS — N6012 Diffuse cystic mastopathy of left breast: Secondary | ICD-10-CM | POA: Diagnosis not present

## 2017-08-29 DIAGNOSIS — N6323 Unspecified lump in the left breast, lower outer quadrant: Secondary | ICD-10-CM | POA: Diagnosis not present

## 2017-09-11 ENCOUNTER — Encounter: Payer: Self-pay | Admitting: Internal Medicine

## 2017-10-20 DIAGNOSIS — M25562 Pain in left knee: Secondary | ICD-10-CM | POA: Insufficient documentation

## 2017-10-20 DIAGNOSIS — M7052 Other bursitis of knee, left knee: Secondary | ICD-10-CM | POA: Insufficient documentation

## 2017-10-24 DIAGNOSIS — M25562 Pain in left knee: Secondary | ICD-10-CM | POA: Diagnosis not present

## 2017-11-05 ENCOUNTER — Other Ambulatory Visit: Payer: Self-pay | Admitting: Internal Medicine

## 2017-11-05 NOTE — Telephone Encounter (Signed)
Done erx 

## 2017-11-05 NOTE — Telephone Encounter (Signed)
08/07/2017 90# 

## 2018-01-06 DIAGNOSIS — M7052 Other bursitis of knee, left knee: Secondary | ICD-10-CM | POA: Diagnosis not present

## 2018-01-20 DIAGNOSIS — H26493 Other secondary cataract, bilateral: Secondary | ICD-10-CM | POA: Diagnosis not present

## 2018-01-20 DIAGNOSIS — H02831 Dermatochalasis of right upper eyelid: Secondary | ICD-10-CM | POA: Diagnosis not present

## 2018-01-20 DIAGNOSIS — H04123 Dry eye syndrome of bilateral lacrimal glands: Secondary | ICD-10-CM | POA: Diagnosis not present

## 2018-01-20 DIAGNOSIS — D3131 Benign neoplasm of right choroid: Secondary | ICD-10-CM | POA: Diagnosis not present

## 2018-01-20 DIAGNOSIS — H16223 Keratoconjunctivitis sicca, not specified as Sjogren's, bilateral: Secondary | ICD-10-CM | POA: Diagnosis not present

## 2018-05-11 ENCOUNTER — Other Ambulatory Visit (INDEPENDENT_AMBULATORY_CARE_PROVIDER_SITE_OTHER): Payer: Medicare Other

## 2018-05-11 ENCOUNTER — Encounter: Payer: Self-pay | Admitting: Internal Medicine

## 2018-05-11 ENCOUNTER — Ambulatory Visit (INDEPENDENT_AMBULATORY_CARE_PROVIDER_SITE_OTHER): Payer: Medicare Other | Admitting: Internal Medicine

## 2018-05-11 ENCOUNTER — Ambulatory Visit (INDEPENDENT_AMBULATORY_CARE_PROVIDER_SITE_OTHER)
Admission: RE | Admit: 2018-05-11 | Discharge: 2018-05-11 | Disposition: A | Discharge status: Home / Self Care | Payer: Medicare Other | Source: Ambulatory Visit | Attending: Internal Medicine | Admitting: Internal Medicine

## 2018-05-11 VITALS — BP 140/82 | HR 68 | Temp 98.0°F | Ht <= 58 in | Wt 130.0 lb

## 2018-05-11 DIAGNOSIS — Z0000 Encounter for general adult medical examination without abnormal findings: Secondary | ICD-10-CM

## 2018-05-11 DIAGNOSIS — R3129 Other microscopic hematuria: Secondary | ICD-10-CM | POA: Diagnosis not present

## 2018-05-11 DIAGNOSIS — C3490 Malignant neoplasm of unspecified part of unspecified bronchus or lung: Secondary | ICD-10-CM

## 2018-05-11 DIAGNOSIS — J449 Chronic obstructive pulmonary disease, unspecified: Secondary | ICD-10-CM | POA: Diagnosis not present

## 2018-05-11 LAB — BASIC METABOLIC PANEL
BUN: 14 mg/dL (ref 6–23)
CALCIUM: 10.1 mg/dL (ref 8.4–10.5)
CHLORIDE: 103 meq/L (ref 96–112)
CO2: 28 meq/L (ref 19–32)
CREATININE: 0.93 mg/dL (ref 0.40–1.20)
GFR: 63.12 mL/min (ref >60.00)
Glucose, Bld: 99 mg/dL (ref 70–99)
Potassium: 4 mEq/L (ref 3.5–5.1)
SODIUM: 141 meq/L (ref 135–145)

## 2018-05-11 LAB — CBC WITH DIFFERENTIAL/PLATELET
Basophils Absolute: 0 10*3/uL (ref 0.0–0.1)
Basophils Relative: 0.4 % (ref 0.0–3.0)
EOS PCT: 1.9 % (ref 0.0–5.0)
Eosinophils Absolute: 0.2 10*3/uL (ref 0.0–0.7)
HCT: 44.2 % (ref 36.0–46.0)
Hemoglobin: 14.7 g/dL (ref 12.0–15.0)
LYMPHS ABS: 3.1 10*3/uL (ref 0.7–4.0)
Lymphocytes Relative: 30.6 % (ref 12.0–46.0)
MCHC: 33.3 g/dL (ref 30.0–36.0)
MCV: 91.3 fl (ref 78.0–100.0)
MONOS PCT: 6.7 % (ref 3.0–12.0)
Monocytes Absolute: 0.7 10*3/uL (ref 0.1–1.0)
NEUTROS ABS: 6 10*3/uL (ref 1.4–7.7)
NEUTROS PCT: 60.4 % (ref 43.0–77.0)
Platelets: 334 10*3/uL (ref 150.0–400.0)
RBC: 4.84 Mil/uL (ref 3.87–5.11)
RDW: 13.1 % (ref 11.5–15.5)
WBC: 10 10*3/uL (ref 4.0–10.5)

## 2018-05-11 LAB — URINALYSIS, ROUTINE W REFLEX MICROSCOPIC
BILIRUBIN URINE: NEGATIVE
Ketones, ur: NEGATIVE
Leukocytes, UA: NEGATIVE
NITRITE: NEGATIVE
Specific Gravity, Urine: 1.015 (ref 1.000–1.030)
TOTAL PROTEIN, URINE-UPE24: NEGATIVE
URINE GLUCOSE: NEGATIVE
UROBILINOGEN UA: 0.2 (ref 0.0–1.0)
pH: 7 (ref 5.0–8.0)

## 2018-05-11 LAB — LIPID PANEL
CHOL/HDL RATIO: 3
Cholesterol: 159 mg/dL (ref 0–200)
HDL: 56.5 mg/dL (ref >39.00)
LDL CALC: 83 mg/dL (ref 0–99)
NONHDL: 102.1
Triglycerides: 97 mg/dL (ref 0.0–149.0)
VLDL: 19.4 mg/dL (ref 0.0–40.0)

## 2018-05-11 LAB — HEPATIC FUNCTION PANEL
ALBUMIN: 4.6 g/dL (ref 3.5–5.2)
ALT: 24 U/L (ref 0–35)
AST: 23 U/L (ref 0–37)
Alkaline Phosphatase: 58 U/L (ref 39–117)
Bilirubin, Direct: 0.1 mg/dL (ref 0.0–0.3)
TOTAL PROTEIN: 7.7 g/dL (ref 6.0–8.3)
Total Bilirubin: 0.8 mg/dL (ref 0.2–1.2)

## 2018-05-11 LAB — TSH: TSH: 0.95 u[IU]/mL (ref 0.35–4.50)

## 2018-05-11 MED ORDER — LORAZEPAM 1 MG PO TABS: 1.0000 mg | ORAL_TABLET | Freq: Every evening | ORAL | 1 refills | Status: DC | PRN

## 2018-05-11 MED ORDER — MONTELUKAST SODIUM 10 MG PO TABS: 10.0000 mg | ORAL_TABLET | Freq: Every day | ORAL | 3 refills | Status: DC

## 2018-05-11 MED ORDER — FLUTICASONE-SALMETEROL 113-14 MCG/ACT IN AEPB: 2.0000 | INHALATION_SPRAY | Freq: Two times a day (BID) | RESPIRATORY_TRACT | 3 refills | Status: DC

## 2018-05-11 MED ORDER — ATORVASTATIN CALCIUM 10 MG PO TABS: 10.0000 mg | ORAL_TABLET | Freq: Every day | ORAL | 3 refills | Status: DC

## 2018-05-11 MED ORDER — ALENDRONATE SODIUM 70 MG PO TABS: 70.0000 mg | ORAL_TABLET | ORAL | 3 refills | Status: DC

## 2018-05-11 MED ORDER — ZOSTER VAC RECOMB ADJUVANTED 50 MCG/0.5ML IM SUSR: 0.5000 mL | Freq: Once | INTRAMUSCULAR | 1 refills | Status: AC

## 2018-05-11 MED ORDER — FLUTICASONE PROPIONATE 50 MCG/ACT NA SUSP: 2.0000 | Freq: Every day | NASAL | 3 refills | Status: DC

## 2018-05-11 NOTE — Progress Notes (Signed)
Subjective:    Patient ID: Pamela Terry, female    DOB: [birth year], 72 y.o.   MRN: [id number]  HPI  Here for wellness and f/u;  Overall doing ok;  Pt denies Chest pain, worsening SOB, DOE, wheezing, orthopnea, PND, worsening LE edema, palpitations, dizziness or syncope.  Pt denies neurological change such as new headache, facial or extremity weakness.  Pt denies polydipsia, polyuria, or low sugar symptoms. Pt states overall good compliance with treatment and medications, good tolerability, and has been trying to follow appropriate diet.  Pt denies worsening depressive symptoms, suicidal ideation or panic. No fever, night sweats, wt loss, loss of appetite, or other constitutional symptoms.  Pt states good ability with ADL's, has low fall risk, home safety reviewed and adequate, no other significant changes in hearing or vision, and only occasionally active with exercise.  No other exam findings Past Medical History:  Diagnosis Date  . Acute asthmatic bronchitis   . Adenocarcinoma, lung ([place])   . Allergic rhinitis   . Anxiety   . Carcinoma in situ of endocrine gland   . Colon polyp   . COPD (chronic obstructive pulmonary disease) ([place])   . Diverticulosis of colon   . History of cervical cancer   . Hypercholesterolemia   . Microscopic hematuria   . Osteoporosis    Past Surgical History:  Procedure Laterality Date  . ABDOMINAL HYSTERECTOMY     in her 30's  . APPENDECTOMY    . TOTAL ABDOMINAL HYSTERECTOMY W/ BILATERAL SALPINGOOPHORECTOMY  1985   Dr Ree Edman for Ca of cervix    reports that she quit smoking about 32 years ago. Her smoking use included cigarettes. She has a 46.00 pack-year smoking history. She has never used smokeless tobacco. She reports that she does not drink alcohol or use drugs. family history includes Asthma in her maternal aunt; Breast cancer in her paternal grandmother; Diabetes in her brother; Heart disease in her father, maternal grandmother, and paternal  grandfather; Hyperlipidemia in her father; Prostate cancer in her maternal grandfather; Rheum arthritis in her mother; Stroke in her father. Allergies  Allergen Reactions  . Augmentin [Amoxicillin-Pot Clavulanate]     Severe diarrhea  . Sulfonamide Derivatives Hives and Rash   Current Outpatient Medications on File Prior to Visit  Medication Sig Dispense Refill  . aspirin 81 MG tablet Take 81 mg by mouth daily.      . calcium-vitamin D (OSCAL 500/200 D-3) 500-200 MG-UNIT per tablet Take 1 tablet by mouth daily.      . Cholecalciferol (VITAMIN D) 2000 UNITS CAPS Take 1 capsule by mouth daily.       No current facility-administered medications on file prior to visit.    Review of Systems Constitutional: Negative for other unusual diaphoresis, sweats, appetite or weight changes HENT: Negative for other worsening hearing loss, ear pain, facial swelling, mouth sores or neck stiffness.   Eyes: Negative for other worsening pain, redness or other visual disturbance.  Respiratory: Negative for other stridor or swelling Cardiovascular: Negative for other palpitations or other chest pain  Gastrointestinal: Negative for worsening diarrhea or loose stools, blood in stool, distention or other pain Genitourinary: Negative for hematuria, flank pain or other change in urine volume.  Musculoskeletal: Negative for myalgias or other joint swelling.  Skin: Negative for other color change, or other wound or worsening drainage.  Neurological: Negative for other syncope or numbness. Hematological: Negative for other adenopathy or swelling Psychiatric/Behavioral: Negative for hallucinations, other worsening agitation, SI, self-injury, or  new decreased concentration \\All  other system neg per pt    Objective:   Physical Exam BP 140/82   Pulse 68   Temp 98 F (36.7 C) (Oral)   Ht 4\' 10"  (1.473 m)   Wt 130 lb (59 kg)   SpO2 94%   BMI 27.17 kg/m  VS noted,  Constitutional: Pt is oriented to person,  place, and time. Appears well-developed and well-nourished, in no significant distress and comfortable Head: Normocephalic and atraumatic  Eyes: Conjunctivae and EOM are normal. Pupils are equal, round, and reactive to light Right Ear: External ear normal without discharge Left Ear: External ear normal without discharge Nose: Nose without discharge or deformity Mouth/Throat: Oropharynx is without other ulcerations and moist  Neck: Normal range of motion. Neck supple. No JVD present. No tracheal deviation present or significant neck LA or mass Cardiovascular: Normal rate, regular rhythm, normal heart sounds and intact distal pulses.   Pulmonary/Chest: WOB normal and breath sounds without rales or wheezing  Abdominal: Soft. Bowel sounds are normal. NT. No HSM  Musculoskeletal: Normal range of motion. Exhibits no edema Lymphadenopathy: Has no other cervical adenopathy.  Neurological: Pt is alert and oriented to person, place, and time. Pt has normal reflexes. No cranial nerve deficit. Motor grossly intact, Gait intact Skin: Skin is warm and dry. No rash noted or new ulcerations Psychiatric:  Has normal mood and affect. Behavior is normal without agitation No other exam findings Lab Results  Component Value Date   WBC 10.0 05/11/2018   HGB 14.7 05/11/2018   HCT 44.2 05/11/2018   PLT 334.0 05/11/2018   GLUCOSE 99 05/11/2018   CHOL 159 05/11/2018   TRIG 97.0 05/11/2018   HDL 56.50 05/11/2018   LDLCALC 83 05/11/2018   ALT 24 05/11/2018   AST 23 05/11/2018   NA 141 05/11/2018   K 4.0 05/11/2018   CL 103 05/11/2018   CREATININE 0.93 05/11/2018   BUN 14 05/11/2018   CO2 28 05/11/2018   TSH 0.95 05/11/2018        Assessment & Plan:

## 2018-05-11 NOTE — Assessment & Plan Note (Signed)
For cxr f/u yearly

## 2018-05-11 NOTE — Assessment & Plan Note (Signed)

## 2018-05-11 NOTE — Patient Instructions (Signed)
Your Shingles shot prescription was sent to your pharmacy, so you should stop by and at least get on the wait list if available  Please continue all other medications as before, and refills have been done if requested.  Please have the pharmacy call with any other refills you may need.  Please continue your efforts at being more active, low cholesterol diet, and weight control.  You are otherwise up to date with prevention measures today.  Please keep your appointments with your specialists as you may have planned  Please go to the XRAY Department in the Basement (go straight as you get off the elevator) for the x-ray testing  Please go to the LAB in the Basement (turn left off the elevator) for the tests to be done today  You will be contacted by phone if any changes need to be made immediately.  Otherwise, you will receive a letter about your results with an explanation, but please check with MyChart first.  Please remember to sign up for MyChart if you have not done so, as this will be important to you in the future with finding out test results, communicating by private email, and scheduling acute appointments online when needed.  Please return in 1 year for your yearly visit, or sooner if needed, with Lab testing done 3-5 days before

## 2018-05-11 NOTE — Assessment & Plan Note (Signed)
Has long hx benign per urology evaluation 2011

## 2018-05-15 ENCOUNTER — Ambulatory Visit: Payer: Self-pay

## 2018-05-15 NOTE — Telephone Encounter (Signed)
I did leave message on mychart for labs, but all were essentially normal  The bone density is c/w osteoporosis, and she should continue the fosamax

## 2018-05-15 NOTE — Telephone Encounter (Signed)
Pt has been informed of results and expressed understanding.  °

## 2018-05-15 NOTE — Telephone Encounter (Signed)
Patient is calling for lab/imaging results, unable to give to patient due to no CRM for [facility] to give results and result note not routed to [facility]. Noted in result notes by Dr. Jenny Reichmann that released to patient's MyChart.  Reason for Disposition . Caller requesting lab results  Protocols used: PCP CALL - NO TRIAGE-A-AH

## 2018-05-25 ENCOUNTER — Ambulatory Visit: Payer: Medicare Other | Admitting: Pulmonary Disease

## 2018-11-05 ENCOUNTER — Other Ambulatory Visit: Payer: Self-pay | Admitting: Internal Medicine

## 2018-11-05 NOTE — Telephone Encounter (Signed)
Done erx 

## 2019-04-09 ENCOUNTER — Other Ambulatory Visit: Payer: Self-pay | Admitting: Internal Medicine

## 2019-05-13 ENCOUNTER — Encounter: Payer: Medicare Other | Admitting: Internal Medicine

## 2019-06-04 ENCOUNTER — Encounter: Payer: Medicare Other | Admitting: Internal Medicine

## 2019-06-11 ENCOUNTER — Other Ambulatory Visit: Payer: Self-pay | Admitting: Internal Medicine

## 2019-06-11 ENCOUNTER — Encounter: Payer: Medicare Other | Admitting: Internal Medicine

## 2019-07-13 ENCOUNTER — Ambulatory Visit: Payer: Medicare Other

## 2019-07-16 ENCOUNTER — Ambulatory Visit (INDEPENDENT_AMBULATORY_CARE_PROVIDER_SITE_OTHER): Payer: Medicare Other

## 2019-07-16 ENCOUNTER — Other Ambulatory Visit: Payer: Self-pay

## 2019-07-16 ENCOUNTER — Ambulatory Visit (INDEPENDENT_AMBULATORY_CARE_PROVIDER_SITE_OTHER): Payer: Medicare Other | Admitting: Internal Medicine

## 2019-07-16 ENCOUNTER — Encounter: Payer: Self-pay | Admitting: Internal Medicine

## 2019-07-16 VITALS — BP 124/82 | HR 74 | Temp 98.1°F | Ht <= 58 in | Wt 129.5 lb

## 2019-07-16 DIAGNOSIS — E538 Deficiency of other specified B group vitamins: Secondary | ICD-10-CM

## 2019-07-16 DIAGNOSIS — Z0000 Encounter for general adult medical examination without abnormal findings: Secondary | ICD-10-CM

## 2019-07-16 DIAGNOSIS — C3490 Malignant neoplasm of unspecified part of unspecified bronchus or lung: Secondary | ICD-10-CM | POA: Diagnosis not present

## 2019-07-16 DIAGNOSIS — J449 Chronic obstructive pulmonary disease, unspecified: Secondary | ICD-10-CM | POA: Diagnosis not present

## 2019-07-16 DIAGNOSIS — E559 Vitamin D deficiency, unspecified: Secondary | ICD-10-CM | POA: Diagnosis not present

## 2019-07-16 DIAGNOSIS — E611 Iron deficiency: Secondary | ICD-10-CM

## 2019-07-16 LAB — URINALYSIS, ROUTINE W REFLEX MICROSCOPIC
Bilirubin Urine: NEGATIVE
Ketones, ur: NEGATIVE
Nitrite: NEGATIVE
Specific Gravity, Urine: 1.025 (ref 1.000–1.030)
Total Protein, Urine: NEGATIVE
Urine Glucose: NEGATIVE
Urobilinogen, UA: 0.2 (ref 0.0–1.0)
pH: 6 (ref 5.0–8.0)

## 2019-07-16 LAB — CBC WITH DIFFERENTIAL/PLATELET
Basophils Absolute: 0.1 10*3/uL (ref 0.0–0.1)
Basophils Relative: 0.8 % (ref 0.0–3.0)
Eosinophils Absolute: 0.1 10*3/uL (ref 0.0–0.7)
Eosinophils Relative: 1 % (ref 0.0–5.0)
HCT: 45.5 % (ref 36.0–46.0)
Hemoglobin: 15 g/dL (ref 12.0–15.0)
Lymphocytes Relative: 30 % (ref 12.0–46.0)
Lymphs Abs: 3.2 10*3/uL (ref 0.7–4.0)
MCHC: 33 g/dL (ref 30.0–36.0)
MCV: 92.2 fl (ref 78.0–100.0)
Monocytes Absolute: 0.8 10*3/uL (ref 0.1–1.0)
Monocytes Relative: 7.3 % (ref 3.0–12.0)
Neutro Abs: 6.5 10*3/uL (ref 1.4–7.7)
Neutrophils Relative %: 60.9 % (ref 43.0–77.0)
Platelets: 320 10*3/uL (ref 150.0–400.0)
RBC: 4.93 Mil/uL (ref 3.87–5.11)
RDW: 13.2 % (ref 11.5–15.5)
WBC: 10.6 10*3/uL — ABNORMAL HIGH (ref 4.0–10.5)

## 2019-07-16 LAB — LIPID PANEL
Cholesterol: 150 mg/dL (ref 0–200)
HDL: 55.4 mg/dL (ref >39.00)
LDL Cholesterol: 76 mg/dL (ref 0–99)
NonHDL: 94.3
Total CHOL/HDL Ratio: 3
Triglycerides: 93 mg/dL (ref 0.0–149.0)
VLDL: 18.6 mg/dL (ref 0.0–40.0)

## 2019-07-16 LAB — HEPATIC FUNCTION PANEL
ALT: 25 U/L (ref 0–35)
AST: 25 U/L (ref 0–37)
Albumin: 4.5 g/dL (ref 3.5–5.2)
Alkaline Phosphatase: 66 U/L (ref 39–117)
Bilirubin, Direct: 0.1 mg/dL (ref 0.0–0.3)
Total Bilirubin: 0.9 mg/dL (ref 0.2–1.2)
Total Protein: 7.6 g/dL (ref 6.0–8.3)

## 2019-07-16 LAB — BASIC METABOLIC PANEL
BUN: 14 mg/dL (ref 6–23)
CO2: 27 mEq/L (ref 19–32)
Calcium: 10 mg/dL (ref 8.4–10.5)
Chloride: 101 mEq/L (ref 96–112)
Creatinine, Ser: 0.91 mg/dL (ref 0.40–1.20)
GFR: 60.69 mL/min (ref >60.00)
Glucose, Bld: 85 mg/dL (ref 70–99)
Potassium: 4 mEq/L (ref 3.5–5.1)
Sodium: 139 mEq/L (ref 135–145)

## 2019-07-16 LAB — TSH: TSH: 0.85 u[IU]/mL (ref 0.35–4.50)

## 2019-07-16 LAB — VITAMIN D 25 HYDROXY (VIT D DEFICIENCY, FRACTURES): VITD: 60.47 ng/mL (ref 30.00–100.00)

## 2019-07-16 LAB — IBC PANEL
Iron: 51 ug/dL (ref 42–145)
Saturation Ratios: 12.7 % — ABNORMAL LOW (ref 20.0–50.0)
Transferrin: 286 mg/dL (ref 212.0–360.0)

## 2019-07-16 LAB — VITAMIN B12: Vitamin B-12: 200 pg/mL — ABNORMAL LOW (ref 211–911)

## 2019-07-16 NOTE — Progress Notes (Signed)
Subjective:    Patient ID: Pamela Terry, female    DOB: [birth year], 74 y.o.   MRN: [id number]  HPI  Here for wellness and f/u;  Overall doing ok;  Pt denies Chest pain, worsening SOB, DOE, wheezing, orthopnea, PND, worsening LE edema, palpitations, dizziness or syncope.  Pt denies neurological change such as new headache, facial or extremity weakness.  Pt denies polydipsia, polyuria, or low sugar symptoms. Pt states overall good compliance with treatment and medications, good tolerability, and has been trying to follow appropriate diet.  Pt denies worsening depressive symptoms, suicidal ideation or panic. No fever, night sweats, wt loss, loss of appetite, or other constitutional symptoms.  Pt states good ability with ADL's, has low fall risk, home safety reviewed and adequate, no other significant changes in hearing or vision, and only occasionally active with exercise.  Plans to go for mammogram about mar 2021 due to pandemic. Due for covid vaccine #1 on Feb 2.  Reminds me she normally has yearly cxr s/p lung ca with lobectomy Past Medical History:  Diagnosis Date  . Acute asthmatic bronchitis   . Adenocarcinoma, lung ([place])   . Allergic rhinitis   . Anxiety   . Carcinoma in situ of endocrine gland   . Colon polyp   . COPD (chronic obstructive pulmonary disease) ([place])   . Diverticulosis of colon   . History of cervical cancer   . Hypercholesterolemia   . Microscopic hematuria   . Osteoporosis    Past Surgical History:  Procedure Laterality Date  . ABDOMINAL HYSTERECTOMY     in her 30's  . APPENDECTOMY    . TOTAL ABDOMINAL HYSTERECTOMY W/ BILATERAL SALPINGOOPHORECTOMY  1985   Dr Ree Edman for Ca of cervix    reports that she quit smoking about 34 years ago. Her smoking use included cigarettes. She has a 46.00 pack-year smoking history. She has never used smokeless tobacco. She reports that she does not drink alcohol or use drugs. family history includes Asthma in her maternal aunt;  Breast cancer in her paternal grandmother; Diabetes in her brother; Heart disease in her father, maternal grandmother, and paternal grandfather; Hyperlipidemia in her father; Prostate cancer in her maternal grandfather; Rheum arthritis in her mother; Stroke in her father. Allergies  Allergen Reactions  . Augmentin [Amoxicillin-Pot Clavulanate]     Severe diarrhea  . Sulfonamide Derivatives Hives and Rash   Current Outpatient Medications on File Prior to Visit  Medication Sig Dispense Refill  . alendronate (FOSAMAX) 70 MG tablet TAKE 1 TABLET BY MOUTH ONCE A WEEK. TAKE WITH A FULL GLASS OF WATER ON AN EMPTY STOMACH. 12 tablet 3  . aspirin 81 MG tablet Take 81 mg by mouth daily.      Marland Kitchen atorvastatin (LIPITOR) 10 MG tablet TAKE 1 TABLET BY MOUTH EVERY DAY 90 tablet 0  . calcium-vitamin D (OSCAL 500/200 D-3) 500-200 MG-UNIT per tablet Take 1 tablet by mouth daily.      . Cholecalciferol (VITAMIN D) 2000 UNITS CAPS Take 1 capsule by mouth daily.      . fluticasone (FLONASE) 50 MCG/ACT nasal spray Place 2 sprays into both nostrils daily. 48 g 3  . Fluticasone-Salmeterol (AIRDUO RESPICLICK 789/38) 101-75 MCG/ACT AEPB Inhale 2 puffs into the lungs 2 (two) times daily. 3 each 3  . LORazepam (ATIVAN) 1 MG tablet TAKE 1 TABLET (1 MG TOTAL) BY MOUTH AT BEDTIME AS NEEDED. 90 tablet 1  . montelukast (SINGULAIR) 10 MG tablet Take 1 tablet (10 mg total)  by mouth at bedtime. 90 tablet 3  . FLUAD QUADRIVALENT 0.5 ML injection      No current facility-administered medications on file prior to visit.   Review of Systems All otherwise neg per pt     Objective:   Physical Exam BP 124/82 (BP Location: Left Arm, Patient Position: Sitting, Cuff Size: Normal)   Pulse 74   Temp 98.1 F (36.7 C) (Oral)   Ht 4\' 10"  (1.473 m)   Wt 129 lb 8 oz (58.7 kg)   SpO2 95%   BMI 27.07 kg/m  VS noted,  Constitutional: Pt appears in NAD HENT: Head: NCAT.  Right Ear: External ear normal.  Left Ear: External ear normal.   Eyes: . Pupils are equal, round, and reactive to light. Conjunctivae and EOM are normal Nose: without d/c or deformity Neck: Neck supple. Gross normal ROM Cardiovascular: Normal rate and regular rhythm.   Pulmonary/Chest: Effort normal and breath sounds without rales or wheezing.  Abd:  Soft, NT, ND, + BS, no organomegaly Neurological: Pt is alert. At baseline orientation, motor grossly intact Skin: Skin is warm. No rashes, other new lesions, no LE edema Psychiatric: Pt behavior is normal without agitation  All otherwise neg per pt  Lab Results  Component Value Date   WBC 10.6 (H) 07/16/2019   HGB 15.0 07/16/2019   HCT 45.5 07/16/2019   PLT 320.0 07/16/2019   GLUCOSE 85 07/16/2019   CHOL 150 07/16/2019   TRIG 93.0 07/16/2019   HDL 55.40 07/16/2019   LDLCALC 76 07/16/2019   ALT 25 07/16/2019   AST 25 07/16/2019   NA 139 07/16/2019   K 4.0 07/16/2019   CL 101 07/16/2019   CREATININE 0.91 07/16/2019   BUN 14 07/16/2019   CO2 27 07/16/2019   TSH 0.85 07/16/2019         Assessment & Plan:

## 2019-07-16 NOTE — Patient Instructions (Signed)
Please continue all other medications as before, and refills have been done if requested.  Please have the pharmacy call with any other refills you may need.  Please continue your efforts at being more active, low cholesterol diet, and weight control.  You are otherwise up to date with prevention measures today.  Please keep your appointments with your specialists as you may have planned  Please go to the XRAY Department in the first floor for the x-ray testing  Please go to the LAB at the blood drawing area for the tests to be done  You will be contacted by phone if any changes need to be made immediately.  Otherwise, you will receive a letter about your results with an explanation, but please check with MyChart first.  Please remember to sign up for MyChart if you have not done so, as this will be important to you in the future with finding out test results, communicating by private email, and scheduling acute appointments online when needed.  Please make an Appointment to return for your 1 year visit, or sooner if needed, with Lab testing by Appointment as well, to be done about 3-5 days before at the [facility] (so this is for TWO appointments - please see the scheduling desk as you leave)

## 2019-07-17 ENCOUNTER — Encounter: Payer: Self-pay | Admitting: Internal Medicine

## 2019-07-17 NOTE — Assessment & Plan Note (Signed)
Without known recurrence, for yearly surveillance cxr

## 2019-07-17 NOTE — Assessment & Plan Note (Signed)

## 2019-07-18 ENCOUNTER — Other Ambulatory Visit: Payer: Self-pay | Admitting: Internal Medicine

## 2019-07-18 MED ORDER — VITAMIN B-12 1000 MCG PO TABS: 1000.0000 ug | ORAL_TABLET | Freq: Every day | ORAL | 1 refills | Status: DC

## 2019-07-19 ENCOUNTER — Encounter: Payer: Self-pay | Admitting: Internal Medicine

## 2019-07-19 ENCOUNTER — Telehealth: Payer: Self-pay

## 2019-07-19 DIAGNOSIS — Z1211 Encounter for screening for malignant neoplasm of colon: Secondary | ICD-10-CM | POA: Diagnosis not present

## 2019-07-19 DIAGNOSIS — K5730 Diverticulosis of large intestine without perforation or abscess without bleeding: Secondary | ICD-10-CM | POA: Diagnosis not present

## 2019-07-19 DIAGNOSIS — Z86010 Personal history of colonic polyps: Secondary | ICD-10-CM | POA: Diagnosis not present

## 2019-07-19 LAB — HM COLONOSCOPY

## 2019-07-19 MED ORDER — FLUTICASONE PROPIONATE 50 MCG/ACT NA SUSP: 2.0000 | Freq: Every day | NASAL | 3 refills | Status: DC

## 2019-07-19 MED ORDER — ATORVASTATIN CALCIUM 10 MG PO TABS: 10.0000 mg | ORAL_TABLET | Freq: Every day | ORAL | 3 refills | Status: DC

## 2019-07-19 MED ORDER — LORAZEPAM 1 MG PO TABS: 1.0000 mg | ORAL_TABLET | Freq: Every evening | ORAL | 1 refills | Status: DC | PRN

## 2019-07-19 MED ORDER — ALENDRONATE SODIUM 70 MG PO TABS: ORAL_TABLET | ORAL | 3 refills | Status: DC

## 2019-07-19 NOTE — Addendum Note (Signed)
Addended by: Karle Barr on: 07/19/2019 04:49 PM   Modules accepted: Orders

## 2019-07-19 NOTE — Telephone Encounter (Signed)
Pt is requesting refill of lorazepam.

## 2019-07-19 NOTE — Telephone Encounter (Signed)
Done erx 

## 2019-07-22 ENCOUNTER — Ambulatory Visit: Payer: Medicare Other

## 2019-07-27 ENCOUNTER — Other Ambulatory Visit: Payer: Self-pay

## 2019-07-27 ENCOUNTER — Ambulatory Visit: Payer: Medicare Other

## 2019-07-27 DIAGNOSIS — L708 Other acne: Secondary | ICD-10-CM | POA: Diagnosis not present

## 2019-07-27 NOTE — Telephone Encounter (Signed)
Medication Refill - Medication: montelukast (SINGULAIR) 10 MG tablet   Has the patient contacted their pharmacy? Yes.   (Agent: If no, request that the patient contact the pharmacy for the refill.) (Agent: If yes, when and what did the pharmacy advise?)  Preferred Pharmacy (with phone number or street name): [id number] - [place], [place] - [address]  Agent: Please be advised that RX refills may take up to 3 business days. We ask that you follow-up with your pharmacy.

## 2019-07-29 ENCOUNTER — Ambulatory Visit: Payer: Medicare Other

## 2019-07-29 MED ORDER — MONTELUKAST SODIUM 10 MG PO TABS: 10.0000 mg | ORAL_TABLET | Freq: Every day | ORAL | 3 refills | Status: DC

## 2019-07-29 NOTE — Telephone Encounter (Signed)
Reviewed chart pt is up-to-date sent refills to [facility].../l[facility]  

## 2019-07-30 ENCOUNTER — Ambulatory Visit: Payer: Medicare Other | Attending: Internal Medicine

## 2019-07-30 DIAGNOSIS — Z23 Encounter for immunization: Secondary | ICD-10-CM | POA: Insufficient documentation

## 2019-07-30 NOTE — Progress Notes (Signed)
   Covid-19 Vaccination Clinic  Name:  Pamela Terry    MRN: [id number] DOB: 1945/08/04  07/30/2019  [name] was observed post Covid-19 immunization for 15 minutes without incidence. She was provided with Vaccine Information Sheet and instruction to access the V-Safe system.   [name] was instructed to call 911 with any severe reactions post vaccine: Marland Kitchen Difficulty breathing  . Swelling of your face and throat  . A fast heartbeat  . A bad rash all over your body  . Dizziness and weakness    Immunizations Administered    Name Date Dose VIS Date Route   Pfizer COVID-19 Vaccine 07/30/2019  3:18 PM 0.3 mL 06/04/2019 Intramuscular   Manufacturer: [place]   Lot: [id number]   [place]: [id number]

## 2019-08-24 ENCOUNTER — Ambulatory Visit: Payer: Medicare Other | Attending: Internal Medicine

## 2019-08-24 DIAGNOSIS — Z23 Encounter for immunization: Secondary | ICD-10-CM | POA: Insufficient documentation

## 2019-08-24 NOTE — Progress Notes (Signed)
   Covid-19 Vaccination Clinic  Name:  Pamela Terry    MRN: [id number] DOB: Nov 16, 1945  08/24/2019  Pamela Terry was observed post Covid-19 immunization for 15 minutes without incident. She was provided with Vaccine Information Sheet and instruction to access the V-Safe system.   Pamela Terry was instructed to call 911 with any severe reactions post vaccine: Marland Kitchen Difficulty breathing  . Swelling of face and throat  . A fast heartbeat  . A bad rash all over body  . Dizziness and weakness   Immunizations Administered    Name Date Dose VIS Date Route   Pfizer COVID-19 Vaccine 08/24/2019  8:46 AM 0.3 mL 06/04/2019 Intramuscular   Manufacturer: [place]   Lot: [id number]   [place]: [id number]

## 2020-07-12 ENCOUNTER — Other Ambulatory Visit: Payer: Medicare Other

## 2020-07-18 ENCOUNTER — Other Ambulatory Visit: Payer: Self-pay | Admitting: Internal Medicine

## 2020-07-18 ENCOUNTER — Encounter: Payer: Medicare Other | Admitting: Internal Medicine

## 2020-07-18 NOTE — Telephone Encounter (Signed)
Please refill as per office routine med refill policy (all routine meds refilled for 3 mo or monthly per pt preference up to one year from last visit, then month to month grace period for 3 mo, then further med refills will have to be denied)  

## 2020-07-30 ENCOUNTER — Encounter: Payer: Self-pay | Admitting: Internal Medicine

## 2020-07-30 DIAGNOSIS — E538 Deficiency of other specified B group vitamins: Secondary | ICD-10-CM

## 2020-07-30 DIAGNOSIS — E611 Iron deficiency: Secondary | ICD-10-CM

## 2020-07-30 DIAGNOSIS — E559 Vitamin D deficiency, unspecified: Secondary | ICD-10-CM

## 2020-07-30 DIAGNOSIS — C3490 Malignant neoplasm of unspecified part of unspecified bronchus or lung: Secondary | ICD-10-CM

## 2020-07-31 NOTE — Telephone Encounter (Signed)
Staff to assist pt with appt to have pre visit lab and xray on first floor, please

## 2020-08-01 ENCOUNTER — Other Ambulatory Visit: Payer: Self-pay

## 2020-08-01 ENCOUNTER — Telehealth: Payer: Self-pay

## 2020-08-01 NOTE — Telephone Encounter (Signed)
Called pt to set up lab and xray appointment left voicemail to return office call

## 2020-08-02 ENCOUNTER — Ambulatory Visit (INDEPENDENT_AMBULATORY_CARE_PROVIDER_SITE_OTHER): Payer: Medicare Other | Admitting: Internal Medicine

## 2020-08-02 ENCOUNTER — Encounter: Payer: Self-pay | Admitting: Internal Medicine

## 2020-08-02 ENCOUNTER — Ambulatory Visit (INDEPENDENT_AMBULATORY_CARE_PROVIDER_SITE_OTHER): Payer: Medicare Other

## 2020-08-02 VITALS — BP 120/60 | HR 64 | Temp 98.2°F | Ht <= 58 in | Wt 124.4 lb

## 2020-08-02 VITALS — BP 120/60 | HR 64 | Temp 98.3°F | Ht <= 58 in | Wt 124.4 lb

## 2020-08-02 DIAGNOSIS — R6889 Other general symptoms and signs: Secondary | ICD-10-CM

## 2020-08-02 DIAGNOSIS — E7800 Pure hypercholesterolemia, unspecified: Secondary | ICD-10-CM | POA: Diagnosis not present

## 2020-08-02 DIAGNOSIS — R3129 Other microscopic hematuria: Secondary | ICD-10-CM

## 2020-08-02 DIAGNOSIS — C3490 Malignant neoplasm of unspecified part of unspecified bronchus or lung: Secondary | ICD-10-CM

## 2020-08-02 DIAGNOSIS — E538 Deficiency of other specified B group vitamins: Secondary | ICD-10-CM | POA: Diagnosis not present

## 2020-08-02 DIAGNOSIS — J309 Allergic rhinitis, unspecified: Secondary | ICD-10-CM | POA: Diagnosis not present

## 2020-08-02 DIAGNOSIS — Z0000 Encounter for general adult medical examination without abnormal findings: Secondary | ICD-10-CM | POA: Diagnosis not present

## 2020-08-02 DIAGNOSIS — E559 Vitamin D deficiency, unspecified: Secondary | ICD-10-CM

## 2020-08-02 DIAGNOSIS — J449 Chronic obstructive pulmonary disease, unspecified: Secondary | ICD-10-CM | POA: Diagnosis not present

## 2020-08-02 DIAGNOSIS — Z0001 Encounter for general adult medical examination with abnormal findings: Secondary | ICD-10-CM | POA: Diagnosis not present

## 2020-08-02 LAB — URINALYSIS, ROUTINE W REFLEX MICROSCOPIC
Bilirubin Urine: NEGATIVE
Ketones, ur: NEGATIVE
Leukocytes,Ua: NEGATIVE
Nitrite: NEGATIVE
Specific Gravity, Urine: 1.01 (ref 1.000–1.030)
Total Protein, Urine: NEGATIVE
Urine Glucose: NEGATIVE
Urobilinogen, UA: 0.2 (ref 0.0–1.0)
pH: 7 (ref 5.0–8.0)

## 2020-08-02 LAB — CBC WITH DIFFERENTIAL/PLATELET
Basophils Absolute: 0.1 10*3/uL (ref 0.0–0.1)
Basophils Relative: 1 % (ref 0.0–3.0)
Eosinophils Absolute: 0.2 10*3/uL (ref 0.0–0.7)
Eosinophils Relative: 1.8 % (ref 0.0–5.0)
HCT: 44.1 % (ref 36.0–46.0)
Hemoglobin: 14.8 g/dL (ref 12.0–15.0)
Lymphocytes Relative: 34.1 % (ref 12.0–46.0)
Lymphs Abs: 3.1 10*3/uL (ref 0.7–4.0)
MCHC: 33.6 g/dL (ref 30.0–36.0)
MCV: 90.5 fl (ref 78.0–100.0)
Monocytes Absolute: 0.6 10*3/uL (ref 0.1–1.0)
Monocytes Relative: 6.7 % (ref 3.0–12.0)
Neutro Abs: 5.1 10*3/uL (ref 1.4–7.7)
Neutrophils Relative %: 56.4 % (ref 43.0–77.0)
Platelets: 302 10*3/uL (ref 150.0–400.0)
RBC: 4.87 Mil/uL (ref 3.87–5.11)
RDW: 13.2 % (ref 11.5–15.5)
WBC: 9.1 10*3/uL (ref 4.0–10.5)

## 2020-08-02 LAB — BASIC METABOLIC PANEL
BUN: 10 mg/dL (ref 6–23)
CO2: 30 mEq/L (ref 19–32)
Calcium: 10 mg/dL (ref 8.4–10.5)
Chloride: 104 mEq/L (ref 96–112)
Creatinine, Ser: 0.84 mg/dL (ref 0.40–1.20)
GFR: 68.9 mL/min (ref >60.00)
Glucose, Bld: 83 mg/dL (ref 70–99)
Potassium: 4.2 mEq/L (ref 3.5–5.1)
Sodium: 141 mEq/L (ref 135–145)

## 2020-08-02 LAB — HEPATIC FUNCTION PANEL
ALT: 23 U/L (ref 0–35)
AST: 23 U/L (ref 0–37)
Albumin: 4.4 g/dL (ref 3.5–5.2)
Alkaline Phosphatase: 69 U/L (ref 39–117)
Bilirubin, Direct: 0.1 mg/dL (ref 0.0–0.3)
Total Bilirubin: 0.8 mg/dL (ref 0.2–1.2)
Total Protein: 7.6 g/dL (ref 6.0–8.3)

## 2020-08-02 LAB — LIPID PANEL
Cholesterol: 159 mg/dL (ref 0–200)
HDL: 64.9 mg/dL (ref >39.00)
LDL Cholesterol: 77 mg/dL (ref 0–99)
NonHDL: 93.72
Total CHOL/HDL Ratio: 2
Triglycerides: 83 mg/dL (ref 0.0–149.0)
VLDL: 16.6 mg/dL (ref 0.0–40.0)

## 2020-08-02 LAB — VITAMIN D 25 HYDROXY (VIT D DEFICIENCY, FRACTURES): VITD: 54.4 ng/mL (ref 30.00–100.00)

## 2020-08-02 LAB — TSH: TSH: 1.17 u[IU]/mL (ref 0.35–4.50)

## 2020-08-02 LAB — VITAMIN B12: Vitamin B-12: 679 pg/mL (ref 211–911)

## 2020-08-02 MED ORDER — MONTELUKAST SODIUM 10 MG PO TABS: ORAL_TABLET | ORAL | 3 refills | Status: DC

## 2020-08-02 MED ORDER — ALENDRONATE SODIUM 70 MG PO TABS: ORAL_TABLET | ORAL | 3 refills | Status: DC

## 2020-08-02 MED ORDER — FLUTICASONE PROPIONATE 50 MCG/ACT NA SUSP: 2.0000 | Freq: Every day | NASAL | 3 refills | Status: DC

## 2020-08-02 MED ORDER — ATORVASTATIN CALCIUM 10 MG PO TABS: 10.0000 mg | ORAL_TABLET | Freq: Every day | ORAL | 3 refills | Status: DC

## 2020-08-02 NOTE — Assessment & Plan Note (Signed)
With persistent drainage and cough - for cxr, but also flonase restart

## 2020-08-02 NOTE — Patient Instructions (Signed)
Please continue all other medications as before, and refills have been done if requested.  Please have the pharmacy call with any other refills you may need.  Please continue your efforts at being more active, low cholesterol diet, and weight control.  You are otherwise up to date with prevention measures today.  Please keep your appointments with your specialists as you may have planned  Please go to the XRAY Department in the first floor for the x-ray testing  Please go to the LAB at the blood drawing area for the tests to be done  You will be contacted by phone if any changes need to be made immediately.  Otherwise, you will receive a letter about your results with an explanation, but please check with MyChart first.  Please remember to sign up for MyChart if you have not done so, as this will be important to you in the future with finding out test results, communicating by private email, and scheduling acute appointments online when needed.  Please make an Appointment to return in 6 months, or sooner if needed

## 2020-08-02 NOTE — Progress Notes (Signed)
Patient ID: Pamela Terry, female   DOB: 06-Sep-1945, 75 y.o.   MRN: [id number]         Chief Complaint:: wellness exam and worsening forgetfulness, allergies, copd, hld, microhematuria, b12 deficiency , hx of lung ca       HPI:  Pamela Terry is a 75 y.o. female here for wellness exam; plans for mammogram in mar 2022 on her own, o/w up to date   Wt Readings from Last 3 Encounters:  08/02/20 124 lb 6.4 oz (56.4 kg)  08/02/20 124 lb 6.4 oz (56.4 kg)  07/16/19 129 lb 8 oz (58.7 kg)   BP Readings from Last 3 Encounters:  08/02/20 120/60  08/02/20 120/60  07/16/19 124/82   Immunization History  Administered Date(s) Administered  . Fluad Quad(high Dose 65+) 02/26/2019  . H1N1 06/21/2008  . Influenza Split 04/05/2011, 04/14/2012, 04/05/2013, 03/28/2014  . Influenza Whole 04/02/2010  . Influenza, High Dose Seasonal PF 03/23/2018  . Influenza,inj,Quad PF,6+ Mos 04/10/2015, 04/15/2016  . Influenza-Unspecified 03/24/2017, 03/23/2018, 04/24/2020  . PFIZER(Purple Top)SARS-COV-2 Vaccination 07/30/2019, 08/24/2019, 04/24/2020  . Pneumococcal Conjugate-13 05/14/2016  . Pneumococcal Polysaccharide-23 05/18/2012  . Tdap 05/20/2011  . Zoster 07/08/2011   There are no preventive care reminders to display for this patient.       Also having some noticeable and more consistent forgetfulness with remembering nouns in the middle of a sentence, but then can remember later on. Overall mild to mod, intemittent, nothing makes better or worse for several months  Also Denies urinary symptoms such as dysuria, frequency, urgency, flank pain, hematuria or n/v, fever, chills.  Due for cxr f/u per pt, was advised for f/u cxr every 1-2 yrs per pt.  Trying to follow lower chol diet.  Does have several wks ongoing nasal allergy symptoms with clearish congestion, itch and sneezing, without fever, pain, ST, cough, swelling or wheezing.    Past Medical History:  Diagnosis Date  . Acute asthmatic bronchitis   .  Adenocarcinoma, lung ([place])   . Allergic rhinitis   . Anxiety   . Carcinoma in situ of endocrine gland   . Colon polyp   . COPD (chronic obstructive pulmonary disease) ([place])   . Diverticulosis of colon   . History of cervical cancer   . Hypercholesterolemia   . Microscopic hematuria   . Osteoporosis    Past Surgical History:  Procedure Laterality Date  . ABDOMINAL HYSTERECTOMY     in her 30's  . APPENDECTOMY    . TOTAL ABDOMINAL HYSTERECTOMY W/ BILATERAL SALPINGOOPHORECTOMY  1985   Dr Ree Edman for Ca of cervix    reports that she quit smoking about 35 years ago. Her smoking use included cigarettes. She has a 46.00 pack-year smoking history. She has never used smokeless tobacco. She reports that she does not drink alcohol and does not use drugs. family history includes Asthma in her maternal aunt; Breast cancer in her paternal grandmother; Diabetes in her brother; Heart disease in her father, maternal grandmother, and paternal grandfather; Hyperlipidemia in her father; Prostate cancer in her maternal grandfather; Rheum arthritis in her mother; Stroke in her father. Allergies  Allergen Reactions  . Augmentin [Amoxicillin-Pot Clavulanate]     Severe diarrhea  . Sulfonamide Derivatives Hives and Rash   Current Outpatient Medications on File Prior to Visit  Medication Sig Dispense Refill  . Apoaequorin (PREVAGEN) 10 MG CAPS Take 20 mg by mouth daily.    Marland Kitchen aspirin 81 MG tablet Take 81 mg by mouth daily.    Marland Kitchen  calcium-vitamin D (OSCAL WITH D) 500-200 MG-UNIT tablet Take 1 tablet by mouth daily.    . Cholecalciferol (VITAMIN D) 2000 UNITS CAPS Take 1 capsule by mouth daily.    . Fluticasone-Salmeterol (AIRDUO RESPICLICK 478/29) 562-13 MCG/ACT AEPB Inhale 2 puffs into the lungs 2 (two) times daily. 3 each 3  . LORazepam (ATIVAN) 1 MG tablet Take 1 tablet (1 mg total) by mouth at bedtime as needed. 90 tablet 1  . vitamin B-12 (CYANOCOBALAMIN) 1000 MCG tablet Take 1 tablet (1,000 mcg total) by  mouth daily. 90 tablet 1   No current facility-administered medications on file prior to visit.        ROS:  All others reviewed and negative.  Objective        PE:  BP 120/60   Pulse 64   Temp 98.3 F (36.8 C) (Oral)   Ht 4\' 10"  (1.473 m)   Wt 124 lb 6.4 oz (56.4 kg)   SpO2 94%   BMI 26.00 kg/m                 Constitutional: Pt appears in NAD               HENT: Head: NCAT.                Right Ear: External ear normal.                 Left Ear: External ear normal.                Eyes: . Pupils are equal, round, and reactive to light. Conjunctivae and EOM are normal               Nose: without d/c or deformity               Neck: Neck supple. Gross normal ROM               Cardiovascular: Normal rate and regular rhythm.                 Pulmonary/Chest: Effort normal and breath sounds without rales or wheezing.                Abd:  Soft, NT, ND, + BS, no organomegaly               Neurological: Pt is alert. At baseline orientation, motor grossly intact               Skin: Skin is warm. No rashes, no other new lesions, LE edema - none               Psychiatric: Pt behavior is normal without agitation   Micro: none  Cardiac tracings I have personally interpreted today:  none  Pertinent Radiological findings (summarize): none   Lab Results  Component Value Date   WBC 9.1 08/02/2020   HGB 14.8 08/02/2020   HCT 44.1 08/02/2020   PLT 302.0 08/02/2020   GLUCOSE 83 08/02/2020   CHOL 159 08/02/2020   TRIG 83.0 08/02/2020   HDL 64.90 08/02/2020   LDLCALC 77 08/02/2020   ALT 23 08/02/2020   AST 23 08/02/2020   NA 141 08/02/2020   K 4.2 08/02/2020   CL 104 08/02/2020   CREATININE 0.84 08/02/2020   BUN 10 08/02/2020   CO2 30 08/02/2020   TSH 1.17 08/02/2020   Assessment/Plan:  Pamela Terry is a 75 y.o. White or Caucasian [1] female with  has  a past medical history of Acute asthmatic bronchitis, Adenocarcinoma, lung ([place]), Allergic rhinitis, Anxiety, Carcinoma in  situ of endocrine gland, Colon polyp, COPD (chronic obstructive pulmonary disease) ([place]), Diverticulosis of colon, History of cervical cancer, Hypercholesterolemia, Microscopic hematuria, and Osteoporosis. Microscopic hematuria Chronic stable persistent,  to f/u any worsening symptoms or concerns, for UA toay  Allergic rhinitis With persistent drainage and cough - for cxr, but also flonase restart  Encounter for well adult exam with abnormal findings Age and sex appropriate education and counseling updated with regular exercise and diet Referrals for preventative services - for mammogram per pt in mar 2022 Immunizations addressed - none needed Smoking counseling  - none needed Evidence for depression or other mood disorder - none significant Most recent labs reviewed. I have personally reviewed and have noted: 1) the patient's medical and social history 2) The patient's current medications and supplements 3) The patient's height, weight, and BMI have been recorded in the chart   B12 deficiency Lab Results  Component Value Date   VITAMINB12 679 08/02/2020   Stable, cont oral replacement - b12 1000 mcg qd   COPD (chronic obstructive pulmonary disease) with chronic bronchitis (HCC) Overall stable, for cxr, cont current med tx - airduo, singulair  Current Outpatient Medications (Endocrine & Metabolic):  .  alendronate (FOSAMAX) 70 MG tablet, TAKE 1 TABLET BY MOUTH ONCE A WEEK. TAKE WITH A FULL GLASS OF WATER ON AN EMPTY STOMACH.  Current Outpatient Medications (Cardiovascular):  .  atorvastatin (LIPITOR) 10 MG tablet, Take 1 tablet (10 mg total) by mouth daily.  Current Outpatient Medications (Respiratory):  Marland Kitchen  Fluticasone-Salmeterol (AIRDUO RESPICLICK 607/37) 106-26 MCG/ACT AEPB, Inhale 2 puffs into the lungs 2 (two) times daily. .  fluticasone (FLONASE) 50 MCG/ACT nasal spray, Place 2 sprays into both nostrils daily. .  montelukast (SINGULAIR) 10 MG tablet, TAKE 1 TABLET BY  MOUTH EVERYDAY AT BEDTIME  Current Outpatient Medications (Analgesics):  .  aspirin 81 MG tablet, Take 81 mg by mouth daily.  Current Outpatient Medications (Hematological):  .  vitamin B-12 (CYANOCOBALAMIN) 1000 MCG tablet, Take 1 tablet (1,000 mcg total) by mouth daily.  Current Outpatient Medications (Other):  .  Apoaequorin (PREVAGEN) 10 MG CAPS, Take 20 mg by mouth daily. .  calcium-vitamin D (OSCAL WITH D) 500-200 MG-UNIT tablet, Take 1 tablet by mouth daily. .  Cholecalciferol (VITAMIN D) 2000 UNITS CAPS, Take 1 capsule by mouth daily. Marland Kitchen  LORazepam (ATIVAN) 1 MG tablet, Take 1 tablet (1 mg total) by mouth at bedtime as needed.   Forgetfulness appaers c/w benign forgetfulness, cont b12, consider MRI for any worsening  HYPERCHOLESTEROLEMIA Lab Results  Component Value Date   LDLCALC 77 08/02/2020   Stable, pt to continue current statin lipitor 10   Followup: Return in about 6 months (around 01/30/2021).  Cathlean Cower, MD 08/09/2020 9:01 PM [facility]

## 2020-08-02 NOTE — Progress Notes (Signed)
Subjective:   Pamela Terry is a 75 y.o. female who presents for Medicare Annual (Subsequent) preventive examination.  Review of Systems    No ROS. Medicare Wellness Visit. Additional risk factors are reflected in social history. Cardiac Risk Factors include: advanced age (>75men, >61 women);dyslipidemia;family history of premature cardiovascular disease     Objective:    Today's Vitals   08/02/20 1234  BP: 120/60  Pulse: 64  Temp: 98.2 F (36.8 C)  SpO2: 94%  Weight: 124 lb 6.4 oz (56.4 kg)  Height: 4\' 10"  (1.473 m)  PainSc: 0-No pain   Body mass index is 26 kg/m.  Advanced Directives 08/02/2020  Does Patient Have a Medical Advance Directive? No  Would patient like information on creating a medical advance directive? No - Patient declined    Current Medications (verified) Outpatient Encounter Medications as of 08/02/2020  Medication Sig  . alendronate (FOSAMAX) 70 MG tablet TAKE 1 TABLET BY MOUTH ONCE A WEEK. TAKE WITH A FULL GLASS OF WATER ON AN EMPTY STOMACH.  Marland Kitchen Apoaequorin (PREVAGEN) 10 MG CAPS Take 20 mg by mouth daily.  Marland Kitchen aspirin 81 MG tablet Take 81 mg by mouth daily.  Marland Kitchen atorvastatin (LIPITOR) 10 MG tablet Take 1 tablet (10 mg total) by mouth daily.  . calcium-vitamin D (OSCAL WITH D) 500-200 MG-UNIT tablet Take 1 tablet by mouth daily.  . Cholecalciferol (VITAMIN D) 2000 UNITS CAPS Take 1 capsule by mouth daily.  Marland Kitchen FLUAD QUADRIVALENT 0.5 ML injection   . fluticasone (FLONASE) 50 MCG/ACT nasal spray Place 2 sprays into both nostrils daily.  Marland Kitchen LORazepam (ATIVAN) 1 MG tablet Take 1 tablet (1 mg total) by mouth at bedtime as needed.  . montelukast (SINGULAIR) 10 MG tablet TAKE 1 TABLET BY MOUTH EVERYDAY AT BEDTIME  . vitamin B-12 (CYANOCOBALAMIN) 1000 MCG tablet Take 1 tablet (1,000 mcg total) by mouth daily.  . Fluticasone-Salmeterol (AIRDUO RESPICLICK 333/54) 562-56 MCG/ACT AEPB Inhale 2 puffs into the lungs 2 (two) times daily.   No facility-administered  encounter medications on file as of 08/02/2020.    Allergies (verified) Augmentin [amoxicillin-pot clavulanate] and Sulfonamide derivatives   History: Past Medical History:  Diagnosis Date  . Acute asthmatic bronchitis   . Adenocarcinoma, lung ([place])   . Allergic rhinitis   . Anxiety   . Carcinoma in situ of endocrine gland   . Colon polyp   . COPD (chronic obstructive pulmonary disease) ([place])   . Diverticulosis of colon   . History of cervical cancer   . Hypercholesterolemia   . Microscopic hematuria   . Osteoporosis    Past Surgical History:  Procedure Laterality Date  . ABDOMINAL HYSTERECTOMY     in her 30's  . APPENDECTOMY    . TOTAL ABDOMINAL HYSTERECTOMY W/ BILATERAL SALPINGOOPHORECTOMY  1985   Dr Ree Edman for Ca of cervix   Family History  Problem Relation Age of Onset  . Asthma Maternal Aunt   . Heart disease Father   . Hyperlipidemia Father   . Stroke Father   . Heart disease Paternal Grandfather   . Heart disease Maternal Grandmother   . Rheum arthritis Mother   . Breast cancer Paternal Grandmother   . Prostate cancer Maternal Grandfather   . Diabetes Brother    Social History   Socioeconomic History  . Marital status: Married    Spouse name: Not on file  . Number of children: 1  . Years of education: Not on file  . Highest education level: Not  on file  Occupational History  . Occupation: Optician, dispensing  Tobacco Use  . Smoking status: Former Smoker    Packs/day: 2.00    Years: 23.00    Pack years: 46.00    Types: Cigarettes    Quit date: 06/24/1985    Years since quitting: 35.1  . Smokeless tobacco: Never Used  Vaping Use  . Vaping Use: Never used  Substance and Sexual Activity  . Alcohol use: No    Alcohol/week: 0.0 standard drinks  . Drug use: No  . Sexual activity: Never  Other Topics Concern  . Not on file  Social History Narrative   Regular exercise: walk   Caffeine use: 3 sodas daily, sweet tea   Social Determinants of Health    Financial Resource Strain: Low Risk   . Difficulty of Paying Living Expenses: Not hard at all  Food Insecurity: No Food Insecurity  . Worried About Charity fundraiser in the Last Year: Never true  . Ran Out of Food in the Last Year: Never true  Transportation Needs: No Transportation Needs  . Lack of Transportation (Medical): No  . Lack of Transportation (Non-Medical): No  Physical Activity: Sufficiently Active  . Days of Exercise per Week: 5 days  . Minutes of Exercise per Session: 30 min  Stress: No Stress Concern Present  . Feeling of Stress : Not at all  Social Connections: Socially Integrated  . Frequency of Communication with Friends and Family: More than three times a week  . Frequency of Social Gatherings with Friends and Family: Once a week  . Attends Religious Services: More than 4 times per year  . Active Member of Clubs or Organizations: Yes  . Attends Archivist Meetings: More than 4 times per year  . Marital Status: Married    Tobacco Counseling Counseling given: Not Answered   Clinical Intake:     Pain : No/denies pain Pain Score: 0-No pain     BMI - recorded: 26 Nutritional Status: BMI 25 -29 Overweight Nutritional Risks: None Diabetes: No  How often do you need to have someone help you when you read instructions, pamphlets, or other written materials from your doctor or pharmacy?: 1 - Never What is the last grade level you completed in school?: Bachelor's Degree  Diabetic? no  Interpreter Needed?: No  Information entered by :: Charnae Lill N. Linley Moxley, LPN   Activities of Daily Living In your present state of health, do you have any difficulty performing the following activities: 08/02/2020 08/02/2020  Hearing? N N  Vision? N N  Difficulty concentrating or making decisions? N Y  Walking or climbing stairs? N N  Dressing or bathing? N N  Doing errands, shopping? N N  Preparing Food and eating ? N -  Using the Toilet? N -  In the past  six months, have you accidently leaked urine? N -  Do you have problems with loss of bowel control? N -  Managing your Medications? N -  Managing your Finances? N -  Some recent data might be hidden    Patient Care Team: Biagio Borg, MD as PCP - General (Internal Medicine)  Indicate any recent Medical Services you may have received from other than [facility] providers in the past year (date may be approximate).     Assessment:   This is a routine wellness examination for [facility].  Hearing/Vision screen No exam data present  Dietary issues and exercise activities discussed: Current Exercise Habits: Home exercise routine,  Frequency (Times/Week): 5, Intensity: Moderate, Exercise limited by: respiratory conditions(s)  Goals   None    Depression Screen PHQ 2/9 Scores 08/02/2020 08/02/2020 07/16/2019 07/16/2019 05/11/2018 05/07/2017  PHQ - 2 Score 0 0 0 0 0 0  PHQ- 9 Score - - - - 0 0    Fall Risk Fall Risk  08/02/2020 08/02/2020 07/16/2019 07/16/2019 05/11/2018  Falls in the past year? 1 1 0 1 0  Number falls in past yr: 0 0 - 0 -  Injury with Fall? 0 0 - 1 -  Risk for fall due to : No Fall Risks - - No Fall Risks -    FALL RISK PREVENTION PERTAINING TO THE HOME:  Any stairs in or around the home? Yes  If so, are there any without handrails? No  Home free of loose throw rugs in walkways, pet beds, electrical cords, etc? Yes  Adequate lighting in your home to reduce risk of falls? Yes   ASSISTIVE DEVICES UTILIZED TO PREVENT FALLS:  Life alert? No  Use of a cane, walker or w/c? No  Grab bars in the bathroom? Yes  Shower chair or bench in shower? Yes  Elevated toilet seat or a handicapped toilet? Yes   TIMED UP AND GO:  Was the test performed? No .  Length of time to ambulate 10 feet: 0 sec.   Gait steady and fast without use of assistive device  Cognitive Function: MMSE - Mini Mental State Exam 08/02/2020  Orientation to time 5  Orientation to Place 5  Registration 3  Attention/  Calculation 5  Recall 3  Language- name 2 objects 2  Language- repeat 1  Language- follow 3 step command 3  Language- read & follow direction 1  Write a sentence 1  Copy design 0  Total score 29        Immunizations Immunization History  Administered Date(s) Administered  . Fluad Quad(high Dose 65+) 02/26/2019  . H1N1 06/21/2008  . Influenza Split 04/05/2011, 04/14/2012, 04/05/2013, 03/28/2014  . Influenza Whole 04/02/2010  . Influenza, High Dose Seasonal PF 03/23/2018  . Influenza,inj,Quad PF,6+ Mos 04/10/2015, 04/15/2016  . Influenza-Unspecified 03/24/2017, 03/23/2018  . PFIZER(Purple Top)SARS-COV-2 Vaccination 07/30/2019, 08/24/2019  . Pneumococcal Conjugate-13 05/14/2016  . Pneumococcal Polysaccharide-23 05/18/2012  . Tdap 05/20/2011  . Zoster 07/08/2011    TDAP status: Up to date  Flu Vaccine status: Up to date  Pneumococcal vaccine status: Up to date  Covid-19 vaccine status: Completed vaccines  Qualifies for Shingles Vaccine? Yes   Zostavax completed Yes   Shingrix Completed?: No.    Education has been provided regarding the importance of this vaccine. Patient has been advised to call insurance company to determine out of pocket expense if they have not yet received this vaccine. Advised may also receive vaccine at local pharmacy or Health Dept. Verbalized acceptance and understanding.  Screening Tests Health Maintenance  Topic Date Due  . MAMMOGRAM  01/23/2019  . COVID-19 Vaccine (3 - Pfizer risk 4-dose series) 09/21/2019  . INFLUENZA VACCINE  01/23/2020  . TETANUS/TDAP  05/19/2021  . COLONOSCOPY (Pts 45-56yrs Insurance coverage will need to be confirmed)  07/18/2024  . DEXA SCAN  Completed  . Hepatitis C Screening  Completed  . PNA vac Low Risk Adult  Completed    Health Maintenance  Health Maintenance Due  Topic Date Due  . MAMMOGRAM  01/23/2019  . COVID-19 Vaccine (3 - Pfizer risk 4-dose series) 09/21/2019  . INFLUENZA VACCINE  01/23/2020  Colorectal cancer screening: Type of screening: Colonoscopy. Completed 07/19/2019. Repeat every 5 years  Mammogram status: Completed 01/22/2017. Repeat every year  Bone Density status: Completed 08/26/2017. Results reflect: Bone density results: OSTEOPOROSIS. Repeat every 1 years.  Lung Cancer Screening: (Low Dose CT Chest recommended if Age 27-80 years, 30 pack-year currently smoking OR have quit w/in 15years.) does not qualify.   Lung Cancer Screening Referral: no  Additional Screening:  Hepatitis C Screening: does qualify; Completed yes  Vision Screening: Recommended annual ophthalmology exams for early detection of glaucoma and other disorders of the eye. Is the patient up to date with their annual eye exam?  Yes  Who is the provider or what is the name of the office in which the patient attends annual eye exams? Warden Fillers, MD. If pt is not established with a provider, would they like to be referred to a provider to establish care? No .   Dental Screening: Recommended annual dental exams for proper oral hygiene  Community Resource Referral / Chronic Care Management: CRR required this visit?  No   CCM required this visit?  No      Plan:     I have personally reviewed and noted the following in the patient's chart:   . Medical and social history . Use of alcohol, tobacco or illicit drugs  . Current medications and supplements . Functional ability and status . Nutritional status . Physical activity . Advanced directives . List of other physicians . Hospitalizations, surgeries, and ER visits in previous 12 months . Vitals . Screenings to include cognitive, depression, and falls . Referrals and appointments  In addition, I have reviewed and discussed with patient certain preventive protocols, quality metrics, and best practice recommendations. A written personalized care plan for preventive services as well as general preventive health recommendations were  provided to patient.     Sheral Flow, LPN   12/31/238   Nurse Notes: n/a

## 2020-08-02 NOTE — Patient Instructions (Signed)
Pamela Terry , Thank you for taking time to come for your Medicare Wellness Visit. I appreciate your ongoing commitment to your health goals. Please review the following plan we discussed and let me know if I can assist you in the future.   Screening recommendations/referrals: Colonoscopy: 07/19/2019; due every 5 years Mammogram: scheduled for 03/2021 per patient Bone Density: 08/26/2017; due every 2 years; per patient will schedule with mammogram 03/2021 Recommended yearly ophthalmology/optometry visit for glaucoma screening and checkup Recommended yearly dental visit for hygiene and checkup  Vaccinations: Influenza vaccine: 03/24/2020 Pneumococcal vaccine: 05/18/2012, 05/14/2016 Tdap vaccine: 05/20/2011 Shingles vaccine: never done   Covid-19: 07/30/2019, 08/24/2019, 03/24/2020  Advanced directives: Advance directive discussed with you today. Even though you declined this today please call our office should you change your mind and we can give you the proper paperwork for you to fill out.  Conditions/risks identified: Yes; Reviewed health maintenance screenings with patient today and relevant education, vaccines, and/or referrals were provided. Please continue to do your personal lifestyle choices by: daily care of teeth and gums, regular physical activity (goal should be 5 days a week for 30 minutes), eat a healthy diet, avoid tobacco and drug use, limiting any alcohol intake, taking a low-dose aspirin (if not allergic or have been advised by your provider otherwise) and taking vitamins and minerals as recommended by your provider. Continue doing brain stimulating activities (puzzles, reading, adult coloring books, staying active) to keep memory sharp. Continue to eat heart healthy diet (full of fruits, vegetables, whole grains, lean protein, water--limit salt, fat, and sugar intake) and increase physical activity as tolerated.  Next appointment: Please schedule your next Medicare Wellness Visit with  your Nurse Health Advisor in 1 year by calling [phone].  Preventive Care 30 Years and Older, Female Preventive care refers to lifestyle choices and visits with your health care provider that can promote health and wellness. What does preventive care include?  A yearly physical exam. This is also called an annual well check.  Dental exams once or twice a year.  Routine eye exams. Ask your health care provider how often you should have your eyes checked.  Personal lifestyle choices, including:  Daily care of your teeth and gums.  Regular physical activity.  Eating a healthy diet.  Avoiding tobacco and drug use.  Limiting alcohol use.  Practicing safe sex.  Taking low-dose aspirin every day.  Taking vitamin and mineral supplements as recommended by your health care provider. What happens during an annual well check? The services and screenings done by your health care provider during your annual well check will depend on your age, overall health, lifestyle risk factors, and family history of disease. Counseling  Your health care provider may ask you questions about your:  Alcohol use.  Tobacco use.  Drug use.  Emotional well-being.  Home and relationship well-being.  Sexual activity.  Eating habits.  History of falls.  Memory and ability to understand (cognition).  Work and work Statistician.  Reproductive health. Screening  You may have the following tests or measurements:  Height, weight, and BMI.  Blood pressure.  Lipid and cholesterol levels. These may be checked every 5 years, or more frequently if you are over 23 years old.  Skin check.  Lung cancer screening. You may have this screening every year starting at age 29 if you have a 30-pack-year history of smoking and currently smoke or have quit within the past 15 years.  Fecal occult blood test (FOBT) of the stool.  You may have this test every year starting at age 28.  Flexible  sigmoidoscopy or colonoscopy. You may have a sigmoidoscopy every 5 years or a colonoscopy every 10 years starting at age 31.  Hepatitis C blood test.  Hepatitis B blood test.  Sexually transmitted disease (STD) testing.  Diabetes screening. This is done by checking your blood sugar (glucose) after you have not eaten for a while (fasting). You may have this done every 1-3 years.  Bone density scan. This is done to screen for osteoporosis. You may have this done starting at age 56.  Mammogram. This may be done every 1-2 years. Talk to your health care provider about how often you should have regular mammograms. Talk with your health care provider about your test results, treatment options, and if necessary, the need for more tests. Vaccines  Your health care provider may recommend certain vaccines, such as:  Influenza vaccine. This is recommended every year.  Tetanus, diphtheria, and acellular pertussis (Tdap, Td) vaccine. You may need a Td booster every 10 years.  Zoster vaccine. You may need this after age 48.  Pneumococcal 13-valent conjugate (PCV13) vaccine. One dose is recommended after age 55.  Pneumococcal polysaccharide (PPSV23) vaccine. One dose is recommended after age 77. Talk to your health care provider about which screenings and vaccines you need and how often you need them. This information is not intended to replace advice given to you by your health care provider. Make sure you discuss any questions you have with your health care provider. Document Released: 07/07/2015 Document Revised: 02/28/2016 Document Reviewed: 04/11/2015 Elsevier Interactive Patient Education  2017 [employer] Prevention in the Home Falls can cause injuries. They can happen to people of all ages. There are many things you can do to make your home safe and to help prevent falls. What can I do on the outside of my home?  Regularly fix the edges of walkways and driveways and fix any  cracks.  Remove anything that might make you trip as you walk through a door, such as a raised step or threshold.  Trim any bushes or trees on the path to your home.  Use bright outdoor lighting.  Clear any walking paths of anything that might make someone trip, such as rocks or tools.  Regularly check to see if handrails are loose or broken. Make sure that both sides of any steps have handrails.  Any raised decks and porches should have guardrails on the edges.  Have any leaves, snow, or ice cleared regularly.  Use sand or salt on walking paths during winter.  Clean up any spills in your garage right away. This includes oil or grease spills. What can I do in the bathroom?  Use night lights.  Install grab bars by the toilet and in the tub and shower. Do not use towel bars as grab bars.  Use non-skid mats or decals in the tub or shower.  If you need to sit down in the shower, use a plastic, non-slip stool.  Keep the floor dry. Clean up any water that spills on the floor as soon as it happens.  Remove soap buildup in the tub or shower regularly.  Attach bath mats securely with double-sided non-slip rug tape.  Do not have throw rugs and other things on the floor that can make you trip. What can I do in the bedroom?  Use night lights.  Make sure that you have a light by your bed that  is easy to reach.  Do not use any sheets or blankets that are too big for your bed. They should not hang down onto the floor.  Have a firm chair that has side arms. You can use this for support while you get dressed.  Do not have throw rugs and other things on the floor that can make you trip. What can I do in the kitchen?  Clean up any spills right away.  Avoid walking on wet floors.  Keep items that you use a lot in easy-to-reach places.  If you need to reach something above you, use a strong step stool that has a grab bar.  Keep electrical cords out of the way.  Do not use floor  polish or wax that makes floors slippery. If you must use wax, use non-skid floor wax.  Do not have throw rugs and other things on the floor that can make you trip. What can I do with my stairs?  Do not leave any items on the stairs.  Make sure that there are handrails on both sides of the stairs and use them. Fix handrails that are broken or loose. Make sure that handrails are as long as the stairways.  Check any carpeting to make sure that it is firmly attached to the stairs. Fix any carpet that is loose or worn.  Avoid having throw rugs at the top or bottom of the stairs. If you do have throw rugs, attach them to the floor with carpet tape.  Make sure that you have a light switch at the top of the stairs and the bottom of the stairs. If you do not have them, ask someone to add them for you. What else can I do to help prevent falls?  Wear shoes that:  Do not have high heels.  Have rubber bottoms.  Are comfortable and fit you well.  Are closed at the toe. Do not wear sandals.  If you use a stepladder:  Make sure that it is fully opened. Do not climb a closed stepladder.  Make sure that both sides of the stepladder are locked into place.  Ask someone to hold it for you, if possible.  Clearly mark and make sure that you can see:  Any grab bars or handrails.  First and last steps.  Where the edge of each step is.  Use tools that help you move around (mobility aids) if they are needed. These include:  Canes.  Walkers.  Scooters.  Crutches.  Turn on the lights when you go into a dark area. Replace any light bulbs as soon as they burn out.  Set up your furniture so you have a clear path. Avoid moving your furniture around.  If any of your floors are uneven, fix them.  If there are any pets around you, be aware of where they are.  Review your medicines with your doctor. Some medicines can make you feel dizzy. This can increase your chance of falling. Ask your  doctor what other things that you can do to help prevent falls. This information is not intended to replace advice given to you by your health care provider. Make sure you discuss any questions you have with your health care provider. Document Released: 04/06/2009 Document Revised: 11/16/2015 Document Reviewed: 07/15/2014 Elsevier Interactive Patient Education  2017 [employer].

## 2020-08-02 NOTE — Assessment & Plan Note (Signed)
Chronic stable persistent,  to f/u any worsening symptoms or concerns, for UA toay

## 2020-08-03 ENCOUNTER — Telehealth: Payer: Self-pay | Admitting: Internal Medicine

## 2020-08-03 ENCOUNTER — Encounter: Payer: Self-pay | Admitting: Internal Medicine

## 2020-08-03 ENCOUNTER — Telehealth: Payer: Self-pay

## 2020-08-03 NOTE — Telephone Encounter (Signed)
Pt  Notified    No, I dont think so unless she wants to, but there was no significant acute or any other problem found on the most recent xray

## 2020-08-03 NOTE — Telephone Encounter (Signed)
No, I dont think so unless she wants to, but there was no significant acute or any other problem found on the most recent xray

## 2020-08-03 NOTE — Telephone Encounter (Signed)
Patient is calling about her xray, states she read something she doesn't understand and would like to speak with someone

## 2020-08-09 ENCOUNTER — Encounter: Payer: Self-pay | Admitting: Internal Medicine

## 2020-08-09 NOTE — Assessment & Plan Note (Signed)
Age and sex appropriate education and counseling updated with regular exercise and diet Referrals for preventative services - for mammogram per pt in mar 2022 Immunizations addressed - none needed Smoking counseling  - none needed Evidence for depression or other mood disorder - none significant Most recent labs reviewed. I have personally reviewed and have noted: 1) the patient's medical and social history 2) The patient's current medications and supplements 3) The patient's height, weight, and BMI have been recorded in the chart

## 2020-08-09 NOTE — Assessment & Plan Note (Signed)
Overall stable, for cxr, cont current med tx - airduo, singulair  Current Outpatient Medications (Endocrine & Metabolic):  .  alendronate (FOSAMAX) 70 MG tablet, TAKE 1 TABLET BY MOUTH ONCE A WEEK. TAKE WITH A FULL GLASS OF WATER ON AN EMPTY STOMACH.  Current Outpatient Medications (Cardiovascular):  .  atorvastatin (LIPITOR) 10 MG tablet, Take 1 tablet (10 mg total) by mouth daily.  Current Outpatient Medications (Respiratory):  Marland Kitchen  Fluticasone-Salmeterol (AIRDUO RESPICLICK 220/25) 427-06 MCG/ACT AEPB, Inhale 2 puffs into the lungs 2 (two) times daily. .  fluticasone (FLONASE) 50 MCG/ACT nasal spray, Place 2 sprays into both nostrils daily. .  montelukast (SINGULAIR) 10 MG tablet, TAKE 1 TABLET BY MOUTH EVERYDAY AT BEDTIME  Current Outpatient Medications (Analgesics):  .  aspirin 81 MG tablet, Take 81 mg by mouth daily.  Current Outpatient Medications (Hematological):  .  vitamin B-12 (CYANOCOBALAMIN) 1000 MCG tablet, Take 1 tablet (1,000 mcg total) by mouth daily.  Current Outpatient Medications (Other):  .  Apoaequorin (PREVAGEN) 10 MG CAPS, Take 20 mg by mouth daily. .  calcium-vitamin D (OSCAL WITH D) 500-200 MG-UNIT tablet, Take 1 tablet by mouth daily. .  Cholecalciferol (VITAMIN D) 2000 UNITS CAPS, Take 1 capsule by mouth daily. Marland Kitchen  LORazepam (ATIVAN) 1 MG tablet, Take 1 tablet (1 mg total) by mouth at bedtime as needed.

## 2020-08-09 NOTE — Assessment & Plan Note (Signed)
Lab Results  Component Value Date   [id number] 411 08/02/2020   Stable, cont oral replacement - b12 1000 mcg qd

## 2020-08-09 NOTE — Assessment & Plan Note (Signed)
appaers c/w benign forgetfulness, cont b12, consider MRI for any worsening

## 2020-08-09 NOTE — Assessment & Plan Note (Signed)
Lab Results  Component Value Date   LDLCALC 77 08/02/2020   Stable, pt to continue current statin lipitor 10

## 2020-08-16 DIAGNOSIS — H02831 Dermatochalasis of right upper eyelid: Secondary | ICD-10-CM | POA: Diagnosis not present

## 2020-08-16 DIAGNOSIS — D3131 Benign neoplasm of right choroid: Secondary | ICD-10-CM | POA: Diagnosis not present

## 2020-08-16 DIAGNOSIS — H02834 Dermatochalasis of left upper eyelid: Secondary | ICD-10-CM | POA: Diagnosis not present

## 2020-08-16 DIAGNOSIS — Z961 Presence of intraocular lens: Secondary | ICD-10-CM | POA: Diagnosis not present

## 2020-08-16 DIAGNOSIS — H16223 Keratoconjunctivitis sicca, not specified as Sjogren's, bilateral: Secondary | ICD-10-CM | POA: Diagnosis not present

## 2020-08-19 ENCOUNTER — Other Ambulatory Visit: Payer: Self-pay | Admitting: Internal Medicine

## 2020-09-16 ENCOUNTER — Other Ambulatory Visit: Payer: Self-pay | Admitting: Internal Medicine

## 2020-09-16 NOTE — Telephone Encounter (Signed)
Please refill as per office routine med refill policy (all routine meds refilled for 3 mo or monthly per pt preference up to one year from last visit, then month to month grace period for 3 mo, then further med refills will have to be denied)  

## 2020-12-12 ENCOUNTER — Telehealth: Payer: Self-pay | Admitting: Internal Medicine

## 2020-12-12 MED ORDER — LORAZEPAM 1 MG PO TABS: 1.0000 mg | ORAL_TABLET | Freq: Every evening | ORAL | 1 refills | Status: DC | PRN

## 2020-12-12 NOTE — Telephone Encounter (Signed)
   Patient requesting refill for LORazepam (ATIVAN) 1 MG tablet  Pharmacy [id number] - [place], [address]

## 2021-02-23 ENCOUNTER — Encounter: Payer: Self-pay | Admitting: Internal Medicine

## 2021-02-23 ENCOUNTER — Ambulatory Visit (INDEPENDENT_AMBULATORY_CARE_PROVIDER_SITE_OTHER): Payer: Medicare Other | Admitting: Internal Medicine

## 2021-02-23 ENCOUNTER — Other Ambulatory Visit: Payer: Self-pay

## 2021-02-23 VITALS — BP 140/82 | HR 58 | Temp 98.6°F | Ht <= 58 in | Wt 118.0 lb

## 2021-02-23 DIAGNOSIS — F419 Anxiety disorder, unspecified: Secondary | ICD-10-CM | POA: Diagnosis not present

## 2021-02-23 DIAGNOSIS — R413 Other amnesia: Secondary | ICD-10-CM | POA: Diagnosis not present

## 2021-02-23 DIAGNOSIS — F32A Depression, unspecified: Secondary | ICD-10-CM

## 2021-02-23 DIAGNOSIS — G309 Alzheimer's disease, unspecified: Secondary | ICD-10-CM | POA: Insufficient documentation

## 2021-02-23 DIAGNOSIS — F0390 Unspecified dementia without behavioral disturbance: Secondary | ICD-10-CM | POA: Insufficient documentation

## 2021-02-23 DIAGNOSIS — J449 Chronic obstructive pulmonary disease, unspecified: Secondary | ICD-10-CM

## 2021-02-23 DIAGNOSIS — E538 Deficiency of other specified B group vitamins: Secondary | ICD-10-CM | POA: Diagnosis not present

## 2021-02-23 MED ORDER — CITALOPRAM HYDROBROMIDE 10 MG PO TABS: 10.0000 mg | ORAL_TABLET | Freq: Every day | ORAL | 3 refills | Status: DC

## 2021-02-23 NOTE — Patient Instructions (Signed)
Please take all new medication as prescribed - the celexa 10 mg per day  Please continue all other medications as before, and refills have been done if requested.  Please have the pharmacy call with any other refills you may need.  Please continue your efforts at being more active, low cholesterol diet, and weight control.  Please keep your appointments with your specialists as you may have planned  You will be contacted regarding the referral for: MRI, and Neurology  Please make an Appointment to return in 6 months, or sooner if needed

## 2021-02-23 NOTE — Progress Notes (Signed)
Patient ID: Pamela Terry, female   DOB: June 23, 1946, 75 y.o.   MRN: [id number]        Chief Complaint: follow up recent onset worsening memory loss, anxiety/depression       HPI:  Pamela Terry is a 75 y.o. female here with daughter who relates worsening 6 mo or more gradual but rapidly worsening memory loss, repeats questions, forgetful, gets lost driving, cant find words she has known, appears more shaky and also worsening anxiety and depressive mood though no SI or HI.  Pt denies chest pain, increased sob or doe, wheezing, orthopnea, PND, increased LE swelling, palpitations, dizziness or syncope.   Pt denies polydipsia, polyuria, or new focal neuro s/s.   Pt denies fever, wt loss, night sweats, loss of appetite, or other constitutional symptoms         Wt Readings from Last 3 Encounters:  02/23/21 118 lb (53.5 kg)  08/02/20 124 lb 6.4 oz (56.4 kg)  08/02/20 124 lb 6.4 oz (56.4 kg)   BP Readings from Last 3 Encounters:  02/23/21 140/82  08/02/20 120/60  08/02/20 120/60         Past Medical History:  Diagnosis Date   Acute asthmatic bronchitis    Adenocarcinoma, lung (HCC)    Allergic rhinitis    Anxiety    Carcinoma in situ of endocrine gland    Colon polyp    COPD (chronic obstructive pulmonary disease) (HCC)    Diverticulosis of colon    History of cervical cancer    Hypercholesterolemia    Microscopic hematuria    Osteoporosis    Past Surgical History:  Procedure Laterality Date   ABDOMINAL HYSTERECTOMY     in her 30's   APPENDECTOMY     TOTAL ABDOMINAL HYSTERECTOMY W/ BILATERAL SALPINGOOPHORECTOMY  1985   Dr Ree Edman for Ca of cervix    reports that she quit smoking about 35 years ago. Her smoking use included cigarettes. She has a 46.00 pack-year smoking history. She has never used smokeless tobacco. She reports that she does not drink alcohol and does not use drugs. family history includes Asthma in her maternal aunt; Breast cancer in her paternal grandmother;  Diabetes in her brother; Heart disease in her father, maternal grandmother, and paternal grandfather; Hyperlipidemia in her father; Prostate cancer in her maternal grandfather; Rheum arthritis in her mother; Stroke in her father. Allergies  Allergen Reactions   Augmentin [Amoxicillin-Pot Clavulanate]     Severe diarrhea   Sulfonamide Derivatives Hives and Rash   Current Outpatient Medications on File Prior to Visit  Medication Sig Dispense Refill   alendronate (FOSAMAX) 70 MG tablet TAKE 1 TABLET BY MOUTH ONCE A WEEK. TAKE WITH A FULL GLASS OF WATER ON AN EMPTY STOMACH. 12 tablet 3   Apoaequorin (PREVAGEN) 10 MG CAPS Take 20 mg by mouth daily.     aspirin 81 MG tablet Take 81 mg by mouth daily.     atorvastatin (LIPITOR) 10 MG tablet TAKE 1 TABLET BY MOUTH EVERY DAY 90 tablet 3   calcium-vitamin D (OSCAL WITH D) 500-200 MG-UNIT tablet Take 1 tablet by mouth daily.     Cholecalciferol (VITAMIN D) 2000 UNITS CAPS Take 1 capsule by mouth daily.     fluticasone (FLONASE) 50 MCG/ACT nasal spray SPRAY 2 SPRAYS INTO EACH NOSTRIL EVERY DAY 48 mL 3   LORazepam (ATIVAN) 1 MG tablet Take 1 tablet (1 mg total) by mouth at bedtime as needed. 90 tablet 1   montelukast (SINGULAIR)  10 MG tablet TAKE 1 TABLET BY MOUTH EVERYDAY AT BEDTIME 90 tablet 3   vitamin B-12 (CYANOCOBALAMIN) 1000 MCG tablet Take 1 tablet (1,000 mcg total) by mouth daily. 90 tablet 1   Fluticasone-Salmeterol (AIRDUO RESPICLICK 774/12) 878-67 MCG/ACT AEPB Inhale 2 puffs into the lungs 2 (two) times daily. (Patient not taking: Reported on 02/23/2021) 3 each 3   No current facility-administered medications on file prior to visit.        ROS:  All others reviewed and negative.  Objective        PE:  BP 140/82 (BP Location: Left Arm, Patient Position: Sitting, Cuff Size: Normal)   Pulse (!) 58   Temp 98.6 F (37 C) (Oral)   Ht 4\' 10"  (1.473 m)   Wt 118 lb (53.5 kg)   SpO2 99%   BMI 24.66 kg/m                 Constitutional: Pt  appears in NAD               HENT: Head: NCAT.                Right Ear: External ear normal.                 Left Ear: External ear normal.                Eyes: . Pupils are equal, round, and reactive to light. Conjunctivae and EOM are normal               Nose: without d/c or deformity               Neck: Neck supple. Gross normal ROM               Cardiovascular: Normal rate and regular rhythm.                 Pulmonary/Chest: Effort normal and breath sounds without rales or wheezing.                Abd:  Soft, NT, ND, + BS, no organomegaly               Neurological: Pt is alert. At baseline orientation, motor grossly intact               Skin: Skin is warm. No rashes, no other new lesions, LE edema - none               Psychiatric: Pt behavior is normal without agitation , anxious depressed  Micro: none  Cardiac tracings I have personally interpreted today:  none  Pertinent Radiological findings (summarize): none   Lab Results  Component Value Date   WBC 9.1 08/02/2020   HGB 14.8 08/02/2020   HCT 44.1 08/02/2020   PLT 302.0 08/02/2020   GLUCOSE 83 08/02/2020   CHOL 159 08/02/2020   TRIG 83.0 08/02/2020   HDL 64.90 08/02/2020   LDLCALC 77 08/02/2020   ALT 23 08/02/2020   AST 23 08/02/2020   NA 141 08/02/2020   K 4.2 08/02/2020   CL 104 08/02/2020   CREATININE 0.84 08/02/2020   BUN 10 08/02/2020   CO2 30 08/02/2020   TSH 1.17 08/02/2020   Assessment/Plan:  Pamela Terry is a 75 y.o. White or Caucasian [1] female with  has a past medical history of Acute asthmatic bronchitis, Adenocarcinoma, lung ([place]), Allergic rhinitis, Anxiety, Carcinoma in situ of endocrine gland, Colon polyp, COPD (  chronic obstructive pulmonary disease) ([place]), Diverticulosis of colon, History of cervical cancer, Hypercholesterolemia, Microscopic hematuria, and Osteoporosis.  Memory loss ? Pseudodementia vs other dementia - for MRI brain, and refer neurology  B12 deficiency Lab Results   Component Value Date   VITAMINB12 679 08/02/2020   Stable, cont oral replacement - b12 1000 mcg qd   Anxiety and depression With recent worsening uncontrolled, no SI or HI, for celexa 10 qd, declines counseling referral  COPD (chronic obstructive pulmonary disease) with chronic bronchitis (HCC) Stable overall, to continue current med tx - airduo respiclick, singulair  Followup: Return in about 6 months (around 08/23/2021).  Cathlean Cower, MD 02/26/2021 [facility]

## 2021-02-26 ENCOUNTER — Encounter: Payer: Self-pay | Admitting: Internal Medicine

## 2021-02-26 NOTE — Assessment & Plan Note (Signed)
?   Pseudodementia vs other dementia - for MRI brain, and refer neurology

## 2021-02-26 NOTE — Assessment & Plan Note (Signed)
Stable overall, to continue current med tx - airduo respiclick, singulair

## 2021-02-26 NOTE — Assessment & Plan Note (Signed)
With recent worsening uncontrolled, no SI or HI, for celexa 10 qd, declines counseling referral

## 2021-02-26 NOTE — Assessment & Plan Note (Signed)
Lab Results  Component Value Date   [id number] 125 08/02/2020   Stable, cont oral replacement - b12 1000 mcg qd

## 2021-02-27 ENCOUNTER — Encounter: Payer: Self-pay | Admitting: Physician Assistant

## 2021-03-11 ENCOUNTER — Other Ambulatory Visit: Payer: Self-pay

## 2021-03-11 ENCOUNTER — Ambulatory Visit
Admission: RE | Admit: 2021-03-11 | Discharge: 2021-03-11 | Disposition: A | Discharge status: Home / Self Care | Payer: Medicare Other | Source: Ambulatory Visit | Attending: Internal Medicine | Admitting: Internal Medicine

## 2021-03-11 DIAGNOSIS — R413 Other amnesia: Secondary | ICD-10-CM

## 2021-03-11 DIAGNOSIS — R251 Tremor, unspecified: Secondary | ICD-10-CM | POA: Diagnosis not present

## 2021-03-12 ENCOUNTER — Telehealth: Payer: Self-pay

## 2021-03-12 NOTE — Telephone Encounter (Signed)
Please advise as the pt has stated her MRI results were released on MyChart and she does not understand them. She is asking that her PCP or his nurse please call her back with an explanation of these results.

## 2021-03-12 NOTE — Telephone Encounter (Signed)
Not sure about this and will need to address soon, but I am out of town and still trying also to get the computer documentation done as required by my employer for the remaining 40 visits for which I am currently delinquent

## 2021-03-14 ENCOUNTER — Encounter: Payer: Self-pay | Admitting: Internal Medicine

## 2021-03-14 ENCOUNTER — Other Ambulatory Visit: Payer: Self-pay | Admitting: Internal Medicine

## 2021-03-14 DIAGNOSIS — R9089 Other abnormal findings on diagnostic imaging of central nervous system: Secondary | ICD-10-CM

## 2021-03-14 DIAGNOSIS — C3490 Malignant neoplasm of unspecified part of unspecified bronchus or lung: Secondary | ICD-10-CM

## 2021-03-26 ENCOUNTER — Encounter: Payer: Self-pay | Admitting: Internal Medicine

## 2021-03-30 ENCOUNTER — Encounter: Payer: Self-pay | Admitting: Internal Medicine

## 2021-03-30 ENCOUNTER — Ambulatory Visit: Payer: Medicare Other | Admitting: Physician Assistant

## 2021-03-30 ENCOUNTER — Encounter: Payer: Self-pay | Admitting: Physician Assistant

## 2021-03-30 ENCOUNTER — Telehealth: Payer: Self-pay | Admitting: Physician Assistant

## 2021-03-30 ENCOUNTER — Other Ambulatory Visit: Payer: Self-pay | Admitting: Physician Assistant

## 2021-03-30 ENCOUNTER — Other Ambulatory Visit: Payer: Self-pay

## 2021-03-30 VITALS — BP 207/81 | HR 78 | Resp 20 | Ht <= 58 in | Wt 118.0 lb

## 2021-03-30 DIAGNOSIS — F419 Anxiety disorder, unspecified: Secondary | ICD-10-CM

## 2021-03-30 DIAGNOSIS — R413 Other amnesia: Secondary | ICD-10-CM | POA: Diagnosis not present

## 2021-03-30 MED ORDER — RIVASTIGMINE TARTRATE 1.5 MG PO CAPS: ORAL_CAPSULE | ORAL | 11 refills | Status: DC

## 2021-03-30 MED ORDER — DONEPEZIL HCL 10 MG PO TABS: ORAL_TABLET | ORAL | 11 refills | Status: DC

## 2021-03-30 NOTE — Progress Notes (Signed)
Spoke with pharmacy, who warned about significant interaction between Aricept and Celexa in this patient, will start rivastigmine instead,Start with 1 capsule 1.5 mg at night for 2 weeks, then increase to 1 tab  1.5 mg twice daily, side effects discussed

## 2021-03-30 NOTE — Patient Instructions (Addendum)
It was a pleasure to see you today at our office.   Recommendations:  We will start donepezil half tablet (5mg ) daily for 2  weeks.  If you are tolerating the medication, then after 2 weeks, we will increase the dose to a full tablet of 10 mg daily for Vascular Dementia.  Side effects include nausea, vomiting, diarrhea, vivid dreams, and muscle cramps.   Follow up in 6 months  Please follow up with pulmonary and family doctor for the other medical issues   RECOMMENDATIONS FOR ALL PATIENTS WITH MEMORY PROBLEMS: 1. Continue to exercise (Recommend 30 minutes of walking everyday, or 3 hours every week) 2. Increase social interactions - continue going to [place] and enjoy social gatherings with friends and family 3. Eat healthy, avoid fried foods and eat more fruits and vegetables 4. Maintain adequate blood pressure, blood sugar, and blood cholesterol level. Reducing the risk of stroke and cardiovascular disease also helps promoting better memory. 5. Avoid stressful situations. Live a simple life and avoid aggravations. Organize your time and prepare for the next day in anticipation. 6. Sleep well, avoid any interruptions of sleep and avoid any distractions in the bedroom that may interfere with adequate sleep quality 7. Avoid sugar, avoid sweets as there is a strong link between excessive sugar intake, diabetes, and cognitive impairment We discussed the Mediterranean diet, which has been shown to help patients reduce the risk of progressive memory disorders and reduces cardiovascular risk. This includes eating fish, eat fruits and green leafy vegetables, nuts like almonds and hazelnuts, walnuts, and also use olive oil. Avoid fast foods and fried foods as much as possible. Avoid sweets and sugar as sugar use has been linked to worsening of memory function.  There is always a concern of gradual progression of memory problems. If this is the case, then we may need to adjust level of care according to  patient needs. Support, both to the patient and caregiver, should then be put into place.   FALL PRECAUTIONS: Be cautious when walking. Scan the area for obstacles that may increase the risk of trips and falls. When getting up in the mornings, sit up at the edge of the bed for a few minutes before getting out of bed. Consider elevating the bed at the head end to avoid drop of blood pressure when getting up. Walk always in a well-lit room (use night lights in the walls). Avoid area rugs or power cords from appliances in the middle of the walkways. Use a walker or a cane if necessary and consider physical therapy for balance exercise. Get your eyesight checked regularly.  FINANCIAL OVERSIGHT: Supervision, especially oversight when making financial decisions or transactions is also recommended.  HOME SAFETY: Consider the safety of the kitchen when operating appliances like stoves, microwave oven, and blender. Consider having supervision and share cooking responsibilities until no longer able to participate in those. Accidents with firearms and other hazards in the house should be identified and addressed as well.   ABILITY TO BE LEFT ALONE: If patient is unable to contact 911 operator, consider using LifeLine, or when the need is there, arrange for someone to stay with patients. Smoking is a fire hazard, consider supervision or cessation. Risk of wandering should be assessed by caregiver and if detected at any point, supervision and safe proof recommendations should be instituted.  MEDICATION SUPERVISION: Inability to self-administer medication needs to be constantly addressed. Implement a mechanism to ensure safe administration of the medications.   DRIVING: Regarding  driving, in patients with progressive memory problems, driving will be impaired. We advise to have someone else do the driving if trouble finding directions or if minor accidents are reported. Independent driving assessment is available to  determine safety of driving.   If you are interested in the driving assessment, you can contact the following:  [employer] in [place]  [place] [phone] or [phone]    [place] refers to food and lifestyle choices that are based on the traditions of countries located on the [employer]. This way of eating has been shown to help prevent certain conditions and improve outcomes for people who have chronic diseases, like kidney disease and heart disease. What are tips for following this plan? Lifestyle  Cook and eat meals together with your family, when possible. Drink enough fluid to keep your urine clear or pale yellow. Be physically active every day. This includes: Aerobic exercise like running or swimming. Leisure activities like gardening, walking, or housework. Get 7-8 hours of sleep each night. If recommended by your health care provider, drink red wine in moderation. This means 1 glass a day for nonpregnant women and 2 glasses a day for men. A glass of wine equals 5 oz (150 mL). Reading food labels  Check the serving size of packaged foods. For foods such as rice and pasta, the serving size refers to the amount of cooked product, not dry. Check the total fat in packaged foods. Avoid foods that have saturated fat or trans fats. Check the ingredients list for added sugars, such as corn syrup. Shopping  At the grocery store, buy most of your food from the areas near the walls of the store. This includes: Fresh fruits and vegetables (produce). Grains, beans, nuts, and seeds. Some of these may be available in unpackaged forms or large amounts (in bulk). Fresh seafood. Poultry and eggs. Low-fat dairy products. Buy whole ingredients instead of prepackaged foods. Buy fresh fruits and vegetables in-season from local  farmers markets. Buy frozen fruits and vegetables in resealable bags. If you do not have access to quality fresh seafood, buy precooked frozen shrimp or canned fish, such as tuna, salmon, or sardines. Buy small amounts of raw or cooked vegetables, salads, or olives from the deli or salad bar at your store. Stock your pantry so you always have certain foods on hand, such as olive oil, canned tuna, canned tomatoes, rice, pasta, and beans. Cooking  Cook foods with extra-virgin olive oil instead of using butter or other vegetable oils. Have meat as a side dish, and have vegetables or grains as your main dish. This means having meat in small portions or adding small amounts of meat to foods like pasta or stew. Use beans or vegetables instead of meat in common dishes like chili or lasagna. Experiment with different cooking methods. Try roasting or broiling vegetables instead of steaming or sauteing them. Add frozen vegetables to soups, stews, pasta, or rice. Add nuts or seeds for added healthy fat at each meal. You can add these to yogurt, salads, or vegetable dishes. Marinate fish or vegetables using olive oil, lemon juice, garlic, and fresh herbs. Meal planning  Plan to eat 1 vegetarian meal one day each week. Try to work up to 2 vegetarian meals, if possible. Eat seafood 2 or more times a week. Have healthy snacks readily available, such as: Vegetable sticks with hummus. Greek yogurt. Fruit and  nut trail mix. Eat balanced meals throughout the week. This includes: Fruit: 2-3 servings a day Vegetables: 4-5 servings a day Low-fat dairy: 2 servings a day Fish, poultry, or lean meat: 1 serving a day Beans and legumes: 2 or more servings a week Nuts and seeds: 1-2 servings a day Whole grains: 6-8 servings a day Extra-virgin olive oil: 3-4 servings a day Limit red meat and sweets to only a few servings a month What are my food choices? Mediterranean diet Recommended Grains: Whole-grain pasta.  Brown rice. Bulgar wheat. Polenta. Couscous. Whole-wheat bread. [place]. Vegetables: Artichokes. Beets. Broccoli. Cabbage. Carrots. Eggplant. Green beans. Chard. Kale. Spinach. Onions. Leeks. Peas. Squash. Tomatoes. Peppers. Radishes. Fruits: Apples. Apricots. Avocado. Berries. Bananas. Cherries. Dates. Figs. Grapes. Lemons. Melon. Oranges. Peaches. Plums. Pomegranate. Meats and other protein foods: Beans. Almonds. Sunflower seeds. Pine nuts. Peanuts. [place]. Salmon. Scallops. Shrimp. [place]. Tilapia. Clams. Oysters. Eggs. Dairy: Low-fat milk. Cheese. Greek yogurt. Beverages: Water. Red wine. Herbal tea. Fats and oils: Extra virgin olive oil. Avocado oil. Grape seed oil. Sweets and desserts: [place] yogurt with honey. Baked apples. Poached pears. Trail mix. Seasoning and other foods: Basil. Cilantro. Coriander. Cumin. Mint. Parsley. Sage. Rosemary. Tarragon. Garlic. Oregano. Thyme. Pepper. Balsalmic vinegar. Tahini. Hummus. Tomato sauce. Olives. Mushrooms. Limit these Grains: Prepackaged pasta or rice dishes. Prepackaged cereal with added sugar. Vegetables: Deep fried potatoes (french fries). Fruits: Fruit canned in syrup. Meats and other protein foods: Beef. Pork. Lamb. Poultry with skin. Hot dogs. [place] Salines. Dairy: Ice cream. Sour cream. Whole milk. Beverages: Juice. Sugar-sweetened soft drinks. Beer. Liquor and spirits. Fats and oils: Butter. Canola oil. Vegetable oil. Beef fat (tallow). Lard. Sweets and desserts: Cookies. Cakes. Pies. Candy. Seasoning and other foods: Mayonnaise. Premade sauces and marinades. The items listed may not be a complete list. Talk with your dietitian about what dietary choices are right for you. Summary The Mediterranean diet includes both food and lifestyle choices. Eat a variety of fresh fruits and vegetables, beans, nuts, seeds, and whole grains. Limit the amount of red meat and sweets that you eat. Talk with your health care provider about whether it is safe  for you to drink red wine in moderation. This means 1 glass a day for nonpregnant women and 2 glasses a day for men. A glass of wine equals 5 oz (150 mL). This information is not intended to replace advice given to you by your health care provider. Make sure you discuss any questions you have with your health care provider. Document Released: 02/01/2016 Document Revised: 03/05/2016 Document Reviewed: 02/01/2016 Elsevier Interactive Patient Education  2017 [employer].

## 2021-03-30 NOTE — Progress Notes (Addendum)
Assessment/Plan:   Pamela Terry is a 75 y.o. 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 MoCA today is 18/30, with deficiencies in visuospatial executive, 0/5, naming, attention, language, abstraction, orientation 5/6, suggestive of vascular etiology in addition to her history of anxiety and depression.  MRI of the brain on 03/11/2021 shows scattered foci of susceptibility throughout the bilateral cerebral hemispheres and right cerebellar hemisphere, nonspecific but can be seen in setting of cerebral amyloid angiopathy. In addition osseous metastatic disease in the setting of a history of lung cancer but could also represent a focus of fibrous dysplasia. Mildly advanced cerebral volume loss for age, with sequela of severe chronic small vessel ischemic disease in the periventricular white matter and pons was noted.    Recommendations:   Dementia likely mixed vascular and AD  Due to interaction with Celexa, pharmacy flagged Donepezil. Will start rivastigmine 1.5 mg twice daily, initially once nightly for 2 weeks, then increasing to twice daily, side effects discussed Continue Celexa by PCP Recommend behavioral counseling for anxiety and depression Follow-up with PCP for further evaluation of her abnormal MRI of the brain on 03/11/2021, she will need biopsy for diagnosis of malignancy in view of her prior history of lung cancer. Discussed safety both in and out of the home.  Monitor driving Discussed the importance of regular daily schedule with inclusion of crossword puzzles to maintain brain function.  Stay active at least 30 minutes at least 3 times a week.  Naps should be scheduled and should be no longer than 60 minutes and should not occur after 2 PM.  Mediterranean diet is  recommended  Folllow up in 6 months   Subjective:    The patient is seen in neurologic consultation at the request of Biagio Borg, MD for the evaluation of memory.  The patient is accompanied by  who supplements the history.This is a 75 y.o. year old female who has had memory issues for about 4 years, she states that "does not happen all the time what I cannot remember things ", but when he does, she cannot remember simple task's.  For example, this morning "I forgot how to put the bra ".  Also, she states that she cannot think of the word, but until she gives herself some time, and then "it comes back to me ".  She also reports increasing panic attacks.  She has never seen a psychiatrist, but she admits that about 3 years ago, she has similar symptoms, where she drives and she is afraid of going into the other side of the road.  Also, while driving on [facility], she feels that she is going to get lost or hit someone, and begins that panic "mode ".  She was "fine for 2 years, and then it hit me again this year ".  Of note, her primary care physician gave her Celexa to try, but refused counseling referral.  She sleeps well and denies hallucinations, sleepwalking.  She denies any vivid dreams.  "My only paranoia is about getting lost and how to keep my car in the lane".  She denies leaving objects in unusual places, but she does leave these objects anywhere in the house and then she cannot remember where she left them.  She denies any issues with  bathing and dressing, or medications.  Her husband took over the finances, after she was found over paying "I have an issue with the zeros, I cannot read 100 versus 1000" and has been overpaying.  Her appetite is good, and denies trouble swallowing.  She cooks and denies leaving the stove or the faucet on.  She ambulates without difficulty without the use of a walker or a cane, denies falls or head injuries. Denies headaches, double vision, dizziness, focal  numbness or tingling, unilateral weakness or tremors. Denies urine incontinence or retention. Denies constipation or diarrhea.  She denies anosmia, history of OSA, alcohol or tobacco use.  She stopped smoking many years ago, 46-year pack history.  Family history remarkable for brother and mother with vascular dementia.  She has been under significant amount of stress, after taking care of many family members "a lot on my shoulders, including my mother dying with melanoma ".  My brother is also very ill and I am taking care of him, everybody is taking advantage of me".  Labs 2/ 9, 2022 show vitamin B12 679    Allergies  Allergen Reactions   Augmentin [Amoxicillin-Pot Clavulanate]     Severe diarrhea   Sulfonamide Derivatives Hives and Rash    Current Outpatient Medications  Medication Instructions   alendronate (FOSAMAX) 70 MG tablet TAKE 1 TABLET BY MOUTH ONCE A WEEK. TAKE WITH A FULL GLASS OF WATER ON AN EMPTY STOMACH.   aspirin 81 mg, Oral, Daily,     atorvastatin (LIPITOR) 10 MG tablet TAKE 1 TABLET BY MOUTH EVERY DAY   calcium-vitamin D (OSCAL WITH D) 500-200 MG-UNIT tablet 1 tablet, Oral, Daily,     Cholecalciferol (VITAMIN D) 2000 UNITS CAPS 1 capsule, Oral, Daily,     citalopram (CELEXA) 10 mg, Oral, Daily   fluticasone (FLONASE) 50 MCG/ACT nasal spray SPRAY 2 SPRAYS INTO EACH NOSTRIL EVERY DAY   Fluticasone-Salmeterol (AIRDUO RESPICLICK 834/19) 622-29 MCG/ACT AEPB 2 puffs, Inhalation, 2 times daily   LORazepam (ATIVAN) 1 mg, Oral, At bedtime PRN   montelukast (SINGULAIR) 10 MG tablet TAKE 1 TABLET BY MOUTH EVERYDAY AT BEDTIME   Prevagen 20 mg, Oral, Daily   rivastigmine (EXELON) 1.5 MG capsule Start with 1 capsule 1.5 mg at night for 2 weeks, then increase to 1 tab  1.5 mg twice daily   vitamin B-12 (CYANOCOBALAMIN) 1,000 mcg, Oral, Daily     VITALS:   Vitals:   03/30/21 1252  BP: (!) 207/81  Pulse: 78  Resp: 20  SpO2: 96%  Weight: 118 lb (53.5 kg)  Height: 4\' 8"  (1.422  m)     PHYSICAL EXAM   HEENT:  Normocephalic, atraumatic. The mucous membranes are moist. The superficial temporal arteries are without ropiness or tenderness. Cardiovascular: Regular rate and rhythm. Lungs: Clear to auscultation bilaterally. Neck: There are no carotid bruits noted bilaterally.  NEUROLOGICAL: Montreal Cognitive Assessment  04/01/2021  Visuospatial/ Executive (0/5) 0  Naming (0/3) 2  Attention: Read list of digits (0/2) 2  Attention: Read list of letters (0/1) 1  Attention: Serial 7 subtraction starting at 100 (0/3) 1  Language: Repeat phrase (0/2) 0  Language : Fluency (0/1) 1  Abstraction (0/2) 1  Delayed Recall (0/5) 5  Orientation (0/6) 5  Total 18  Adjusted Score (based on education) 18   MMSE - Mini Mental State Exam 08/02/2020  Orientation to time 5  Orientation to Place 5  Registration 3  Attention/ Calculation 5  Recall 3  Language- name 2  objects 2  Language- repeat 1  Language- follow 3 step command 3  Language- read & follow direction 1  Write a sentence 1  Copy design 0  Total score 29    No flowsheet data found.   Orientation:  Alert and oriented to person, place and time. No aphasia or dysarthria. Fund of knowledge is appropriate. Recent memory impaired and remote memory intact.  Attention and concentration are normal.  Able to name objects and repeat phrases. Delayed recall 5 /5 Cranial nerves: There is good facial symmetry. Extraocular muscles are intact and visual fields are full to confrontational testing. Speech is fluent and clear. Soft palate rises symmetrically and there is no tongue deviation. Hearing is intact to conversational tone. Tone: Tone is good throughout. Sensation: Sensation is intact to light touch and pinprick throughout. Vibration is intact at the bilateral big toe.There is no extinction with double simultaneous stimulation. There is no sensory dermatomal level identified. Coordination: The patient has no difficulty with  RAM's or FNF bilaterally. Normal finger to nose  Motor: Strength is 5/5 in the bilateral upper and lower extremities. There is no pronator drift. There are no fasciculations noted. DTR's: Deep tendon reflexes are 2/4 at the bilateral biceps, triceps, brachioradialis, patella and achilles.  Plantar responses are downgoing bilaterally. Gait and Station: The patient is able to ambulate without difficulty.The patient is able to heel toe walk without any difficulty.The patient is able to ambulate in a tandem fashion. The patient is able to stand in the Romberg position.     Thank you for allowing [place] the opportunity to participate in the care of this nice patient. Please do not hesitate to contact us for any questions or concerns.   Total time spent on today's visit was 45  minutes, including both face-to-face time and nonface-to-face time.  Time included that spent on review of records (prior notes available to me/labs/imaging if pertinent), discussing treatment and goals, answering patient's questions and coordinating care.  Cc:  Biagio Borg, MD  Sharene Butters 04/01/2021 11:59 AM

## 2021-03-30 NOTE — Telephone Encounter (Signed)
[facility] called and LM. They need a call back regarding a drug interaction with citalopram and donepezil.  [phone]

## 2021-04-25 ENCOUNTER — Ambulatory Visit: Payer: Medicare Other | Admitting: Pulmonary Disease

## 2021-04-25 ENCOUNTER — Encounter: Payer: Self-pay | Admitting: Pulmonary Disease

## 2021-04-25 ENCOUNTER — Other Ambulatory Visit: Payer: Self-pay

## 2021-04-25 VITALS — BP 130/82 | HR 99 | Temp 99.0°F | Ht <= 58 in | Wt 116.4 lb

## 2021-04-25 DIAGNOSIS — R9089 Other abnormal findings on diagnostic imaging of central nervous system: Secondary | ICD-10-CM | POA: Diagnosis not present

## 2021-04-25 NOTE — Patient Instructions (Signed)
Nice to meet you  I will talk with everyone and help decide next steps.  If you have not heard back from me by middle of next week, please call the office.  Return as needed based on our discussion

## 2021-04-25 NOTE — Progress Notes (Signed)
@Patient  ID: Pamela Terry, female    DOB: 09-Oct-1945, 75 y.o.   MRN: [id number]  Chief Complaint  Patient presents with   Consult    Referring provider: Biagio Borg, MD  HPI:   75 y.o. woman whom we are seeing in consultation for evaluation of abnormal head imaging.  Most recent neurology note reviewed.  Most recent PCP note reviewed.  Patient feels fine.  Usual state of health.  No breathing concerns.  For history of COPD, PFTs reviewed.  Previous on  LAMA/LABA therapy.  She is no longer using this.  Formally followed with Dr. Lenna Gilford here in pulmonary clinic.  She does have a remote history of lung adenocarcinoma status post right lower lobe lobectomy in 2000.  Work-up for memory problems recently.  Include MRI brain.  This showed concern for amyloidosis as well as solitary right frontal bone lesion.  Recommended CT imaging follow-up is in the differential per radiology read was metastatic lung cancer versus fibrous dysplasia.  Additional imaging was not done.  Per neurology note, neurology recommended a biopsy of this frontal bone lesion.  Subsequently, she was referred here.  Discussed that without perform a biopsy of her frontal bone.  Discussed highly unlikely frontal bone lesion represents metastatic lung cancer given she had lung resection in 2000 and normal chest x-ray earlier 2022 without any evidence of recurrence since her resection in 2000.  Discussed will coordinate ongoing efforts with PCP and neurology provider to ensure I can be as helpful as possible.  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  MMRC: No flowsheet data found.  Epworth:  No flowsheet data found.  Tests:   FENO:  No results  found for: NITRICOXIDE  PFT: No flowsheet data found.  WALK:  No flowsheet data found.  Imaging: Personally reviewed and as per EMR discussion this note  Lab Results: Personally reviewed CBC    Component Value Date/Time   WBC 9.1 08/02/2020 1423   RBC 4.87 08/02/2020 1423   HGB 14.8 08/02/2020 1423   HCT 44.1 08/02/2020 1423   PLT 302.0 08/02/2020 1423   MCV 90.5 08/02/2020 1423   MCHC 33.6 08/02/2020 1423   RDW 13.2 08/02/2020 1423   LYMPHSABS 3.1 08/02/2020 1423   MONOABS 0.6 08/02/2020 1423   EOSABS 0.2 08/02/2020 1423   BASOSABS 0.1 08/02/2020 1423    BMET    Component Value Date/Time   NA 141 08/02/2020 1423   K 4.2 08/02/2020 1423   CL 104 08/02/2020 1423   CO2 30 08/02/2020 1423   GLUCOSE 83 08/02/2020 1423   GLUCOSE 92 04/09/2006 0716   BUN 10 08/02/2020 1423   CREATININE 0.84 08/02/2020 1423   CALCIUM 10.0 08/02/2020 1423   GFRNONAA 75.84 05/10/2010 1110   GFRAA 94 04/27/2008 1239    BNP No results found for: BNP  ProBNP No results found for: PROBNP  Specialty Problems       Pulmonary Problems   ASTHMATIC BRONCHITIS, ACUTE    Qualifier: Diagnosis of  By: Rosana Hoes CMA, Tammy        Malignant neoplasm of bronchus and lung ([place])    Qualifier: History of  By: Lenna Gilford MD, Deborra Medina       Allergic rhinitis    Qualifier: Diagnosis of  By: Lenna Gilford MD, Deborra Medina       COPD (chronic obstructive pulmonary disease) with chronic bronchitis ([place])    Qualifier: Diagnosis of  By: Lenna Gilford MD, Deborra Medina       UPPER RESPIRATORY INFECTION    Qualifier: Diagnosis of  By: Royal Piedra NP, Tammy         Allergies  Allergen Reactions   Augmentin [Amoxicillin-Pot Clavulanate]     Severe diarrhea   Sulfonamide Derivatives Hives and Rash    Immunization History  Administered Date(s) Administered   Fluad Quad(high Dose 65+) 02/26/2019   H1N1 06/21/2008   Influenza Split 04/05/2011, 04/14/2012, 04/05/2013, 03/28/2014   Influenza Whole 04/02/2010    Influenza, High Dose Seasonal PF 03/23/2018   Influenza,inj,Quad PF,6+ Mos 04/10/2015, 04/15/2016   Influenza-Unspecified 03/24/2017, 03/23/2018, 04/24/2020   PFIZER Comirnaty(Gray Top)Covid-19 Tri-Sucrose Vaccine 01/06/2021   PFIZER(Purple Top)SARS-COV-2 Vaccination 07/30/2019, 08/24/2019, 04/24/2020, 02/05/2021   Pneumococcal Conjugate-13 05/14/2016   Pneumococcal Polysaccharide-23 05/18/2012   Tdap 05/20/2011   Zoster, Live 07/08/2011    Past Medical History:  Diagnosis Date   Acute asthmatic bronchitis    Adenocarcinoma, lung (HCC)    Allergic rhinitis    Anxiety    Carcinoma in situ of endocrine gland    Colon polyp    COPD (chronic obstructive pulmonary disease) (HCC)    Diverticulosis of colon    History of cervical cancer    Hypercholesterolemia    Microscopic hematuria    Osteoporosis     Tobacco History: Social History   Tobacco Use  Smoking Status Former   Packs/day: 2.00   Years: 23.00   Pack years: 46.00   Types: Cigarettes   Quit date: 06/24/1985   Years since quitting: 35.8  Smokeless Tobacco Never   Counseling given: Not Answered   Continue to not smoke  Outpatient Encounter Medications as of 04/25/2021  Medication Sig   alendronate (FOSAMAX) 70 MG tablet TAKE 1 TABLET BY MOUTH ONCE A WEEK. TAKE WITH A FULL GLASS OF WATER ON AN EMPTY STOMACH.   Apoaequorin (PREVAGEN) 10 MG CAPS Take 20 mg by mouth daily.   aspirin 81 MG tablet Take 81 mg by mouth daily.   atorvastatin (LIPITOR) 10 MG tablet TAKE 1 TABLET BY MOUTH EVERY DAY   calcium-vitamin D (OSCAL WITH D) 500-200 MG-UNIT tablet Take 1 tablet by mouth daily.   Cholecalciferol (VITAMIN D) 2000 UNITS CAPS Take 1 capsule by mouth daily.   citalopram (CELEXA) 10 MG tablet Take 1 tablet (10 mg total) by mouth daily.   fluticasone (FLONASE) 50 MCG/ACT nasal spray SPRAY 2 SPRAYS INTO EACH NOSTRIL EVERY DAY   Fluticasone-Salmeterol (AIRDUO RESPICLICK 016/01) 093-23 MCG/ACT AEPB Inhale 2 puffs into the  lungs 2 (two) times daily.   LORazepam (ATIVAN) 1 MG tablet Take 1 tablet (1 mg total) by mouth at bedtime as needed.   montelukast (SINGULAIR) 10 MG tablet TAKE 1 TABLET BY MOUTH EVERYDAY AT BEDTIME   rivastigmine (EXELON) 1.5 MG capsule Start with 1 capsule 1.5 mg at night for 2 weeks, then increase to 1 tab  1.5 mg twice daily   vitamin B-12 (CYANOCOBALAMIN) 1000 MCG tablet Take 1 tablet (1,000 mcg total) by mouth daily.   No facility-administered encounter medications on file as of 04/25/2021.     Review of Systems  Review of Systems  No chest pain exertion.  No weight loss.  No night sweats.  No cough.  No orthopnea or PND.  Comprehensive review of systems otherwise negative. Physical Exam  BP 130/82 (BP Location: Left Arm, Patient Position: Sitting)   Pulse 99   Temp 99 F (37.2 C) (Oral)   Ht 4\' 8"  (1.422 m)   Wt 116 lb 6.4 oz (52.8 kg)   SpO2 92%   BMI 26.10 kg/m   Wt Readings from Last 5 Encounters:  04/25/21 116 lb 6.4 oz (52.8 kg)  03/30/21 118 lb (53.5 kg)  02/23/21 118 lb (53.5 kg)  08/02/20 124 lb 6.4 oz (56.4 kg)  08/02/20 124 lb 6.4 oz (56.4 kg)    BMI Readings from Last 5 Encounters:  04/25/21 26.10 kg/m  03/30/21 26.46 kg/m  02/23/21 24.66 kg/m  08/02/20 26.00 kg/m  08/02/20 26.00 kg/m     Physical Exam General: Well-appearing, no distress Eyes: EOMI, no icterus Neck: Supple, JVP Pulmonary: Clear, normal work of breathing Cardiovascular: Regular rate and rhythm, no murmurs Abdomen: Nondistended, bowel sounds present MSK: No synovitis, no joint effusion Neuro: Normal gait, no weakness Psych: Normal mood, full affect   Assessment & Plan:   Frontal bone lesion MRI brain: Seemingly what prompted referral.  Unclear my role as a pulmonologist in further evaluation of this head imaging.  The restate some concern for metastatic lung cancer.  She had a long resection/lobectomy 20+ years ago for lung cancer.  It is extremely unlikely that this  lesion on the frontal bone is related to her cancer resected 20+ years ago.  Will message neurology PA and PCP to help clarify how I can be helpful.  Seems most prudent thing would be to pursue imaging recommended on the MRI for further evaluation of the bone lesion.  Biopsy may be warranted.  This can be directed by the ordering provider.  Given concern for amyloidosis as well as Bone lesion, question underlying myeloma.  Ongoing work-up per PCP.  Report reported history of COPD: Cannot view PFTs to confirm.  Previous old ICS/LABA.  Not using.  Denies any respiratory issues.  Return if symptoms worsen or fail to improve.   Lanier Clam, MD 04/25/2021

## 2021-04-27 DIAGNOSIS — J209 Acute bronchitis, unspecified: Secondary | ICD-10-CM | POA: Diagnosis not present

## 2021-04-30 ENCOUNTER — Telehealth: Payer: Self-pay | Admitting: Internal Medicine

## 2021-04-30 ENCOUNTER — Encounter: Payer: Self-pay | Admitting: Internal Medicine

## 2021-04-30 DIAGNOSIS — G9389 Other specified disorders of brain: Secondary | ICD-10-CM

## 2021-04-30 DIAGNOSIS — C3490 Malignant neoplasm of unspecified part of unspecified bronchus or lung: Secondary | ICD-10-CM

## 2021-04-30 DIAGNOSIS — R413 Other amnesia: Secondary | ICD-10-CM

## 2021-04-30 NOTE — Telephone Encounter (Signed)
Patient says she has a couple of questions she would like to ask the nurse about in regards to the message she received from Dr. Jenny Reichmann  Please call patient [phone]

## 2021-04-30 NOTE — Telephone Encounter (Signed)
Patient calling in  Says she received & read message from Dr.

## 2021-04-30 NOTE — Telephone Encounter (Signed)
Patient return call. I relayed Dr. Gwynn Burly instructions. She understood instructions but preferred to hold off for right now. She has bronchitis and wants to focus on treating that right now. Stated she will revisit it when she comes in for her appointment in February.

## 2021-04-30 NOTE — Telephone Encounter (Signed)
LDVM for pt of Dr. Gwynn Burly instructions.

## 2021-04-30 NOTE — Telephone Encounter (Signed)
After consulting with neurology and pulmonary, it seems a Head CT is the best way to at least check a bit further on what may or may not be a significant lesion in the frontal bone of the head which as you know was incidentally found on the MRI.    To do this, we need a blood test to check on the kidneys, as the CT scan will need to have contrast to make sure what is what on the scan.  I will order the blood test for the kidney function you can have done as you can (no appointment is needed) at the [facility] (in the basement).   I will also order the Head CT, and hopefully you will hear soon.  I am hoping the scan does not show anything suspicious and we can simply monitor after that.     Staff to please inform pt, I will do order for the Head CT, and ask pt to have a kidney blood test done prior to that, since that is required, due to the contrast involved.

## 2021-08-03 ENCOUNTER — Ambulatory Visit: Payer: Medicare Other

## 2021-08-11 ENCOUNTER — Other Ambulatory Visit: Payer: Self-pay | Admitting: Internal Medicine

## 2021-08-11 NOTE — Telephone Encounter (Signed)
Please refill as per office routine med refill policy (all routine meds to be refilled for 3 mo or monthly (per pt preference) up to one year from last visit, then month to month grace period for 3 mo, then further med refills will have to be denied) ? ?

## 2021-08-14 ENCOUNTER — Ambulatory Visit: Payer: Medicare Other

## 2021-08-14 ENCOUNTER — Telehealth: Payer: Self-pay | Admitting: Internal Medicine

## 2021-08-14 DIAGNOSIS — C3490 Malignant neoplasm of unspecified part of unspecified bronchus or lung: Secondary | ICD-10-CM

## 2021-08-14 NOTE — Telephone Encounter (Signed)
Ok this is done 

## 2021-08-14 NOTE — Telephone Encounter (Signed)
Pt requesting a chest xray w/ physical on 09-03-2021, pt states chest xray is routine w/ her physicals

## 2021-08-14 NOTE — Telephone Encounter (Signed)
Called patient and left message to inform patient that the chest x ray order has been put in and the Imaging department will call with that appointment.

## 2021-08-20 DIAGNOSIS — Z961 Presence of intraocular lens: Secondary | ICD-10-CM | POA: Diagnosis not present

## 2021-08-20 DIAGNOSIS — H353121 Nonexudative age-related macular degeneration, left eye, early dry stage: Secondary | ICD-10-CM | POA: Diagnosis not present

## 2021-08-20 DIAGNOSIS — H353211 Exudative age-related macular degeneration, right eye, with active choroidal neovascularization: Secondary | ICD-10-CM | POA: Diagnosis not present

## 2021-08-20 DIAGNOSIS — H02831 Dermatochalasis of right upper eyelid: Secondary | ICD-10-CM | POA: Diagnosis not present

## 2021-08-20 DIAGNOSIS — H02834 Dermatochalasis of left upper eyelid: Secondary | ICD-10-CM | POA: Diagnosis not present

## 2021-08-20 DIAGNOSIS — H16223 Keratoconjunctivitis sicca, not specified as Sjogren's, bilateral: Secondary | ICD-10-CM | POA: Diagnosis not present

## 2021-08-20 DIAGNOSIS — D3131 Benign neoplasm of right choroid: Secondary | ICD-10-CM | POA: Diagnosis not present

## 2021-08-21 DIAGNOSIS — H43813 Vitreous degeneration, bilateral: Secondary | ICD-10-CM | POA: Diagnosis not present

## 2021-08-21 DIAGNOSIS — H35431 Paving stone degeneration of retina, right eye: Secondary | ICD-10-CM | POA: Diagnosis not present

## 2021-08-21 DIAGNOSIS — H353122 Nonexudative age-related macular degeneration, left eye, intermediate dry stage: Secondary | ICD-10-CM | POA: Diagnosis not present

## 2021-08-21 DIAGNOSIS — H35033 Hypertensive retinopathy, bilateral: Secondary | ICD-10-CM | POA: Diagnosis not present

## 2021-08-21 DIAGNOSIS — H353211 Exudative age-related macular degeneration, right eye, with active choroidal neovascularization: Secondary | ICD-10-CM | POA: Diagnosis not present

## 2021-08-28 DIAGNOSIS — H353211 Exudative age-related macular degeneration, right eye, with active choroidal neovascularization: Secondary | ICD-10-CM | POA: Diagnosis not present

## 2021-09-03 ENCOUNTER — Ambulatory Visit: Payer: Medicare Other

## 2021-09-03 ENCOUNTER — Encounter: Payer: Self-pay | Admitting: Internal Medicine

## 2021-09-03 ENCOUNTER — Other Ambulatory Visit: Payer: Self-pay | Admitting: Internal Medicine

## 2021-09-03 ENCOUNTER — Other Ambulatory Visit: Payer: Self-pay

## 2021-09-03 ENCOUNTER — Ambulatory Visit (INDEPENDENT_AMBULATORY_CARE_PROVIDER_SITE_OTHER): Payer: Medicare Other | Admitting: Internal Medicine

## 2021-09-03 ENCOUNTER — Ambulatory Visit (INDEPENDENT_AMBULATORY_CARE_PROVIDER_SITE_OTHER): Payer: Medicare Other

## 2021-09-03 VITALS — BP 132/70 | HR 50 | Temp 99.1°F | Ht <= 58 in | Wt 127.2 lb

## 2021-09-03 DIAGNOSIS — C3490 Malignant neoplasm of unspecified part of unspecified bronchus or lung: Secondary | ICD-10-CM | POA: Diagnosis not present

## 2021-09-03 DIAGNOSIS — E7800 Pure hypercholesterolemia, unspecified: Secondary | ICD-10-CM

## 2021-09-03 DIAGNOSIS — F03B4 Unspecified dementia, moderate, with anxiety: Secondary | ICD-10-CM

## 2021-09-03 DIAGNOSIS — E559 Vitamin D deficiency, unspecified: Secondary | ICD-10-CM

## 2021-09-03 DIAGNOSIS — E538 Deficiency of other specified B group vitamins: Secondary | ICD-10-CM

## 2021-09-03 DIAGNOSIS — Z0001 Encounter for general adult medical examination with abnormal findings: Secondary | ICD-10-CM

## 2021-09-03 DIAGNOSIS — E0590 Thyrotoxicosis, unspecified without thyrotoxic crisis or storm: Secondary | ICD-10-CM

## 2021-09-03 DIAGNOSIS — R739 Hyperglycemia, unspecified: Secondary | ICD-10-CM

## 2021-09-03 DIAGNOSIS — J439 Emphysema, unspecified: Secondary | ICD-10-CM | POA: Diagnosis not present

## 2021-09-03 DIAGNOSIS — R911 Solitary pulmonary nodule: Secondary | ICD-10-CM

## 2021-09-03 DIAGNOSIS — J449 Chronic obstructive pulmonary disease, unspecified: Secondary | ICD-10-CM | POA: Diagnosis not present

## 2021-09-03 DIAGNOSIS — J4489 Other specified chronic obstructive pulmonary disease: Secondary | ICD-10-CM

## 2021-09-03 LAB — URINALYSIS, ROUTINE W REFLEX MICROSCOPIC
Bilirubin Urine: NEGATIVE
Ketones, ur: NEGATIVE
Nitrite: NEGATIVE
Specific Gravity, Urine: 1.025 (ref 1.000–1.030)
Total Protein, Urine: NEGATIVE
Urine Glucose: NEGATIVE
Urobilinogen, UA: 0.2 (ref 0.0–1.0)
pH: 5.5 (ref 5.0–8.0)

## 2021-09-03 LAB — HEPATIC FUNCTION PANEL
ALT: 24 U/L (ref 0–35)
AST: 25 U/L (ref 0–37)
Albumin: 4.2 g/dL (ref 3.5–5.2)
Alkaline Phosphatase: 67 U/L (ref 39–117)
Bilirubin, Direct: 0.1 mg/dL (ref 0.0–0.3)
Total Bilirubin: 0.5 mg/dL (ref 0.2–1.2)
Total Protein: 7.1 g/dL (ref 6.0–8.3)

## 2021-09-03 LAB — CBC WITH DIFFERENTIAL/PLATELET
Basophils Absolute: 0.1 10*3/uL (ref 0.0–0.1)
Basophils Relative: 0.7 % (ref 0.0–3.0)
Eosinophils Absolute: 0.3 10*3/uL (ref 0.0–0.7)
Eosinophils Relative: 4.1 % (ref 0.0–5.0)
HCT: 42.1 % (ref 36.0–46.0)
Hemoglobin: 13.8 g/dL (ref 12.0–15.0)
Lymphocytes Relative: 30.8 % (ref 12.0–46.0)
Lymphs Abs: 2.6 10*3/uL (ref 0.7–4.0)
MCHC: 32.7 g/dL (ref 30.0–36.0)
MCV: 90.9 fl (ref 78.0–100.0)
Monocytes Absolute: 0.8 10*3/uL (ref 0.1–1.0)
Monocytes Relative: 9.4 % (ref 3.0–12.0)
Neutro Abs: 4.6 10*3/uL (ref 1.4–7.7)
Neutrophils Relative %: 55 % (ref 43.0–77.0)
Platelets: 262 10*3/uL (ref 150.0–400.0)
RBC: 4.63 Mil/uL (ref 3.87–5.11)
RDW: 13.4 % (ref 11.5–15.5)
WBC: 8.3 10*3/uL (ref 4.0–10.5)

## 2021-09-03 LAB — BASIC METABOLIC PANEL
BUN: 14 mg/dL (ref 6–23)
CO2: 30 mEq/L (ref 19–32)
Calcium: 9.6 mg/dL (ref 8.4–10.5)
Chloride: 105 mEq/L (ref 96–112)
Creatinine, Ser: 0.92 mg/dL (ref 0.40–1.20)
GFR: 61.31 mL/min (ref >60.00)
Glucose, Bld: 77 mg/dL (ref 70–99)
Potassium: 3.9 mEq/L (ref 3.5–5.1)
Sodium: 142 mEq/L (ref 135–145)

## 2021-09-03 LAB — T4, FREE: Free T4: 0.74 ng/dL (ref 0.60–1.60)

## 2021-09-03 LAB — LIPID PANEL
Cholesterol: 154 mg/dL (ref 0–200)
HDL: 57.2 mg/dL (ref >39.00)
LDL Cholesterol: 68 mg/dL (ref 0–99)
NonHDL: 96.65
Total CHOL/HDL Ratio: 3
Triglycerides: 145 mg/dL (ref 0.0–149.0)
VLDL: 29 mg/dL (ref 0.0–40.0)

## 2021-09-03 LAB — VITAMIN D 25 HYDROXY (VIT D DEFICIENCY, FRACTURES): VITD: 63.53 ng/mL (ref 30.00–100.00)

## 2021-09-03 LAB — VITAMIN B12: Vitamin B-12: >1504 pg/mL — ABNORMAL HIGH (ref 211–911)

## 2021-09-03 LAB — TSH: TSH: 2.17 u[IU]/mL (ref 0.35–5.50)

## 2021-09-03 LAB — HEMOGLOBIN A1C: Hgb A1c MFr Bld: 6 % (ref 4.6–6.5)

## 2021-09-03 MED ORDER — MONTELUKAST SODIUM 10 MG PO TABS: ORAL_TABLET | ORAL | 3 refills | Status: DC

## 2021-09-03 MED ORDER — ALENDRONATE SODIUM 70 MG PO TABS: ORAL_TABLET | ORAL | 3 refills | Status: DC

## 2021-09-03 MED ORDER — RIVASTIGMINE TARTRATE 1.5 MG PO CAPS
ORAL_CAPSULE | ORAL | 3 refills | Status: DC
Start: 2021-09-03 — End: 2021-10-01

## 2021-09-03 MED ORDER — CITALOPRAM HYDROBROMIDE 10 MG PO TABS: 10.0000 mg | ORAL_TABLET | Freq: Every day | ORAL | 3 refills | Status: DC

## 2021-09-03 MED ORDER — ATORVASTATIN CALCIUM 10 MG PO TABS
10.0000 mg | ORAL_TABLET | Freq: Every day | ORAL | 3 refills | Status: DC
Start: 2021-09-03 — End: 2022-07-22

## 2021-09-03 MED ORDER — QUETIAPINE FUMARATE 25 MG PO TABS: 25.0000 mg | ORAL_TABLET | Freq: Every day | ORAL | 3 refills | Status: DC

## 2021-09-03 NOTE — Assessment & Plan Note (Signed)
Lab Results  ?Component Value Date  ? TSH 1.17 08/02/2020  ? ?Stable, pt to continue as is, for f/u TSH today ? ?

## 2021-09-03 NOTE — Patient Instructions (Addendum)
Please continue all other medications as before, and refills have been done if requested. ? ?Please have the pharmacy call with any other refills you may need. ? ?Please continue your efforts at being more active, low cholesterol diet, and weight control. ? ?You are otherwise up to date with prevention measures today. ? ?Please keep your appointments with your specialists as you may have planned ? ?Please go to the XRAY Department in the first floor for the x-ray testing ? ?Please go to the LAB at the blood drawing area for the tests to be done ? ?You will be contacted by phone if any changes need to be made immediately.  Otherwise, you will receive a letter about your results with an explanation, but please check with MyChart first. ? ?Please remember to sign up for MyChart if you have not done so, as this will be important to you in the future with finding out test results, communicating by private email, and scheduling acute appointments online when needed. ? ?Please make an Appointment to return in 6 months, or sooner if needed ?

## 2021-09-03 NOTE — Assessment & Plan Note (Signed)
Lab Results  ?Component Value Date  ? [place] 77 08/02/2020  ? ?Mild uncontrolled, pt to continue current statin lipitor as declines change, goal ldl< 70 ? ?

## 2021-09-03 NOTE — Progress Notes (Signed)
Patient ID: Pamela Terry, female   DOB: April 23, 1946, 76 y.o.   MRN: [id number] ? ? ? ?     Chief Complaint:: wellness exam and dementia worsening, low thyroid, hld, copd, b12 deficiency ? ?     HPI:  Pamela Terry is a 76 y.o. female here for wellness exam; declines covid booster, shingrix, mammogram, tdap o/w up to date  ?         ?              Also has recent worsening macular degeneration now with regular eye injections.  Here with husband who mentions pt worsening now with incresaed anxiety as well, and has been flirting with giving money or access to the bank account to 2 younger female relative and he was hoping to avoid this for him and her as well.  Pt denies chest pain, increased sob or doe, wheezing, orthopnea, PND, increased LE swelling, palpitations, dizziness or syncope.   Pt denies polydipsia, polyuria, or new focal neuro s/s.    Pt denies fever, wt loss, night sweats, loss of appetite, or other constitutional symptoms . Denies hyper or hypo thyroid symptoms such as voice, skin or hair change.  Trying to follow lower chol diet ?  ?Wt Readings from Last 3 Encounters:  ?09/03/21 127 lb 3.2 oz (57.7 kg)  ?04/25/21 116 lb 6.4 oz (52.8 kg)  ?03/30/21 118 lb (53.5 kg)  ? ?BP Readings from Last 3 Encounters:  ?09/03/21 132/70  ?04/25/21 130/82  ?03/30/21 (!) 207/81  ? ?Immunization History  ?Administered Date(s) Administered  ? Fluad Quad(high Dose 65+) 02/26/2019  ? H1N1 06/21/2008  ? Influenza Split 04/05/2011, 04/14/2012, 04/05/2013, 03/28/2014  ? Influenza Whole 04/02/2010  ? Influenza, High Dose Seasonal PF 03/23/2018  ? Influenza,inj,Quad PF,6+ Mos 04/10/2015, 04/15/2016  ? Influenza-Unspecified 03/24/2017, 03/23/2018, 04/24/2020, 04/02/2021  ? PFIZER Comirnaty(Gray Top)Covid-19 Tri-Sucrose Vaccine 01/06/2021  ? PFIZER(Purple Top)SARS-COV-2 Vaccination 07/30/2019, 08/24/2019, 04/24/2020, 02/05/2021  ? Pneumococcal Conjugate-13 05/14/2016  ? Pneumococcal Polysaccharide-23 05/18/2012  ? Tdap 05/20/2011  ?  Zoster, Live 07/08/2011  ? ?There are no preventive care reminders to display for this patient. ? ?  ? ?Past Medical History:  ?Diagnosis Date  ? Acute asthmatic bronchitis   ? Adenocarcinoma, lung ([place])   ? Allergic rhinitis   ? Anxiety   ? Carcinoma in situ of endocrine gland   ? Colon polyp   ? COPD (chronic obstructive pulmonary disease) ([place])   ? Diverticulosis of colon   ? History of cervical cancer   ? Hypercholesterolemia   ? Microscopic hematuria   ? Osteoporosis   ? ?Past Surgical History:  ?Procedure Laterality Date  ? ABDOMINAL HYSTERECTOMY    ? in her 3's  ? APPENDECTOMY    ? TOTAL ABDOMINAL HYSTERECTOMY W/ BILATERAL SALPINGOOPHORECTOMY  1985  ? Dr Ree Edman for Ca of cervix  ? ? reports that she quit smoking about 36 years ago. Her smoking use included cigarettes. She has a 46.00 pack-year smoking history. She has never used smokeless tobacco. She reports that she does not drink alcohol and does not use drugs. ?family history includes Asthma in her maternal aunt; Breast cancer in her paternal grandmother; Diabetes in her brother; Heart disease in her father, maternal grandmother, and paternal grandfather; Hyperlipidemia in her father; Prostate cancer in her maternal grandfather; Rheum arthritis in her mother; Stroke in her father. ?Allergies  ?Allergen Reactions  ? Augmentin [Amoxicillin-Pot Clavulanate]   ?  Severe diarrhea  ? Sulfonamide Derivatives Hives  and Rash  ? ?Current Outpatient Medications on File Prior to Visit  ?Medication Sig Dispense Refill  ? Apoaequorin (PREVAGEN) 10 MG CAPS Take 20 mg by mouth daily.    ? aspirin 81 MG tablet Take 81 mg by mouth daily.    ? calcium-vitamin D (OSCAL WITH D) 500-200 MG-UNIT tablet Take 1 tablet by mouth daily.    ? Cholecalciferol (VITAMIN D) 2000 UNITS CAPS Take 1 capsule by mouth daily.    ? fluticasone (FLONASE) 50 MCG/ACT nasal spray SPRAY 2 SPRAYS INTO EACH NOSTRIL EVERY DAY 48 mL 3  ? LORazepam (ATIVAN) 1 MG tablet Take 1 tablet (1 mg total) by  mouth at bedtime as needed. 90 tablet 1  ? vitamin B-12 (CYANOCOBALAMIN) 1000 MCG tablet Take 1 tablet (1,000 mcg total) by mouth daily. 90 tablet 1  ? ?No current facility-administered medications on file prior to visit.  ? ?     ROS:  All others reviewed and negative. ? ?Objective  ? ?     PE:  BP 132/70 (BP Location: Right Arm, Patient Position: Sitting, Cuff Size: Normal)   Pulse (!) 50   Temp 99.1 ?F (37.3 ?C) (Oral)   Ht 4\' 8"  (1.422 m)   Wt 127 lb 3.2 oz (57.7 kg)   SpO2 98%   BMI 28.52 kg/m?  ? ?              Constitutional: Pt appears in NAD ?              HENT: Head: NCAT.  ?              Right Ear: External ear normal.   ?              Left Ear: External ear normal.  ?              Eyes: . Pupils are equal, round, and reactive to light. Conjunctivae and EOM are normal ?              Nose: without d/c or deformity ?              Neck: Neck supple. Gross normal ROM ?              Cardiovascular: Normal rate and regular rhythm.   ?              Pulmonary/Chest: Effort normal and breath sounds without rales or wheezing.  ?              Abd:  Soft, NT, ND, + BS, no organomegaly ?              Neurological: Pt is alert. At baseline orientation, motor grossly intact but has marked ST memory loss ?              Skin: Skin is warm. No rashes, no other new lesions, LE edema - none ?              Psychiatric: Pt behavior is normal without agitation , mod nervous ? ?Micro: none ? ?Cardiac tracings I have personally interpreted today:  none ? ?Pertinent Radiological findings (summarize): none  ? ?Lab Results  ?Component Value Date  ? WBC 9.1 08/02/2020  ? HGB 14.8 08/02/2020  ? HCT 44.1 08/02/2020  ? PLT 302.0 08/02/2020  ? GLUCOSE 83 08/02/2020  ? CHOL 159 08/02/2020  ? TRIG 83.0 08/02/2020  ? HDL 64.90 08/02/2020  ? [place] 77 08/02/2020  ?  ALT 23 08/02/2020  ? AST 23 08/02/2020  ? NA 141 08/02/2020  ? K 4.2 08/02/2020  ? CL 104 08/02/2020  ? CREATININE 0.84 08/02/2020  ? BUN 10 08/02/2020  ? CO2 30 08/02/2020   ? TSH 1.17 08/02/2020  ? ?Assessment/Plan:  ?REEVE MALLO is a 76 y.o. White or Caucasian [1] female with  has a past medical history of Acute asthmatic bronchitis, Adenocarcinoma, lung ([place]), Allergic rhinitis, Anxiety, Carcinoma in situ of endocrine gland, Colon polyp, COPD (chronic obstructive pulmonary disease) ([place]), Diverticulosis of colon, History of cervical cancer, Hypercholesterolemia, Microscopic hematuria, and Osteoporosis. ? ?Encounter for well adult exam with abnormal findings ?Age and sex appropriate education and counseling updated with regular exercise and diet ?Referrals for preventative services - declines mammogram ?Immunizations addressed - declines tdap, shingrix covid booster ?Smoking counseling  - none needed ?Evidence for depression or other mood disorder - mod anxiety ?Most recent labs reviewed. ?I have personally reviewed and have noted: ?1) the patient's medical and social history ?2) The patient's current medications and supplements ?3) The patient's height, weight, and BMI have been recorded in the chart ? ? ?Subclinical hyperthyroidism ?Lab Results  ?Component Value Date  ? TSH 1.17 08/02/2020  ? ?Stable, pt to continue as is, for f/u TSH today ? ? ?HYPERCHOLESTEROLEMIA ?Lab Results  ?Component Value Date  ? [place] 77 08/02/2020  ? ?Mild uncontrolled, pt to continue current statin lipitor as declines change, goal ldl< 70 ? ? ?Dementia ([place]) ?With subjective worsening, for cont;d enablex, letter written for husband to present at bank ? ?COPD (chronic obstructive pulmonary disease) with chronic bronchitis ([place]) ?Stable overall, for inhaler prn ? ?B12 deficiency ?Lab Results  ?Component Value Date  ? [id number] 08/02/2020  ? ?Stable, cont oral replacement - b12 1000 mcg qd ? ?Followup: Return in about 6 months (around 03/06/2022). ? ?Cathlean Cower, MD 09/03/2021 1:07 PM ?[place] ?[facility] ?Internal Medicine ?

## 2021-09-03 NOTE — Assessment & Plan Note (Signed)
Stable overall, for inhaler prn ?

## 2021-09-03 NOTE — Assessment & Plan Note (Signed)
With subjective worsening, for cont;d enablex, letter written for husband to present at bank ?

## 2021-09-03 NOTE — Assessment & Plan Note (Signed)
Lab Results  ?Component Value Date  ? [id number] 08/02/2020  ? ?Stable, cont oral replacement - b12 1000 mcg qd ? ?

## 2021-09-03 NOTE — Assessment & Plan Note (Signed)
Age and sex appropriate education and counseling updated with regular exercise and diet ?Referrals for preventative services - declines mammogram ?Immunizations addressed - declines tdap, shingrix covid booster ?Smoking counseling  - none needed ?Evidence for depression or other mood disorder - mod anxiety ?Most recent labs reviewed. ?I have personally reviewed and have noted: ?1) the patient's medical and social history ?2) The patient's current medications and supplements ?3) The patient's height, weight, and BMI have been recorded in the chart ? ?

## 2021-09-20 ENCOUNTER — Ambulatory Visit
Admission: RE | Admit: 2021-09-20 | Discharge: 2021-09-20 | Disposition: A | Discharge status: Home / Self Care | Payer: Medicare Other | Source: Ambulatory Visit | Attending: Internal Medicine | Admitting: Internal Medicine

## 2021-09-20 DIAGNOSIS — R911 Solitary pulmonary nodule: Secondary | ICD-10-CM | POA: Diagnosis not present

## 2021-09-20 DIAGNOSIS — J439 Emphysema, unspecified: Secondary | ICD-10-CM | POA: Diagnosis not present

## 2021-09-21 ENCOUNTER — Encounter: Payer: Self-pay | Admitting: Internal Medicine

## 2021-09-21 DIAGNOSIS — R918 Other nonspecific abnormal finding of lung field: Secondary | ICD-10-CM

## 2021-09-21 NOTE — Telephone Encounter (Signed)
Ok this is done 

## 2021-09-21 NOTE — Telephone Encounter (Signed)
Sorry took so long to get back ? ?There is evidence for spots on the lungs that are concerning for possible cancer cells, such as lung cancer.  We should refer asap to pulmonary as they would try to see the best way to prove one way or the other.   ? ?I will refer for asap unless they do not want this ?

## 2021-09-25 DIAGNOSIS — H353122 Nonexudative age-related macular degeneration, left eye, intermediate dry stage: Secondary | ICD-10-CM | POA: Diagnosis not present

## 2021-09-25 DIAGNOSIS — H43813 Vitreous degeneration, bilateral: Secondary | ICD-10-CM | POA: Diagnosis not present

## 2021-09-25 DIAGNOSIS — H353211 Exudative age-related macular degeneration, right eye, with active choroidal neovascularization: Secondary | ICD-10-CM | POA: Diagnosis not present

## 2021-09-28 ENCOUNTER — Encounter: Payer: Self-pay | Admitting: Emergency Medicine

## 2021-09-28 ENCOUNTER — Ambulatory Visit: Payer: Medicare Other | Admitting: Emergency Medicine

## 2021-09-28 ENCOUNTER — Other Ambulatory Visit: Payer: Self-pay | Admitting: Emergency Medicine

## 2021-09-28 DIAGNOSIS — J449 Chronic obstructive pulmonary disease, unspecified: Secondary | ICD-10-CM | POA: Diagnosis not present

## 2021-09-28 DIAGNOSIS — R9389 Abnormal findings on diagnostic imaging of other specified body structures: Secondary | ICD-10-CM

## 2021-09-28 DIAGNOSIS — C3490 Malignant neoplasm of unspecified part of unspecified bronchus or lung: Secondary | ICD-10-CM

## 2021-09-28 NOTE — Patient Instructions (Signed)
We reviewed your CT scan of the chest today. ?We will plan to repeat your CT in late June 2023 to compare with priors. ?Follow Dr. Lamonte Sakai in June after your CT so we can review the results together. ?

## 2021-09-28 NOTE — Assessment & Plan Note (Signed)
CT chest reviewed.  There is a left upper lobe mixed density nodule that is associated with a cystic area and some possible linear scar.  Question inflammatory.  In retrospect I may be able to see some associated scar on prior chest x-rays although it was not clearly seen on film from 08/03/2020.  She also has a small rounded benign-appearing 7 mm nodule at the left base.  Explained to her that etiology of these lesions is not clear although the left basilar nodule is almost certainly benign.  We will plan to follow with a repeat scan in 3 months to look for interval resolution or stability of the left upper lobe lesion.  If suspicion for malignancy, active inflammatory process increases based on repeat imaging then we will discuss diagnostics including possible navigational bronchoscopy. ?

## 2021-09-28 NOTE — Progress Notes (Signed)
? ?Subjective:  ? ? Patient ID: Pamela Terry, female    DOB: 06-May-1946, 76 y.o.   MRN: [id number] ? ?HPI ?76 year old former smoker (46 pack years) followed in the past in our office by Dr. Lenna Gilford, has also seen Dr. Silas Flood.  She has a history of COPD with chronic bronchitis, right lower lobe adenocarcinoma post lobectomy (2000), allergic rhinitis, cervical cancer.  She was last seen here 04/2021 while she was under evaluation for frontal bone lesion.  She had a chest x-ray 09/03/2021 that showed hazy left upper lobe opacity.  She is referred back now for a subsequent abnormal CT scan of the chest. ?She is no longer on bronchodilator therapy.  She does use Singulair, fluticasone nasal spray. ? ? ?CT chest 09/20/2021 reviewed by me shows bilateral calcified thyroid nodules, smooth round 7 mm anterior left lower lobe pulmonary nodule just below the hemidiaphragm that is new since 2011 CT abdomen/pelvis, centrilobular emphysema, 18 mm peripheral left upper lobe spiculated mixed density lesion with some possible associated linear scar that was not mentioned on her CT from 2002 (report only) ? ? ?Review of Systems ?As per HPI ? ?Past Medical History:  ?Diagnosis Date  ? Acute asthmatic bronchitis   ? Adenocarcinoma, lung ([place])   ? Allergic rhinitis   ? Anxiety   ? Carcinoma in situ of endocrine gland   ? Colon polyp   ? COPD (chronic obstructive pulmonary disease) ([place])   ? Diverticulosis of colon   ? History of cervical cancer   ? Hypercholesterolemia   ? Microscopic hematuria   ? Osteoporosis   ?  ? ?Family History  ?Problem Relation Age of Onset  ? Asthma Maternal Aunt   ? Heart disease Father   ? Hyperlipidemia Father   ? Stroke Father   ? Heart disease Paternal Grandfather   ? Heart disease Maternal Grandmother   ? Rheum arthritis Mother   ? Breast cancer Paternal Grandmother   ? Prostate cancer Maternal Grandfather   ? Diabetes Brother   ?  ? ?Social History  ? ?Socioeconomic History  ? Marital status: Married  ?   Spouse name: Not on file  ? Number of children: 1  ? Years of education: Not on file  ? Highest education level: Not on file  ?Occupational History  ? Occupation: Optician, dispensing  ?Tobacco Use  ? Smoking status: Former  ?  Packs/day: 2.00  ?  Years: 23.00  ?  Pack years: 46.00  ?  Types: Cigarettes  ?  Quit date: 06/24/1985  ?  Years since quitting: 36.2  ? Smokeless tobacco: Never  ?Vaping Use  ? Vaping Use: Never used  ?Substance and Sexual Activity  ? Alcohol use: No  ?  Alcohol/week: 0.0 standard drinks  ? Drug use: No  ? Sexual activity: Never  ?Other Topics Concern  ? Not on file  ?Social History Narrative  ? Regular exercise: walkCaffeine use: 3 sodas daily, sweet tea  ? Right handed  ? No caffeine  ? ?Social Determinants of Health  ? ?Financial Resource Strain: Not on file  ?Food Insecurity: Not on file  ?Transportation Needs: Not on file  ?Physical Activity: Not on file  ?Stress: Not on file  ?Social Connections: Not on file  ?Intimate Partner Violence: Not on file  ?  ? ?Allergies  ?Allergen Reactions  ? Augmentin [Amoxicillin-Pot Clavulanate]   ?  Severe diarrhea  ? Sulfonamide Derivatives Hives and Rash  ?  ? ?Outpatient Medications Prior to  Visit  ?Medication Sig Dispense Refill  ? alendronate (FOSAMAX) 70 MG tablet TAKE 1 TABLET BY MOUTH ONCE A WEEK. TAKE WITH A FULL GLASS OF WATER ON AN EMPTY STOMACH. 12 tablet 3  ? Apoaequorin (PREVAGEN) 10 MG CAPS Take 20 mg by mouth daily.    ? aspirin 81 MG tablet Take 81 mg by mouth daily.    ? atorvastatin (LIPITOR) 10 MG tablet Take 1 tablet (10 mg total) by mouth daily. 90 tablet 3  ? calcium-vitamin D (OSCAL WITH D) 500-200 MG-UNIT tablet Take 1 tablet by mouth daily.    ? Cholecalciferol (VITAMIN D) 2000 UNITS CAPS Take 1 capsule by mouth daily.    ? citalopram (CELEXA) 10 MG tablet Take 1 tablet (10 mg total) by mouth daily. 90 tablet 3  ? fluticasone (FLONASE) 50 MCG/ACT nasal spray SPRAY 2 SPRAYS INTO EACH NOSTRIL EVERY DAY 48 mL 3  ? LORazepam (ATIVAN)  1 MG tablet Take 1 tablet (1 mg total) by mouth at bedtime as needed. 90 tablet 1  ? montelukast (SINGULAIR) 10 MG tablet 1 tab by mouth once daily 90 tablet 3  ? QUEtiapine (SEROQUEL) 25 MG tablet Take 1 tablet (25 mg total) by mouth at bedtime. 180 tablet 3  ? rivastigmine (EXELON) 1.5 MG capsule 1 tab  1.5 mg by mouth twice daily 180 capsule 3  ? vitamin B-12 (CYANOCOBALAMIN) 1000 MCG tablet Take 1 tablet (1,000 mcg total) by mouth daily. 90 tablet 1  ? ?No facility-administered medications prior to visit.  ? ? ? ? ?   ?Objective:  ? Physical Exam ? ?Vitals:  ? 09/28/21 1506  ?BP: (!) 182/76  ?Pulse: 67  ?Temp: 98 ?F (36.7 ?C)  ?TempSrc: Oral  ?SpO2: 97%  ?Weight: 127 lb (57.6 kg)  ?Height: 4\' 8"  (1.422 m)  ? ? ?Gen: Pleasant, well-nourished, in no distress,  normal affect ? ?ENT: No lesions,  mouth clear,  oropharynx clear, no postnasal drip ? ?Neck: No JVD, no stridor ? ?Lungs: No use of accessory muscles, no crackles or wheezing on normal respiration, no wheeze on forced expiration ? ?Cardiovascular: RRR, heart sounds normal, no murmur or gallops, no peripheral edema ? ?Musculoskeletal: No deformities, no cyanosis or clubbing ? ?Neuro: alert, awake, non focal ? ?Skin: Warm, no lesions or rash ? ?   ?Assessment & Plan:  ? ?COPD (chronic obstructive pulmonary disease) with chronic bronchitis ([place]) ?Not currently on bronchodilator therapy.  She is not interested in doing so at this time. ? ?Malignant neoplasm of bronchus and lung ([place]) ?Right lower lobe lobectomy in 2000.  Has been stable without recurrence ? ?Abnormal CT scan, chest ?CT chest reviewed.  There is a left upper lobe mixed density nodule that is associated with a cystic area and some possible linear scar.  Question inflammatory.  In retrospect I may be able to see some associated scar on prior chest x-rays although it was not clearly seen on film from 08/03/2020.  She also has a small rounded benign-appearing 7 mm nodule at the left base.  Explained  to her that etiology of these lesions is not clear although the left basilar nodule is almost certainly benign.  We will plan to follow with a repeat scan in 3 months to look for interval resolution or stability of the left upper lobe lesion.  If suspicion for malignancy, active inflammatory process increases based on repeat imaging then we will discuss diagnostics including possible navigational bronchoscopy. ? ? ?Baltazar Apo, MD, PhD ?09/28/2021,  3:28 PM ?[facility] ?[phone] or if no answer before 7:00PM call [phone] ?For any issues after 7:00PM please call eLink [phone] ? ?

## 2021-09-28 NOTE — Assessment & Plan Note (Signed)
Right lower lobe lobectomy in 2000.  Has been stable without recurrence ?

## 2021-09-28 NOTE — Assessment & Plan Note (Signed)
Not currently on bronchodilator therapy.  She is not interested in doing so at this time. ?

## 2021-09-30 NOTE — Progress Notes (Signed)
? ?Assessment/Plan:  ? ?Vascular dementia without behavioral disturbance ? ? Recommendations:  ? ?Discussed safety both in and out of the home.  ?Discussed the importance of regular daily schedule with inclusion of crossword puzzles to maintain brain function.  ?Continue to monitor mood by PCP ?Stay active at least 30 minutes at least 3 times a week.  ?Naps should be scheduled and should be no longer than 60 minutes and should not occur after 2 PM.  ?Mediterranean diet is recommended  ?Control cardiovascular risk factors  ?Start memantine 5 mg, take 1 tablet daily for 2 weeks, and if tolerated, increase to the full dose 5 mg twice daily (patient was unable to tolerate rivastigmine, pharmacy contraindicated as patient to take donepezil due to interaction ?Follow up in  6 months. ? ? ?Case discussed with Dr.Jaffe who agrees with the plan ? ?  ? ? ?Subjective:  ? ? ?Pamela Terry is a very pleasant 76 y.o. RH female COPD history of adenocarcinoma of the right lower lobe with lobectomy in 2000, history of cervical cancer in 1985, hypertension, hyperlipidemia, anxiety, depression and  seen today for evaluation of memory loss.  She was last seen at our office on 04/02/2021 and her last MoCA time was 18/30.  MRI brain on 03/11/2021 was remarkable for mildly advanced cerebral volume loss for age, severe chronic small vessel ischemic disease.  She is on rivastigmine 1.5 mg, unable to tolerate twice daily, and has an "hard time", 1 time daily due to significant bloating, cramping patient diarrhea.  She is unable to take donepezil as it is contraindicated by pharmacy this patient is accompanied in the office by her husband who supplements the history.  Previous records as well as any outside records available were reviewed prior to todays visit.  She reports that her memory is about the same, but words " will come up with a little more time " "I have good and bad days". He reports that she repeats herself often.  Sleeps  well, denies hallucinations, paranoia. She may have has an episode of sleepwalking "was dreaming that my grandson was calling me and went to look for him, it felt real".  Her mood is " not worse" than prior, and is monitored by PCP. She no longer drives.  She denies living objects in unusual places, although she cannot find her cell phone quite frequently.  There are no hygiene concerns, she is independent of bathing and dressing she is in charge of her medications.  Her husband is in charge of the finances and  cooking. Appetite is good, denies trouble swallowing.  She ambulates without difficulty without the use of a walker or a cane, denies unilateral weakness or tremors, urine incontinence or retention.  ? ?  ?Initial Visit 03/2021  The patient is seen in neurologic consultation at the request of Biagio Borg, MD for the evaluation of memory.  The patient is accompanied by  who supplements the history.This is a 76 y.o. year old female who has had memory issues for about 4 years, she states that "does not happen all the time what I cannot remember things ", but when he does, she cannot remember simple task's.  For example, this morning "I forgot how to put the bra ".  Also, she states that she cannot think of the word, but until she gives herself some time, and then "it comes back to me ".  She also reports increasing panic attacks.  She has never seen a  psychiatrist, but she admits that about 3 years ago, she has similar symptoms, where she drives and she is afraid of going into the other side of the road.  Also, while driving on [facility] [address], she feels that she is going to get lost or hit someone, and begins that panic "mode ".  She was "fine for 2 years, and then it hit me again this year ".  Of note, her primary care physician gave her Celexa to try, but refused counseling referral.  She sleeps well and denies hallucinations, sleepwalking.  She denies any vivid dreams.  "My only paranoia is about getting  lost and how to keep my car in the lane".  She denies leaving objects in unusual places, but she does leave these objects anywhere in the house and then she cannot remember where she left them.  She denies any issues with bathing and dressing, or medications.  Her husband took over the finances, after she was found over paying "I have an issue with the zeros, I cannot read 100 versus 1000" and has been overpaying.  Her appetite is good, and denies trouble swallowing.  She cooks and denies leaving the stove or the faucet on.  She ambulates without difficulty without the use of a walker or a cane, denies falls or head injuries. ?Denies headaches, double vision, dizziness, focal numbness or tingling, unilateral weakness or tremors. Denies urine incontinence or retention. Denies constipation or diarrhea.  She denies anosmia, history of OSA, alcohol or tobacco use.  She stopped smoking many years ago, 46-year pack history.  Family history remarkable for brother and mother with vascular dementia.  She has been under significant amount of stress, after taking care of many family members "a lot on my shoulders, including my mother dying with melanoma ".  My brother is also very ill and I am taking care of him, everybody is taking advantage of me". ?  ?Labs 2/ 9, 2022 show vitamin B12 679 TSH 1.17 ? ? MRI of the brain on 03/11/2021 shows scattered foci of susceptibility throughout the bilateral cerebral hemispheres and right cerebellar hemisphere, nonspecific but can be seen in setting of cerebral amyloid angiopathy. In addition osseous metastatic disease in the setting of a history of lung cancer but could also represent a focus of fibrous dysplasia. Mildly advanced cerebral volume loss for age, with sequela of severe chronic small vessel ischemic disease in the periventricular white matter and pons was noted.  ?  ? ?PREVIOUS MEDICATIONS:  ? ?CURRENT MEDICATIONS:  ?Outpatient Encounter Medications as of 10/01/2021   ?Medication Sig  ? alendronate (FOSAMAX) 70 MG tablet TAKE 1 TABLET BY MOUTH ONCE A WEEK. TAKE WITH A FULL GLASS OF WATER ON AN EMPTY STOMACH.  ? Apoaequorin (PREVAGEN) 10 MG CAPS Take 20 mg by mouth daily.  ? aspirin 81 MG tablet Take 81 mg by mouth daily.  ? atorvastatin (LIPITOR) 10 MG tablet Take 1 tablet (10 mg total) by mouth daily.  ? calcium-vitamin D (OSCAL WITH D) 500-200 MG-UNIT tablet Take 1 tablet by mouth daily.  ? Cholecalciferol (VITAMIN D) 2000 UNITS CAPS Take 1 capsule by mouth daily.  ? citalopram (CELEXA) 10 MG tablet Take 1 tablet (10 mg total) by mouth daily.  ? fluticasone (FLONASE) 50 MCG/ACT nasal spray SPRAY 2 SPRAYS INTO EACH NOSTRIL EVERY DAY  ? LORazepam (ATIVAN) 1 MG tablet Take 1 tablet (1 mg total) by mouth at bedtime as needed.  ? memantine (NAMENDA) 5 MG tablet Take 1 tablet (  5 mg at night) for 2 weeks, then increase to 1 tablet (5 mg) twice a day  ? montelukast (SINGULAIR) 10 MG tablet 1 tab by mouth once daily  ? QUEtiapine (SEROQUEL) 25 MG tablet Take 1 tablet (25 mg total) by mouth at bedtime.  ? vitamin B-12 (CYANOCOBALAMIN) 1000 MCG tablet Take 1 tablet (1,000 mcg total) by mouth daily.  ? [DISCONTINUED] rivastigmine (EXELON) 1.5 MG capsule 1 tab  1.5 mg by mouth twice daily (Patient taking differently: 1 tab  1.5 mg by mouth twice daily ?Only taken one tablet now)  ? ?No facility-administered encounter medications on file as of 10/01/2021.  ? ? ? ?Objective:  ?  ? ?PHYSICAL EXAMINATION:   ? ?VITALS:   ?Vitals:  ? 10/01/21 0849  ?BP: (!) 158/75  ?Pulse: 73  ?Resp: 18  ?SpO2: 98%  ?Weight: 126 lb (57.2 kg)  ?Height: 4\' 8"  (1.422 m)  ? ? ?GEN:  The patient appears stated age and is in NAD. ?HEENT:  Normocephalic, atraumatic.  ? ?Neurological examination: ? ?General: NAD, well-groomed, appears stated age. ?Orientation: The patient is alert. Oriented to person, place and not to date. July . Delayed recall 3/3  ?Cranial nerves: There is good facial symmetry.The speech is fluent and  clear. No aphasia or dysarthria. Fund of knowledge is appropriate. Recent and remote memory are impaired. Attention and concentration are normal  Able to name objects and repeat phrases.  Hearing is intact to [employer]

## 2021-10-01 ENCOUNTER — Encounter: Payer: Self-pay | Admitting: Physician Assistant

## 2021-10-01 ENCOUNTER — Ambulatory Visit: Payer: Medicare Other | Admitting: Physician Assistant

## 2021-10-01 VITALS — BP 158/75 | HR 73 | Resp 18 | Ht <= 58 in | Wt 126.0 lb

## 2021-10-01 DIAGNOSIS — F0150 Vascular dementia without behavioral disturbance: Secondary | ICD-10-CM

## 2021-10-01 MED ORDER — MEMANTINE HCL 5 MG PO TABS: ORAL_TABLET | ORAL | 11 refills | Status: DC

## 2021-10-01 NOTE — Patient Instructions (Signed)
It was a pleasure to see you today at our office.  ? ?Recommendations: ? ?We will start  memantine 1 tablet (5mg ) daily for 2  weeks.  If you are tolerating the medication, then after 2 weeks, we will increase the dose to a full tablet of 5 mg  2 times a day    ?Follow up in 6 months  ?Please follow up with pulmonary and family doctor for the other medical issues  ? ?RECOMMENDATIONS FOR ALL PATIENTS WITH MEMORY PROBLEMS: ?1. Continue to exercise (Recommend 30 minutes of walking everyday, or 3 hours every week) ?2. Increase social interactions - continue going to [place] and enjoy social gatherings with friends and family ?3. Eat healthy, avoid fried foods and eat more fruits and vegetables ?4. Maintain adequate blood pressure, blood sugar, and blood cholesterol level. Reducing the risk of stroke and cardiovascular disease also helps promoting better memory. ?5. Avoid stressful situations. Live a simple life and avoid aggravations. Organize your time and prepare for the next day in anticipation. ?6. Sleep well, avoid any interruptions of sleep and avoid any distractions in the bedroom that may interfere with adequate sleep quality ?7. Avoid sugar, avoid sweets as there is a strong link between excessive sugar intake, diabetes, and cognitive impairment ?We discussed the Mediterranean diet, which has been shown to help patients reduce the risk of progressive memory disorders and reduces cardiovascular risk. This includes eating fish, eat fruits and green leafy vegetables, nuts like almonds and hazelnuts, walnuts, and also use olive oil. Avoid fast foods and fried foods as much as possible. Avoid sweets and sugar as sugar use has been linked to worsening of memory function. ? ?There is always a concern of gradual progression of memory problems. If this is the case, then we may need to adjust level of care according to patient needs. Support, both to the patient and caregiver, should then be put into place.  ? ?FALL  PRECAUTIONS: Be cautious when walking. Scan the area for obstacles that may increase the risk of trips and falls. When getting up in the mornings, sit up at the edge of the bed for a few minutes before getting out of bed. Consider elevating the bed at the head end to avoid drop of blood pressure when getting up. Walk always in a well-lit room (use night lights in the walls). Avoid area rugs or power cords from appliances in the middle of the walkways. Use a walker or a cane if necessary and consider physical therapy for balance exercise. Get your eyesight checked regularly. ? ?FINANCIAL OVERSIGHT: Supervision, especially oversight when making financial decisions or transactions is also recommended. ? ?HOME SAFETY: Consider the safety of the kitchen when operating appliances like stoves, microwave oven, and blender. Consider having supervision and share cooking responsibilities until no longer able to participate in those. Accidents with firearms and other hazards in the house should be identified and addressed as well. ? ? ?ABILITY TO BE LEFT ALONE: If patient is unable to contact 911 operator, consider using LifeLine, or when the need is there, arrange for someone to stay with patients. Smoking is a fire hazard, consider supervision or cessation. Risk of wandering should be assessed by caregiver and if detected at any point, supervision and safe proof recommendations should be instituted. ? ?MEDICATION SUPERVISION: Inability to self-administer medication needs to be constantly addressed. Implement a mechanism to ensure safe administration of the medications. ? ? ?DRIVING: Regarding driving, in patients with progressive memory problems, driving will  be impaired. We advise to have someone else do the driving if trouble finding directions or if minor accidents are reported. Independent driving assessment is available to determine safety of driving. ? ? ?If you are interested in the driving assessment, you can contact  the following: ? ?[facility] [employer] in [place] ? ?[place] [phone] ? ?[facility] [phone] ? ?[facility] [phone] or [phone] ? ? ? ?Mediterranean Diet ?A Mediterranean diet refers to food and lifestyle choices that are based on the traditions of countries located on the [employer]. This way of eating has been shown to help prevent certain conditions and improve outcomes for people who have chronic diseases, like kidney disease and heart disease. ?What are tips for following this plan? ?Lifestyle  ?Cook and eat meals together with your family, when possible. ?Drink enough fluid to keep your urine clear or pale yellow. ?Be physically active every day. This includes: ?Aerobic exercise like running or swimming. ?Leisure activities like gardening, walking, or housework. ?Get 7-8 hours of sleep each night. ?If recommended by your health care provider, drink red wine in moderation. This means 1 glass a day for nonpregnant women and 2 glasses a day for men. A glass of wine equals 5 oz (150 mL). ?Reading food labels  ?Check the serving size of packaged foods. For foods such as rice and pasta, the serving size refers to the amount of cooked product, not dry. ?Check the total fat in packaged foods. Avoid foods that have saturated fat or trans fats. ?Check the ingredients list for added sugars, such as corn syrup. ?Shopping  ?At the grocery store, buy most of your food from the areas near the walls of the store. This includes: ?Fresh fruits and vegetables (produce). ?Grains, beans, nuts, and seeds. Some of these may be available in unpackaged forms or large amounts (in bulk). ?Fresh seafood. ?Poultry and eggs. ?Low-fat dairy products. ?Buy whole ingredients instead of prepackaged foods. ?Buy fresh fruits and vegetables in-season from local farmers markets. ?Buy frozen fruits and vegetables in resealable bags. ?If you do not have access to  quality fresh seafood, buy precooked frozen shrimp or canned fish, such as tuna, salmon, or sardines. ?Buy small amounts of raw or cooked vegetables, salads, or olives from the deli or salad bar at your store. ?Stock your pantry so you always have certain foods on hand, such as olive oil, canned tuna, canned tomatoes, rice, pasta, and beans. ?Cooking  ?Cook foods with extra-virgin olive oil instead of using butter or other vegetable oils. ?Have meat as a side dish, and have vegetables or grains as your main dish. This means having meat in small portions or adding small amounts of meat to foods like pasta or stew. ?Use beans or vegetables instead of meat in common dishes like chili or lasagna. ?Experiment with different cooking methods. Try roasting or broiling vegetables instead of steaming or saut?eing them. ?Add frozen vegetables to soups, stews, pasta, or rice. ?Add nuts or seeds for added healthy fat at each meal. You can add these to yogurt, salads, or vegetable dishes. ?Marinate fish or vegetables using olive oil, lemon juice, garlic, and fresh herbs. ?Meal planning  ?Plan to eat 1 vegetarian meal one day each week. Try to work up to 2 vegetarian meals, if possible. ?Eat seafood 2 or more times a week. ?Have healthy snacks readily available, such as: ?Vegetable sticks with hummus. ?[place] yogurt. ?Fruit and nut trail mix. ?Eat balanced meals throughout the week.  This includes: ?Fruit: 2-3 servings a day ?Vegetables: 4-5 servings a day ?Low-fat dairy: 2 servings a day ?Fish, poultry, or lean meat: 1 serving a day ?Beans and legumes: 2 or more servings a week ?Nuts and seeds: 1-2 servings a day ?Whole grains: 6-8 servings a day ?Extra-virgin olive oil: 3-4 servings a day ?Limit red meat and sweets to only a few servings a month ?What are my food choices? ?Mediterranean diet ?Recommended ?Grains: Whole-grain pasta. Brown rice. Bulgar wheat. Polenta. Couscous. Whole-wheat bread. [place]. ?Vegetables:  Artichokes. Beets. Broccoli. Cabbage. Carrots. Eggplant. Green beans. Chard. Kale. Spinach. Onions. Leeks. Peas. Squash. Tomatoes. Peppers. Radishes. ?Fruits: Apples. Apricots. Avocado. Berries. Bananas. Cherries. D

## 2021-10-25 ENCOUNTER — Other Ambulatory Visit: Payer: Self-pay | Admitting: Physician Assistant

## 2021-10-30 DIAGNOSIS — H43813 Vitreous degeneration, bilateral: Secondary | ICD-10-CM | POA: Diagnosis not present

## 2021-10-30 DIAGNOSIS — H353122 Nonexudative age-related macular degeneration, left eye, intermediate dry stage: Secondary | ICD-10-CM | POA: Diagnosis not present

## 2021-10-30 DIAGNOSIS — H353211 Exudative age-related macular degeneration, right eye, with active choroidal neovascularization: Secondary | ICD-10-CM | POA: Diagnosis not present

## 2021-10-30 DIAGNOSIS — H35033 Hypertensive retinopathy, bilateral: Secondary | ICD-10-CM | POA: Diagnosis not present

## 2021-11-22 ENCOUNTER — Other Ambulatory Visit: Payer: Self-pay | Admitting: Physician Assistant

## 2021-11-27 DIAGNOSIS — H353211 Exudative age-related macular degeneration, right eye, with active choroidal neovascularization: Secondary | ICD-10-CM | POA: Diagnosis not present

## 2021-11-27 DIAGNOSIS — H353122 Nonexudative age-related macular degeneration, left eye, intermediate dry stage: Secondary | ICD-10-CM | POA: Diagnosis not present

## 2021-12-10 ENCOUNTER — Ambulatory Visit
Admission: RE | Admit: 2021-12-10 | Discharge: 2021-12-10 | Disposition: A | Discharge status: Home / Self Care | Payer: Medicare Other | Source: Ambulatory Visit | Attending: Internal Medicine | Admitting: Internal Medicine

## 2021-12-10 DIAGNOSIS — I700 Atherosclerosis of aorta: Secondary | ICD-10-CM | POA: Diagnosis not present

## 2021-12-10 DIAGNOSIS — Z85110 Personal history of malignant carcinoid tumor of bronchus and lung: Secondary | ICD-10-CM | POA: Diagnosis not present

## 2021-12-10 DIAGNOSIS — R9389 Abnormal findings on diagnostic imaging of other specified body structures: Secondary | ICD-10-CM

## 2021-12-18 ENCOUNTER — Ambulatory Visit: Payer: Medicare Other | Admitting: Emergency Medicine

## 2021-12-18 NOTE — Progress Notes (Signed)
@Patient  ID: Pamela Terry, female    DOB: May 08, 1946, 76 y.o.   MRN: [id number]  Chief Complaint  Patient presents with   Follow-up    Sob-same, occass. wheezing    Referring provider: Biagio Borg, MD  HPI: 76 year-old female, former smoker quit 1987 (46-pack-year history).  Past medical history significant for COPD, allergic rhinitis, malignant neoplasm of bronchus and lung, dementia, malignant neoplasm of cervix.  Patient of Dr. Lamonte Sakai.  Previous [facility] pulmonary encounters 09/28/2021  76 year old former smoker (46 pack years) followed in the past in our office by Dr. Lenna Gilford, has also seen Dr. Silas Flood.  She has a history of COPD with chronic bronchitis, right lower lobe adenocarcinoma post lobectomy (2000), allergic rhinitis, cervical cancer.  She was last seen here 04/2021 while she was under evaluation for frontal bone lesion.  She had a chest x-ray 09/03/2021 that showed hazy left upper lobe opacity.  She is referred back now for a subsequent abnormal CT scan of the chest. She is no longer on bronchodilator therapy.  She does use Singulair, fluticasone nasal spray.   CT chest 09/20/2021 reviewed by me shows bilateral calcified thyroid nodules, smooth round 7 mm anterior left lower lobe pulmonary nodule just below the hemidiaphragm that is new since 2011 CT abdomen/pelvis, centrilobular emphysema, 18 mm peripheral left upper lobe spiculated mixed density lesion with some possible associated linear scar that was not mentioned on her CT from 2002 (report only)  12/19/2021 Patient presents today for 27-month follow-up.  Super D CT chest on 12/10/21 showed multiple nodular densities within both lungs.  There is no significant change when compared to recent study from March 2023.  No prior studies available.  Patient has a history of right lower lobectomy and lung cancer. PET scan may be helpful in differentiating between vascular structures and suspected nodule. She is not interested in getting  additional testing such as PET scan or bronchoscopy.   She is not having any respiratory symptoms No hemoptysis or weight loss She has dyspnea with moderate exertion Morning cough without significant sputum production  She is not on maintenance bronchodilator. Has not felt that she has needed albuterol or rescue inhaler She takes Singulair 10mg  bedtime  Patient does not wish to proceed with PET scan or bronchoscopy, discussed findings with Dr. Lamonte Sakai and he would like to repeat Super D CT chest in 3 months   Allergies  Allergen Reactions   Augmentin [Amoxicillin-Pot Clavulanate]     Severe diarrhea   Sulfonamide Derivatives Hives and Rash    Immunization History  Administered Date(s) Administered   Fluad Quad(high Dose 65+) 02/26/2019   H1N1 06/21/2008   Influenza Split 04/05/2011, 04/14/2012, 04/05/2013, 03/28/2014   Influenza Whole 04/02/2010   Influenza, High Dose Seasonal PF 03/23/2018   Influenza,inj,Quad PF,6+ Mos 04/10/2015, 04/15/2016   Influenza-Unspecified 03/24/2017, 03/23/2018, 04/24/2020, 04/02/2021   PFIZER Comirnaty(Gray Top)Covid-19 Tri-Sucrose Vaccine 01/06/2021   PFIZER(Purple Top)SARS-COV-2 Vaccination 07/30/2019, 08/24/2019, 04/24/2020, 02/05/2021   Pneumococcal Conjugate-13 05/14/2016   Pneumococcal Polysaccharide-23 05/18/2012   Tdap 05/20/2011   Zoster, Live 07/08/2011    Past Medical History:  Diagnosis Date   Acute asthmatic bronchitis    Adenocarcinoma, lung (HCC)    Allergic rhinitis    Anxiety    Carcinoma in situ of endocrine gland    Colon polyp    COPD (chronic obstructive pulmonary disease) (HCC)    Diverticulosis of colon    History of cervical cancer    Hypercholesterolemia    Microscopic hematuria  Osteoporosis     Tobacco History: Social History   Tobacco Use  Smoking Status Former   Packs/day: 2.00   Years: 23.00   Total pack years: 46.00   Types: Cigarettes   Quit date: 06/24/1985   Years since quitting: 36.5  Smokeless  Tobacco Never   Counseling given: Not Answered   Outpatient Medications Prior to Visit  Medication Sig Dispense Refill   alendronate (FOSAMAX) 70 MG tablet TAKE 1 TABLET BY MOUTH ONCE A WEEK. TAKE WITH A FULL GLASS OF WATER ON AN EMPTY STOMACH. 12 tablet 3   Apoaequorin (PREVAGEN) 10 MG CAPS Take 20 mg by mouth daily.     aspirin 81 MG tablet Take 81 mg by mouth daily.     atorvastatin (LIPITOR) 10 MG tablet Take 1 tablet (10 mg total) by mouth daily. 90 tablet 3   calcium-vitamin D (OSCAL WITH D) 500-200 MG-UNIT tablet Take 1 tablet by mouth daily.     Cholecalciferol (VITAMIN D) 2000 UNITS CAPS Take 1 capsule by mouth daily.     citalopram (CELEXA) 10 MG tablet Take 1 tablet (10 mg total) by mouth daily. 90 tablet 3   fluticasone (FLONASE) 50 MCG/ACT nasal spray SPRAY 2 SPRAYS INTO EACH NOSTRIL EVERY DAY 48 mL 3   LORazepam (ATIVAN) 1 MG tablet Take 1 tablet (1 mg total) by mouth at bedtime as needed. 90 tablet 1   memantine (NAMENDA) 5 MG tablet TAKE 1 TABLET (5 MG AT NIGHT) FOR 2 WEEKS, THEN INCREASE TO 1 TABLET (5 MG) TWICE A DAY 180 tablet 1   montelukast (SINGULAIR) 10 MG tablet 1 tab by mouth once daily 90 tablet 3   QUEtiapine (SEROQUEL) 25 MG tablet Take 1 tablet (25 mg total) by mouth at bedtime. 180 tablet 3   vitamin B-12 (CYANOCOBALAMIN) 1000 MCG tablet Take 1 tablet (1,000 mcg total) by mouth daily. 90 tablet 1   No facility-administered medications prior to visit.   Review of Systems  Review of Systems  Constitutional: Negative.   HENT: Negative.    Respiratory:  Negative for chest tightness, shortness of breath and wheezing.        Dyspnea with exertion  Cardiovascular: Negative.    Physical Exam  BP (!) 150/76 (BP Location: Left Arm, Cuff Size: Normal)   Pulse (!) 50   Temp 98 F (36.7 C) (Temporal)   Ht 4\' 8"  (1.422 m)   Wt 129 lb 3.2 oz (58.6 kg)   SpO2 98%   BMI 28.97 kg/m  Physical Exam Constitutional:      Appearance: Normal appearance.  HENT:      Head: Normocephalic and atraumatic.     Right Ear: Tympanic membrane and external ear normal. There is no impacted cerumen.     Left Ear: Tympanic membrane and external ear normal. There is no impacted cerumen.     Mouth/Throat:     Mouth: Mucous membranes are moist.     Pharynx: Oropharynx is clear.  Cardiovascular:     Rate and Rhythm: Normal rate and regular rhythm.  Pulmonary:     Effort: Pulmonary effort is normal.     Breath sounds: Normal breath sounds.     Comments: CTA Skin:    General: Skin is warm and dry.  Neurological:     General: No focal deficit present.     Mental Status: She is alert and oriented to person, place, and time. Mental status is at baseline.  Psychiatric:  Mood and Affect: Mood normal.        Behavior: Behavior normal.        Thought Content: Thought content normal.        Judgment: Judgment normal.      Lab Results:  CBC    Component Value Date/Time   WBC 8.3 09/03/2021 1020   RBC 4.63 09/03/2021 1020   HGB 13.8 09/03/2021 1020   HCT 42.1 09/03/2021 1020   PLT 262.0 09/03/2021 1020   MCV 90.9 09/03/2021 1020   MCHC 32.7 09/03/2021 1020   RDW 13.4 09/03/2021 1020   LYMPHSABS 2.6 09/03/2021 1020   MONOABS 0.8 09/03/2021 1020   EOSABS 0.3 09/03/2021 1020   BASOSABS 0.1 09/03/2021 1020    BMET    Component Value Date/Time   NA 142 09/03/2021 1020   K 3.9 09/03/2021 1020   CL 105 09/03/2021 1020   CO2 30 09/03/2021 1020   GLUCOSE 77 09/03/2021 1020   GLUCOSE 92 04/09/2006 0716   BUN 14 09/03/2021 1020   CREATININE 0.92 09/03/2021 1020   CALCIUM 9.6 09/03/2021 1020   GFRNONAA 75.84 05/10/2010 1110   GFRAA 94 04/27/2008 1239    BNP No results found for: "BNP"  ProBNP No results found for: "PROBNP"  Imaging: CT Super D Chest Wo Contrast  Result Date: 12/11/2021 CLINICAL DATA:  Follow-up left upper lobe and left lower lobe lesions. History of lung cancer. EXAM: CT CHEST WITHOUT CONTRAST TECHNIQUE: Multidetector CT  imaging of the chest was performed using thin slice collimation for electromagnetic bronchoscopy planning purposes, without intravenous contrast. RADIATION DOSE REDUCTION: This exam was performed according to the departmental dose-optimization program which includes automated exposure control, adjustment of the mA and/or kV according to patient size and/or use of iterative reconstruction technique. COMPARISON:  09/20/2021 FINDINGS: Cardiovascular: Heart size normal. Aortic atherosclerosis and coronary artery calcifications. No pericardial effusion identified. Mediastinum/Nodes: No enlarged mediastinal or axillary lymph nodes. Thyroid gland, trachea, and esophagus demonstrate no significant findings. Lungs/Pleura: Status post right lower lobectomy. No pleural effusion, airspace consolidation, atelectasis or pneumothorax. Bilateral nodular densities are again identified. -within the central aspect of the right middle lobe there is a concern for a central nodule (difficult to differentiate from surrounding vascular structures due to lack of contrast) measuring approximately 1 cm, image 91/5 and image 66/4. -Persistent part solid nodular density within the subpleural paramediastinal left upper lobe measuring 1.2 cm, image 26/5. The solid component measures 5 mm and is unchanged compared with the previous exam -Unchanged appearance of irregular nodular lesion with surrounding architectural distortion within the anterolateral left upper lobe. This extends to the pleural surface measuring in total 2.4 x 1.5 cm, image 31/5. Not significantly changed from previous exam. -Small non solid nodule within the subpleural aspect of the posterolateral left upper lobe abuts the oblique fissure measuring 6 mm, image 41/5. Unchanged from previous exam. -Scar like density is noted within the lingula, image 94/5. -Smoothly marginated, solid nodule within the lateral left lung base appears unchanged measuring 7 mm. Upper Abdomen: No acute  abnormality. Low-attenuation structure within right lobe of liver measures 2.3 cm and appears unchanged from the previous exam. This corresponds to previously characterized benign liver hemangioma. Aortic atherosclerotic calcifications identified. Small hiatal hernia. Musculoskeletal: No chest wall mass or suspicious bone lesions identified. IMPRESSION: 1. Again noted are multiple nodular densities within both lungs. These are not significantly changed when compared with the recent study from 09/20/2021. Unfortunately, there are no more remote studies available for comparison  in this patient who has a history of lung cancer and is status post right lower lobectomy. 2. Not mentioned on the previous exam is a suspected central right middle lobe perihilar nodule measuring 1 cm. PET-CT may be helpful to differentiate between vascular structures and suspected nodule. 3. Consider further evaluation with PET-CT prior to biopsy to identify areas of hypermetabolism which may differentiate areas of postinflammatory change versus underlying recurrent tumor or metastatic disease. Aortic Atherosclerosis (ICD10-I70.0) and Emphysema (ICD10-J43.9). Electronically Signed   By: Kerby Moors M.D.   On: 12/11/2021 10:17     Assessment & Plan:   Malignant neoplasm of bronchus and lung (HCC) Hx right lower lobe adenocarcinoma status post lobectomy in 2000.  Patient had repeat CT imaging in June that showed multiple nodular densities within both lungs, no significant change when compared to most recent study in March 2023.  No prior studies available.  Radiology recommended PET scan.   Patient does not wish to proceed with additional testing or bronchoscopy at this time. Recommend repeating Super D CT chest in 3 months to monitor nodules for growth   COPD (chronic obstructive pulmonary disease) with chronic bronchitis (HCC) No acute respiratory symptoms. Dyspnea with moderate exertion. Not on maintenance inhaler. No SABA  need.   Allergic rhinitis Continue Singulair 10mg  at bedtime    Martyn Ehrich, NP 12/19/2021

## 2021-12-19 ENCOUNTER — Ambulatory Visit: Payer: Medicare Other | Admitting: Primary Care

## 2021-12-19 ENCOUNTER — Encounter: Payer: Self-pay | Admitting: Primary Care

## 2021-12-19 ENCOUNTER — Telehealth: Payer: Self-pay | Admitting: Primary Care

## 2021-12-19 VITALS — BP 150/76 | HR 50 | Temp 98.0°F | Ht <= 58 in | Wt 129.2 lb

## 2021-12-19 DIAGNOSIS — R9389 Abnormal findings on diagnostic imaging of other specified body structures: Secondary | ICD-10-CM | POA: Diagnosis not present

## 2021-12-19 DIAGNOSIS — J309 Allergic rhinitis, unspecified: Secondary | ICD-10-CM

## 2021-12-19 DIAGNOSIS — J449 Chronic obstructive pulmonary disease, unspecified: Secondary | ICD-10-CM | POA: Diagnosis not present

## 2021-12-19 DIAGNOSIS — C3490 Malignant neoplasm of unspecified part of unspecified bronchus or lung: Secondary | ICD-10-CM | POA: Diagnosis not present

## 2021-12-19 NOTE — Telephone Encounter (Signed)
-----   Message from Collene Gobble, MD sent at 12/19/2021  2:00 PM EDT ----- Regarding: RE: Lung nodule Yes I'm ok with that plan.  RB  ----- Message ----- From: Martyn Ehrich, NP Sent: 12/19/2021  10:35 AM EDT To: Collene Gobble, MD Subject: Lung nodule                                    Patient of yours with history of right lower lobe adenocarcinoma status post lobectomy in 2000.  Patient had repeat CT imaging in June that showed multiple nodular densities within both lungs, no significant change when compared to most recent study in March 2023.  No prior studies available.  Radiology recommended PET scan.   Patient does not wish to proceed with additional testing or bronchoscopy at this time.  Are you okay with repeating CT super D in 6 months?  -[employer]

## 2021-12-19 NOTE — Patient Instructions (Signed)
CT chest in June showed multiple nodular densities within both lungs, no significant change when compared to most recent study in March 2023  We will discuss these findings with Dr. Lamonte Sakai and get back to you on whether or not it is okay to continue with CT surveillance or if he recommends PET scan versus bronchoscopy.  Orders Super D CT chest in 6 months  Follow-up 6 months with Dr. Lamonte Sakai after CT chest

## 2021-12-19 NOTE — Telephone Encounter (Signed)
Please let patient know Dr. Lamonte Sakai is in agreement with plan, we will repeat CT chest imaging in 6 months

## 2021-12-19 NOTE — Telephone Encounter (Signed)
Dr. Lamonte Sakai actually wants Super D CT chest in 3 months, can you update her on timing of repeat chest and change her recall for follow-up to 3 months with him. I update the order to reflect this. Thanks

## 2021-12-19 NOTE — Assessment & Plan Note (Signed)
No acute respiratory symptoms. Dyspnea with moderate exertion. Not on maintenance inhaler. No SABA need.

## 2021-12-19 NOTE — Progress Notes (Signed)
Thank you. Just saw this. Will change timing to 3 months

## 2021-12-19 NOTE — Telephone Encounter (Signed)
Called and spoke with pt letting her know the info per BW after speaking with RB and pt verbalized understanding. Nothing further needed.

## 2021-12-19 NOTE — Assessment & Plan Note (Signed)
Continue Singulair 10mg  at bedtime

## 2021-12-19 NOTE — Assessment & Plan Note (Addendum)
Hx right lower lobe adenocarcinoma status post lobectomy in 2000.  Patient had repeat CT imaging in June that showed multiple nodular densities within both lungs, no significant change when compared to most recent study in March 2023.  No prior studies available.  Radiology recommended PET scan.   Patient does not wish to proceed with additional testing or bronchoscopy at this time. Recommend repeating Super D CT chest in 3 months to monitor nodules for growth

## 2021-12-20 NOTE — Telephone Encounter (Signed)
Called and spoke with pt letting her know the info per BW and she verbalized understanding. Appt scheduled for pt with RB in September. Nothing further needed.

## 2022-01-01 DIAGNOSIS — H353211 Exudative age-related macular degeneration, right eye, with active choroidal neovascularization: Secondary | ICD-10-CM | POA: Diagnosis not present

## 2022-01-01 DIAGNOSIS — H353122 Nonexudative age-related macular degeneration, left eye, intermediate dry stage: Secondary | ICD-10-CM | POA: Diagnosis not present

## 2022-01-07 ENCOUNTER — Ambulatory Visit (INDEPENDENT_AMBULATORY_CARE_PROVIDER_SITE_OTHER): Payer: Medicare Other

## 2022-01-07 ENCOUNTER — Telehealth: Payer: Self-pay

## 2022-01-07 DIAGNOSIS — Z0000 Encounter for general adult medical examination without abnormal findings: Secondary | ICD-10-CM | POA: Diagnosis not present

## 2022-01-07 NOTE — Telephone Encounter (Signed)
Ok to let pt know - none needed since all were done mar 2023 for 90 days and 3 refills

## 2022-01-07 NOTE — Progress Notes (Signed)
I connected with Pamela Terry today by telephone and verified that I am speaking with the correct person using two identifiers. Location patient: home Location provider: work Persons participating in the virtual visit: patient, provider.   I discussed the limitations, risks, security and privacy concerns of performing an evaluation and management service by telephone and the availability of in person appointments. I also discussed with the patient that there may be a patient responsible charge related to this service. The patient expressed understanding and verbally consented to this telephonic visit.    Interactive audio and video telecommunications were attempted between this provider and patient, however failed, due to patient having technical difficulties OR patient did not have access to video capability.  We continued and completed visit with audio only.  Some vital signs may be absent or patient reported.   Time Spent with patient on telephone encounter: 30 minutes  Subjective:   Pamela Terry is a 76 y.o. female who presents for Medicare Annual (Subsequent) preventive examination.  Review of Systems     Cardiac Risk Factors include: advanced age (>33men, >36 women);dyslipidemia;family history of premature cardiovascular disease     Objective:    There were no vitals filed for this visit. There is no height or weight on file to calculate BMI.     01/07/2022    2:52 PM 10/01/2021    8:53 AM 03/30/2021   12:54 PM 08/02/2020    1:59 PM  Advanced Directives  Does Patient Have a Medical Advance Directive? No Yes No No  Would patient like information on creating a medical advance directive? No - Patient declined   No - Patient declined    Current Medications (verified) Outpatient Encounter Medications as of 01/07/2022  Medication Sig   alendronate (FOSAMAX) 70 MG tablet TAKE 1 TABLET BY MOUTH ONCE A WEEK. TAKE WITH A FULL GLASS OF WATER ON AN EMPTY STOMACH.   Apoaequorin  (PREVAGEN) 10 MG CAPS Take 20 mg by mouth daily.   aspirin 81 MG tablet Take 81 mg by mouth daily.   atorvastatin (LIPITOR) 10 MG tablet Take 1 tablet (10 mg total) by mouth daily.   calcium-vitamin D (OSCAL WITH D) 500-200 MG-UNIT tablet Take 1 tablet by mouth daily.   Cholecalciferol (VITAMIN D) 2000 UNITS CAPS Take 1 capsule by mouth daily.   citalopram (CELEXA) 10 MG tablet Take 1 tablet (10 mg total) by mouth daily.   fluticasone (FLONASE) 50 MCG/ACT nasal spray SPRAY 2 SPRAYS INTO EACH NOSTRIL EVERY DAY   LORazepam (ATIVAN) 1 MG tablet Take 1 tablet (1 mg total) by mouth at bedtime as needed.   memantine (NAMENDA) 5 MG tablet TAKE 1 TABLET (5 MG AT NIGHT) FOR 2 WEEKS, THEN INCREASE TO 1 TABLET (5 MG) TWICE A DAY   montelukast (SINGULAIR) 10 MG tablet 1 tab by mouth once daily   QUEtiapine (SEROQUEL) 25 MG tablet Take 1 tablet (25 mg total) by mouth at bedtime.   vitamin B-12 (CYANOCOBALAMIN) 1000 MCG tablet Take 1 tablet (1,000 mcg total) by mouth daily.   No facility-administered encounter medications on file as of 01/07/2022.    Allergies (verified) Augmentin [amoxicillin-pot clavulanate] and Sulfonamide derivatives   History: Past Medical History:  Diagnosis Date   Acute asthmatic bronchitis    Adenocarcinoma, lung (HCC)    Allergic rhinitis    Anxiety    Carcinoma in situ of endocrine gland    Colon polyp    COPD (chronic obstructive pulmonary disease) ([place])  Diverticulosis of colon    History of cervical cancer    Hypercholesterolemia    Microscopic hematuria    Osteoporosis    Past Surgical History:  Procedure Laterality Date   ABDOMINAL HYSTERECTOMY     in her 59's   APPENDECTOMY     TOTAL ABDOMINAL HYSTERECTOMY W/ BILATERAL SALPINGOOPHORECTOMY  1985   Dr Ree Edman for Ca of cervix   Family History  Problem Relation Age of Onset   Asthma Maternal Aunt    Heart disease Father    Hyperlipidemia Father    Stroke Father    Heart disease Paternal Grandfather     Heart disease Maternal Grandmother    Rheum arthritis Mother    Breast cancer Paternal Grandmother    Prostate cancer Maternal Grandfather    Diabetes Brother    Social History   Socioeconomic History   Marital status: Married    Spouse name: Not on file   Number of children: 1   Years of education: Not on file   Highest education level: Not on file  Occupational History   Occupation: Optician, dispensing  Tobacco Use   Smoking status: Former    Packs/day: 2.00    Years: 23.00    Total pack years: 46.00    Types: Cigarettes    Quit date: 06/24/1985    Years since quitting: 36.5   Smokeless tobacco: Never  Vaping Use   Vaping Use: Never used  Substance and Sexual Activity   Alcohol use: No    Alcohol/week: 0.0 standard drinks of alcohol   Drug use: No   Sexual activity: Never  Other Topics Concern   Not on file  Social History Narrative   Regular exercise: walkCaffeine use: 3 sodas daily, sweet tea   Right handed   No caffeine   Social Determinants of Health   Financial Resource Strain: Low Risk  (08/02/2020)   Overall Financial Resource Strain (CARDIA)    Difficulty of Paying Living Expenses: Not hard at all  Food Insecurity: No Food Insecurity (08/02/2020)   Hunger Vital Sign    Worried About Running Out of Food in the Last Year: Never true    Ran Out of Food in the Last Year: Never true  Transportation Needs: No Transportation Needs (08/02/2020)   PRAPARE - Hydrologist (Medical): No    Lack of Transportation (Non-Medical): No  Physical Activity: Sufficiently Active (08/02/2020)   Exercise Vital Sign    Days of Exercise per Week: 5 days    Minutes of Exercise per Session: 30 min  Stress: No Stress Concern Present (08/02/2020)   [place]    Feeling of Stress : Not at all  Social Connections: Socially Integrated (08/02/2020)   Social Connection and Isolation Panel [NHANES]     Frequency of Communication with Friends and Family: More than three times a week    Frequency of Social Gatherings with Friends and Family: Once a week    Attends Religious Services: More than 4 times per year    Active Member of [employer] or Organizations: Yes    Attends Music therapist: More than 4 times per year    Marital Status: Married    Tobacco Counseling Counseling given: Not Answered   Clinical Intake:  Pre-visit preparation completed: Yes  Pain : No/denies pain     BMI - recorded: 28.98 Nutritional Status: BMI 25 -29 Overweight Nutritional Risks: None Diabetes: No  How  often do you need to have someone help you when you read instructions, pamphlets, or other written materials from your doctor or pharmacy?: 1 - Never What is the last grade level you completed in school?: Bachelor's Degree  Diabetic? no  Interpreter Needed?: No  Information entered by :: Lisette Abu, LPN.   Activities of Daily Living    01/07/2022    3:02 PM  In your present state of health, do you have any difficulty performing the following activities:  Hearing? 0  Vision? 1  Comment Patient has macular degeneration; injection in right eye monthly.  Difficulty concentrating or making decisions? 1  Comment dementia  Walking or climbing stairs? 0  Dressing or bathing? 0  Doing errands, shopping? 1  Comment does not drive  Preparing Food and eating ? Y  Using the Toilet? N  In the past six months, have you accidently leaked urine? Y  Do you have problems with loss of bowel control? N  Managing your Medications? N  Managing your Finances? Y  Housekeeping or managing your Housekeeping? N    Patient Care Team: Biagio Borg, MD as PCP - General (Internal Medicine) Warden Fillers, MD as Consulting Physician (Ophthalmology)  Indicate any recent Medical Services you may have received from other than [facility] providers in the past year (date may be approximate).      Assessment:   This is a routine wellness examination for [facility].  Hearing/Vision screen Hearing Screening - Comments:: Patient has hearing difficulty. No hearing aids. Vision Screening - Comments:: Patient does wear corrective lenses/contacts.  Eye exam done by: Princess Bruins, MD.    Dietary issues and exercise activities discussed: Current Exercise Habits: Home exercise routine, Type of exercise: walking, Time (Minutes): 30, Frequency (Times/Week): 5, Weekly Exercise (Minutes/Week): 150, Intensity: Mild, Exercise limited by: respiratory conditions(s);psychological condition(s);neurologic condition(s)   Goals Addressed             This Visit's Progress    None at this time.        Depression Screen    01/07/2022    3:02 PM 09/03/2021    9:59 AM 09/03/2021    9:43 AM 02/23/2021   10:06 AM 08/02/2020    2:00 PM 08/02/2020    1:17 PM 07/16/2019    3:25 PM  PHQ 2/9 Scores  PHQ - 2 Score 0 0 0 1 0 0 0    Fall Risk    01/07/2022    2:54 PM 10/01/2021    8:53 AM 09/03/2021    9:58 AM 09/03/2021    9:42 AM 03/30/2021   12:54 PM  Fall Risk   Falls in the past year? 0 0 0 1 1  Number falls in past yr: 0 0 0 1 1  Injury with Fall? 0 0 0 0 0  Risk for fall due to : No Fall Risks      Follow up Falls evaluation completed        FALL RISK PREVENTION PERTAINING TO THE HOME:  Any stairs in or around the home? Yes  If so, are there any without handrails? No  Home free of loose throw rugs in walkways, pet beds, electrical cords, etc? Yes  Adequate lighting in your home to reduce risk of falls? Yes   ASSISTIVE DEVICES UTILIZED TO PREVENT FALLS:  Life alert? No  Use of a cane, walker or w/c? No  Grab bars in the bathroom? Yes  Shower chair or bench in shower? Yes  Elevated toilet  seat or a handicapped toilet? Yes   TIMED UP AND GO:  Was the test performed? No .  Length of time to ambulate 10 feet: n/a sec.   Appearance of gait: Gait not evaluated during this visit.  Cognitive  Function: Patient has current diagnosis of cognitive impairment.  Patient is followed by neurology for ongoing assessment.       01/07/2022    3:05 PM 10/01/2021    1:00 PM 08/02/2020    1:06 PM  MMSE - Mini Mental State Exam  Not completed: Unable to complete    Orientation to time  4 5  Orientation to Place  5 5  Registration  3 3  Attention/ Calculation  4 5  Recall  3 3  Language- name 2 objects  2 2  Language- repeat  0 1  Language- follow 3 step command  3 3  Language- read & follow direction  1 1  Write a sentence  1 1  Copy design  0 0  Total score  26 29      04/01/2021   11:00 AM  Montreal Cognitive Assessment   Visuospatial/ Executive (0/5) 0  Naming (0/3) 2  Attention: Read list of digits (0/2) 2  Attention: Read list of letters (0/1) 1  Attention: Serial 7 subtraction starting at 100 (0/3) 1  Language: Repeat phrase (0/2) 0  Language : Fluency (0/1) 1  Abstraction (0/2) 1  Delayed Recall (0/5) 5  Orientation (0/6) 5  Total 18  Adjusted Score (based on education) 18      Immunizations Immunization History  Administered Date(s) Administered   Fluad Quad(high Dose 65+) 02/26/2019   H1N1 06/21/2008   Influenza Split 04/05/2011, 04/14/2012, 04/05/2013, 03/28/2014   Influenza Whole 04/02/2010   Influenza, High Dose Seasonal PF 03/23/2018   Influenza,inj,Quad PF,6+ Mos 04/10/2015, 04/15/2016   Influenza-Unspecified 03/24/2017, 03/23/2018, 04/24/2020, 04/02/2021   PFIZER Comirnaty(Gray Top)Covid-19 Tri-Sucrose Vaccine 01/06/2021   PFIZER(Purple Top)SARS-COV-2 Vaccination 07/30/2019, 08/24/2019, 04/24/2020, 02/05/2021   Pneumococcal Conjugate-13 05/14/2016   Pneumococcal Polysaccharide-23 05/18/2012   Tdap 05/20/2011   Zoster, Live 07/08/2011    TDAP status: Due, Education has been provided regarding the importance of this vaccine. Advised may receive this vaccine at local pharmacy or Health Dept. Aware to provide a copy of the vaccination record if  obtained from local pharmacy or Health Dept. Verbalized acceptance and understanding.  Flu Vaccine status: Up to date  Pneumococcal vaccine status: Up to date  Covid-19 vaccine status: Completed vaccines  Qualifies for Shingles Vaccine? Yes   Zostavax completed Yes   Shingrix Completed?: No.    Education has been provided regarding the importance of this vaccine. Patient has been advised to call insurance company to determine out of pocket expense if they have not yet received this vaccine. Advised may also receive vaccine at local pharmacy or Health Dept. Verbalized acceptance and understanding.  Screening Tests Health Maintenance  Topic Date Due   Zoster Vaccines- Shingrix (1 of 2) Never done   COVID-19 Vaccine (6 - Booster for Pfizer series) 04/02/2021   MAMMOGRAM  09/04/2022 (Originally 01/23/2019)   TETANUS/TDAP  09/04/2022 (Originally 05/19/2021)   INFLUENZA VACCINE  01/22/2022   COLONOSCOPY (Pts 45-1yrs Insurance coverage will need to be confirmed)  07/18/2024   Pneumonia Vaccine 49+ Years old  Completed   DEXA SCAN  Completed   Hepatitis C Screening  Completed   HPV VACCINES  Aged Out    Health Maintenance  Health Maintenance Due  Topic Date Due  Zoster Vaccines- Shingrix (1 of 2) Never done   COVID-19 Vaccine (6 - Booster for Pfizer series) 04/02/2021    Colorectal cancer screening: Type of screening: Colonoscopy. Completed 07/19/2019. Repeat every 5 years  Mammogram status: Completed 01/22/2017. Repeat every year  Bone Density status: Completed 08/26/2017. Results reflect: Bone density results: OSTEOPOROSIS. Repeat every 2-3 years.  Lung Cancer Screening: (Low Dose CT Chest recommended if Age 20-80 years, 30 pack-year currently smoking OR have quit w/in 15years.) does not qualify.   Lung Cancer Screening Referral: NO  Additional Screening:  Hepatitis C Screening: does qualify; Completed 05/07/2017  Vision Screening: Recommended annual ophthalmology exams for  early detection of glaucoma and other disorders of the eye. Is the patient up to date with their annual eye exam?  Yes  Who is the provider or what is the name of the office in which the patient attends annual eye exams? Princess Bruins, MD. If pt is not established with a provider, would they like to be referred to a provider to establish care? No .   Dental Screening: Recommended annual dental exams for proper oral hygiene  Community Resource Referral / Chronic Care Management: CRR required this visit?  No   CCM required this visit?  No      Plan:     I have personally reviewed and noted the following in the patient's chart:   Medical and social history Use of alcohol, tobacco or illicit drugs  Current medications and supplements including opioid prescriptions.  Functional ability and status Nutritional status Physical activity Advanced directives List of other physicians Hospitalizations, surgeries, and ER visits in previous 12 months Vitals Screenings to include cognitive, depression, and falls Referrals and appointments  In addition, I have reviewed and discussed with patient certain preventive protocols, quality metrics, and best practice recommendations. A written personalized care plan for preventive services as well as general preventive health recommendations were provided to patient.     Sheral Flow, LPN   [id number]   Nurse Notes:  There is no height or weight on file to calculate BMI. Patient stated that she has no issues with gait or balance; does not use any assistive devices. Medications reviewed with patient; no opioid use noted.

## 2022-01-07 NOTE — Patient Instructions (Signed)
[name] , Thank you for taking time to come for your Medicare Wellness Visit. I appreciate your ongoing commitment to your health goals. Please review the following plan we discussed and let me know if I can assist you in the future.   Screening recommendations/referrals: Colonoscopy: 07/19/2019; due every 5 years Mammogram: 01/22/2017; overdue Bone Density: 08/26/2017; overdue Recommended yearly ophthalmology/optometry visit for glaucoma screening and checkup Recommended yearly dental visit for hygiene and checkup  Vaccinations: Influenza vaccine: 04/02/2021 Pneumococcal vaccine: 05/18/2012, 05/14/2016 Tdap vaccine: 05/20/2011; overdue Shingles vaccine: due/no record Covid-19: 07/30/2019, 08/24/2019, 04/24/2020, 01/06/2021  Advanced directives: No  Conditions/risks identified: Yes  Next appointment: Please schedule your next Medicare Wellness Visit with your Nurse Health Advisor in 1 year by calling [phone].   Preventive Care 75 Years and Older, Female Preventive care refers to lifestyle choices and visits with your health care provider that can promote health and wellness. What does preventive care include? A yearly physical exam. This is also called an annual well check. Dental exams once or twice a year. Routine eye exams. Ask your health care provider how often you should have your eyes checked. Personal lifestyle choices, including: Daily care of your teeth and gums. Regular physical activity. Eating a healthy diet. Avoiding tobacco and drug use. Limiting alcohol use. Practicing safe sex. Taking low-dose aspirin every day. Taking vitamin and mineral supplements as recommended by your health care provider. What happens during an annual well check? The services and screenings done by your health care provider during your annual well check will depend on your age, overall health, lifestyle risk factors, and family history of disease. Counseling  Your health care provider  may ask you questions about your: Alcohol use. Tobacco use. Drug use. Emotional well-being. Home and relationship well-being. Sexual activity. Eating habits. History of falls. Memory and ability to understand (cognition). Work and work Statistician. Reproductive health. Screening  You may have the following tests or measurements: Height, weight, and BMI. Blood pressure. Lipid and cholesterol levels. These may be checked every 5 years, or more frequently if you are over 81 years old. Skin check. Lung cancer screening. You may have this screening every year starting at age 75 if you have a 30-pack-year history of smoking and currently smoke or have quit within the past 15 years. Fecal occult blood test (FOBT) of the stool. You may have this test every year starting at age 4. Flexible sigmoidoscopy or colonoscopy. You may have a sigmoidoscopy every 5 years or a colonoscopy every 10 years starting at age 65. Hepatitis C blood test. Hepatitis B blood test. Sexually transmitted disease (STD) testing. Diabetes screening. This is done by checking your blood sugar (glucose) after you have not eaten for a while (fasting). You may have this done every 1-3 years. Bone density scan. This is done to screen for osteoporosis. You may have this done starting at age 75. Mammogram. This may be done every 1-2 years. Talk to your health care provider about how often you should have regular mammograms. Talk with your health care provider about your test results, treatment options, and if necessary, the need for more tests. Vaccines  Your health care provider may recommend certain vaccines, such as: Influenza vaccine. This is recommended every year. Tetanus, diphtheria, and acellular pertussis (Tdap, Td) vaccine. You may need a Td booster every 10 years. Zoster vaccine. You may need this after age 75. Pneumococcal 13-valent conjugate (PCV13) vaccine. One dose is recommended after age 75. Pneumococcal  polysaccharide (PPSV23) vaccine.  One dose is recommended after age 75. Talk to your health care provider about which screenings and vaccines you need and how often you need them. This information is not intended to replace advice given to you by your health care provider. Make sure you discuss any questions you have with your health care provider. Document Released: 07/07/2015 Document Revised: 02/28/2016 Document Reviewed: 04/11/2015 Elsevier Interactive Patient Education  2017 [place] Prevention in the Home Falls can cause injuries. They can happen to people of all ages. There are many things you can do to make your home safe and to help prevent falls. What can I do on the outside of my home? Regularly fix the edges of walkways and driveways and fix any cracks. Remove anything that might make you trip as you walk through a door, such as a raised step or threshold. Trim any bushes or trees on the path to your home. Use bright outdoor lighting. Clear any walking paths of anything that might make someone trip, such as rocks or tools. Regularly check to see if handrails are loose or broken. Make sure that both sides of any steps have handrails. Any raised decks and porches should have guardrails on the edges. Have any leaves, snow, or ice cleared regularly. Use sand or salt on walking paths during winter. Clean up any spills in your garage right away. This includes oil or grease spills. What can I do in the bathroom? Use night lights. Install grab bars by the toilet and in the tub and shower. Do not use towel bars as grab bars. Use non-skid mats or decals in the tub or shower. If you need to sit down in the shower, use a plastic, non-slip stool. Keep the floor dry. Clean up any water that spills on the floor as soon as it happens. Remove soap buildup in the tub or shower regularly. Attach bath mats securely with double-sided non-slip rug tape. Do not have throw rugs and other  things on the floor that can make you trip. What can I do in the bedroom? Use night lights. Make sure that you have a light by your bed that is easy to reach. Do not use any sheets or blankets that are too big for your bed. They should not hang down onto the floor. Have a firm chair that has side arms. You can use this for support while you get dressed. Do not have throw rugs and other things on the floor that can make you trip. What can I do in the kitchen? Clean up any spills right away. Avoid walking on wet floors. Keep items that you use a lot in easy-to-reach places. If you need to reach something above you, use a strong step stool that has a grab bar. Keep electrical cords out of the way. Do not use floor polish or wax that makes floors slippery. If you must use wax, use non-skid floor wax. Do not have throw rugs and other things on the floor that can make you trip. What can I do with my stairs? Do not leave any items on the stairs. Make sure that there are handrails on both sides of the stairs and use them. Fix handrails that are broken or loose. Make sure that handrails are as long as the stairways. Check any carpeting to make sure that it is firmly attached to the stairs. Fix any carpet that is loose or worn. Avoid having throw rugs at the top or bottom of the  stairs. If you do have throw rugs, attach them to the floor with carpet tape. Make sure that you have a light switch at the top of the stairs and the bottom of the stairs. If you do not have them, ask someone to add them for you. What else can I do to help prevent falls? Wear shoes that: Do not have high heels. Have rubber bottoms. Are comfortable and fit you well. Are closed at the toe. Do not wear sandals. If you use a stepladder: Make sure that it is fully opened. Do not climb a closed stepladder. Make sure that both sides of the stepladder are locked into place. Ask someone to hold it for you, if possible. Clearly  mark and make sure that you can see: Any grab bars or handrails. First and last steps. Where the edge of each step is. Use tools that help you move around (mobility aids) if they are needed. These include: Canes. Walkers. Scooters. Crutches. Turn on the lights when you go into a dark area. Replace any light bulbs as soon as they burn out. Set up your furniture so you have a clear path. Avoid moving your furniture around. If any of your floors are uneven, fix them. If there are any pets around you, be aware of where they are. Review your medicines with your doctor. Some medicines can make you feel dizzy. This can increase your chance of falling. Ask your doctor what other things that you can do to help prevent falls. This information is not intended to replace advice given to you by your health care provider. Make sure you discuss any questions you have with your health care provider. Document Released: 04/06/2009 Document Revised: 11/16/2015 Document Reviewed: 07/15/2014 Elsevier Interactive Patient Education  2017 [employer].

## 2022-01-08 NOTE — Telephone Encounter (Signed)
Patient notified that medications were refilled for year supply in march 2023

## 2022-01-29 DIAGNOSIS — H353211 Exudative age-related macular degeneration, right eye, with active choroidal neovascularization: Secondary | ICD-10-CM | POA: Diagnosis not present

## 2022-01-29 DIAGNOSIS — H353122 Nonexudative age-related macular degeneration, left eye, intermediate dry stage: Secondary | ICD-10-CM | POA: Diagnosis not present

## 2022-01-29 DIAGNOSIS — H43813 Vitreous degeneration, bilateral: Secondary | ICD-10-CM | POA: Diagnosis not present

## 2022-03-04 DIAGNOSIS — H353122 Nonexudative age-related macular degeneration, left eye, intermediate dry stage: Secondary | ICD-10-CM | POA: Diagnosis not present

## 2022-03-04 DIAGNOSIS — H353211 Exudative age-related macular degeneration, right eye, with active choroidal neovascularization: Secondary | ICD-10-CM | POA: Diagnosis not present

## 2022-03-04 DIAGNOSIS — H43813 Vitreous degeneration, bilateral: Secondary | ICD-10-CM | POA: Diagnosis not present

## 2022-03-04 DIAGNOSIS — H35033 Hypertensive retinopathy, bilateral: Secondary | ICD-10-CM | POA: Diagnosis not present

## 2022-03-14 ENCOUNTER — Ambulatory Visit (HOSPITAL_BASED_OUTPATIENT_CLINIC_OR_DEPARTMENT_OTHER)
Admission: RE | Admit: 2022-03-14 | Discharge: 2022-03-14 | Disposition: A | Discharge status: Home / Self Care | Payer: Medicare Other | Source: Ambulatory Visit | Attending: Primary Care | Admitting: Primary Care

## 2022-03-14 DIAGNOSIS — C3490 Malignant neoplasm of unspecified part of unspecified bronchus or lung: Secondary | ICD-10-CM

## 2022-03-14 DIAGNOSIS — R9389 Abnormal findings on diagnostic imaging of other specified body structures: Secondary | ICD-10-CM | POA: Diagnosis not present

## 2022-03-14 DIAGNOSIS — R911 Solitary pulmonary nodule: Secondary | ICD-10-CM | POA: Diagnosis not present

## 2022-03-14 DIAGNOSIS — J439 Emphysema, unspecified: Secondary | ICD-10-CM | POA: Diagnosis not present

## 2022-03-14 DIAGNOSIS — I700 Atherosclerosis of aorta: Secondary | ICD-10-CM | POA: Diagnosis not present

## 2022-03-14 DIAGNOSIS — Z85110 Personal history of malignant carcinoid tumor of bronchus and lung: Secondary | ICD-10-CM | POA: Diagnosis not present

## 2022-03-18 NOTE — Progress Notes (Signed)
You are seeing her tomorrow

## 2022-03-19 ENCOUNTER — Ambulatory Visit: Payer: Medicare Other | Admitting: Emergency Medicine

## 2022-03-19 ENCOUNTER — Encounter: Payer: Self-pay | Admitting: Emergency Medicine

## 2022-03-19 DIAGNOSIS — R9389 Abnormal findings on diagnostic imaging of other specified body structures: Secondary | ICD-10-CM

## 2022-03-19 NOTE — Assessment & Plan Note (Signed)
She is mixed density left upper lobe pulmonary nodules, more solid-appearing right-sided nodule.  All of these look suspicious for possible malignancy.  No significant interval change compared with 3 months ago, question whether the right sided nodule is more prominent.  Discussed the options with her including PET scan, bronchoscopy now.  She wants to be conservative, wait and follow.  She is making this decision in light of her other medical issues, age.  I will support her in this.  We will plan to perform a CT chest/PET scan in December to better characterize the nodules.  At that time we will decide whether bronchoscopy for tissue diagnosis is indicated and consistent with her wishes.

## 2022-03-19 NOTE — Progress Notes (Signed)
Subjective:    Patient ID: Pamela Terry, female    DOB: [birth year], 76 y.o.   MRN: [id number]  HPI 76 year old former smoker (46 pack years) followed in the past in our office by Dr. Lenna Gilford, has also seen Dr. Silas Flood.  She has a history of COPD with chronic bronchitis, right lower lobe adenocarcinoma post lobectomy (2000), allergic rhinitis, cervical cancer.  She was last seen here 04/2021 while she was under evaluation for frontal bone lesion.  She had a chest x-ray 09/03/2021 that showed hazy left upper lobe opacity.  She is referred back now for a subsequent abnormal CT scan of the chest. She is no longer on bronchodilator therapy.  She does use Singulair, fluticasone nasal spray.   CT chest 09/20/2021 reviewed by me shows bilateral calcified thyroid nodules, smooth round 7 mm anterior left lower lobe pulmonary nodule just below the hemidiaphragm that is new since 2011 CT abdomen/pelvis, centrilobular emphysema, 18 mm peripheral left upper lobe spiculated mixed density lesion with some possible associated linear scar that was not mentioned on her CT from 2002 (report only)   ROV 03/19/2022 --76 year old former smoker with a history of COPD and chronic bronchitis, right lower lobe lobectomy for adenocarcinoma, cervical cancer.  I saw her for an abnormal CT scan of the chest in April 2023 that showed left upper lobe mixed density pulmonary nodule with some associated cystic change, possible linear scar, also small rounded benign-appearing 7 mm left basilar nodule.  Overall stable on repeat CT 11/2021.  She underwent a repeat CT chest 03/14/2022 as below.  She has been feeling well. She is not on scheduled BD at this time.   CT chest 03/14/2022 reviewed by me shows stable spiculated and solid nodules in the left chest.  There is some slowly enlarging suspicious right basilar nodularity   Review of Systems As per HPI      Objective:   Physical Exam  Vitals:   03/19/22 1006  BP: 128/76   Pulse: (!) 55  Temp: 98.7 F (37.1 C)  TempSrc: Oral  SpO2: 97%  Weight: 135 lb 6.4 oz (61.4 kg)  Height: 4\' 11"  (1.499 m)    Gen: Pleasant, well-nourished, in no distress,  normal affect  ENT: No lesions,  mouth clear,  oropharynx clear, no postnasal drip  Neck: No JVD, no stridor  Lungs: No use of accessory muscles, no crackles or wheezing on normal respiration, no wheeze on forced expiration  Cardiovascular: RRR, heart sounds normal, no murmur or gallops, no peripheral edema  Musculoskeletal: No deformities, no cyanosis or clubbing  Neuro: alert, awake, non focal  Skin: Warm, no lesions or rash     Assessment & Plan:   Abnormal CT scan, chest She is mixed density left upper lobe pulmonary nodules, more solid-appearing right-sided nodule.  All of these look suspicious for possible malignancy.  No significant interval change compared with 3 months ago, question whether the right sided nodule is more prominent.  Discussed the options with her including PET scan, bronchoscopy now.  She wants to be conservative, wait and follow.  She is making this decision in light of her other medical issues, age.  I will support her in this.  We will plan to perform a CT chest/PET scan in December to better characterize the nodules.  At that time we will decide whether bronchoscopy for tissue diagnosis is indicated and consistent with her wishes.   Baltazar Apo, MD, PhD 03/19/2022, 10:48 AM [facility]  [phone] or if no answer before 7:00PM call [phone] For any issues after 7:00PM please call eLink [phone]

## 2022-03-19 NOTE — Addendum Note (Signed)
Addended by: Gavin Potters R on: 03/19/2022 11:38 AM   Modules accepted: Orders

## 2022-03-19 NOTE — Patient Instructions (Signed)
We reviewed your CT scan of the chest today. We will plan to perform a CT chest/PET scan in December 2023. Follow Dr. Lamonte Sakai in December to review the results together.

## 2022-04-02 ENCOUNTER — Ambulatory Visit (INDEPENDENT_AMBULATORY_CARE_PROVIDER_SITE_OTHER): Payer: Medicare Other | Admitting: Internal Medicine

## 2022-04-02 ENCOUNTER — Encounter: Payer: Self-pay | Admitting: Internal Medicine

## 2022-04-02 VITALS — BP 142/80 | HR 57 | Temp 97.6°F | Ht 59.0 in | Wt 133.0 lb

## 2022-04-02 DIAGNOSIS — R030 Elevated blood-pressure reading, without diagnosis of hypertension: Secondary | ICD-10-CM

## 2022-04-02 DIAGNOSIS — R739 Hyperglycemia, unspecified: Secondary | ICD-10-CM | POA: Diagnosis not present

## 2022-04-02 DIAGNOSIS — G309 Alzheimer's disease, unspecified: Secondary | ICD-10-CM | POA: Diagnosis not present

## 2022-04-02 DIAGNOSIS — F02818 Dementia in other diseases classified elsewhere, unspecified severity, with other behavioral disturbance: Secondary | ICD-10-CM

## 2022-04-02 DIAGNOSIS — Z23 Encounter for immunization: Secondary | ICD-10-CM | POA: Diagnosis not present

## 2022-04-02 MED ORDER — QUETIAPINE FUMARATE 50 MG PO TABS: 50.0000 mg | ORAL_TABLET | Freq: Two times a day (BID) | ORAL | 3 refills | Status: DC

## 2022-04-02 NOTE — Patient Instructions (Addendum)
You had the flu shot today  Please have your shingles shot and Tdap done at the pharmacy  Ok to increase the seroquel to 50 mg twice per day  Please continue all other medications as before, and refills have been done if requested.  Please have the pharmacy call with any other refills you may need.  Please continue your efforts at being more active, low cholesterol diet, and weight control.  You are otherwise up to date with prevention measures today.  Please keep your appointments with your specialists as you may have planned  We can hold on further lab testing today  Please make an Appointment to return in 6 months, or sooner if needed

## 2022-04-02 NOTE — Progress Notes (Signed)
Patient ID: Pamela Terry, female   DOB: [birth year], 76 y.o.   MRN: [id number]        Chief Complaint: follow up HTN, dementia with neighbor daughter-in-law now taking over her meds and oversight       HPI:  Pamela Terry is a 76 y.o. female here as above, has had temper tantrums and slammed doors, loud language in the past 2-3 months worsening, now not taking her medications well, so daughter in law has stepped in strongly and taken responsibility for her direction ;  Pt denies chest pain, increased sob or doe, wheezing, orthopnea, PND, increased LE swelling, palpitations, dizziness or syncope.  . Pt denies polydipsia, polyuria, or new focal neuro s/s.    Pt denies fever, wt loss, night sweats, loss of appetite, or other constitutional symptoms         Wt Readings from Last 3 Encounters:  04/02/22 133 lb (60.3 kg)  03/19/22 135 lb 6.4 oz (61.4 kg)  12/19/21 129 lb 3.2 oz (58.6 kg)   BP Readings from Last 3 Encounters:  04/02/22 (!) 142/80  03/19/22 128/76  12/19/21 (!) 150/76         Past Medical History:  Diagnosis Date   Acute asthmatic bronchitis    Adenocarcinoma, lung (HCC)    Allergic rhinitis    Anxiety    Carcinoma in situ of endocrine gland    Colon polyp    COPD (chronic obstructive pulmonary disease) (HCC)    Diverticulosis of colon    History of cervical cancer    Hypercholesterolemia    Microscopic hematuria    Osteoporosis    Past Surgical History:  Procedure Laterality Date   ABDOMINAL HYSTERECTOMY     in her 30's   APPENDECTOMY     TOTAL ABDOMINAL HYSTERECTOMY W/ BILATERAL SALPINGOOPHORECTOMY  1985   Dr Ree Edman for Ca of cervix    reports that she quit smoking about 36 years ago. Her smoking use included cigarettes. She has a 46.00 pack-year smoking history. She has never used smokeless tobacco. She reports that she does not drink alcohol and does not use drugs. family history includes Asthma in her maternal aunt; Breast cancer in her paternal grandmother;  Diabetes in her brother; Heart disease in her father, maternal grandmother, and paternal grandfather; Hyperlipidemia in her father; Prostate cancer in her maternal grandfather; Rheum arthritis in her mother; Stroke in her father. Allergies  Allergen Reactions   Augmentin [Amoxicillin-Pot Clavulanate]     Severe diarrhea   Sulfonamide Derivatives Hives and Rash   Current Outpatient Medications on File Prior to Visit  Medication Sig Dispense Refill   alendronate (FOSAMAX) 70 MG tablet TAKE 1 TABLET BY MOUTH ONCE A WEEK. TAKE WITH A FULL GLASS OF WATER ON AN EMPTY STOMACH. 12 tablet 3   Apoaequorin (PREVAGEN) 10 MG CAPS Take 20 mg by mouth daily.     aspirin 81 MG tablet Take 81 mg by mouth daily.     atorvastatin (LIPITOR) 10 MG tablet Take 1 tablet (10 mg total) by mouth daily. 90 tablet 3   Cholecalciferol (VITAMIN D) 2000 UNITS CAPS Take 1 capsule by mouth daily.     citalopram (CELEXA) 10 MG tablet Take 1 tablet (10 mg total) by mouth daily. 90 tablet 3   fluticasone (FLONASE) 50 MCG/ACT nasal spray SPRAY 2 SPRAYS INTO EACH NOSTRIL EVERY DAY 48 mL 3   LORazepam (ATIVAN) 1 MG tablet Take 1 tablet (1 mg total) by mouth at bedtime as  needed. 90 tablet 1   memantine (NAMENDA) 5 MG tablet TAKE 1 TABLET (5 MG AT NIGHT) FOR 2 WEEKS, THEN INCREASE TO 1 TABLET (5 MG) TWICE A DAY 180 tablet 1   montelukast (SINGULAIR) 10 MG tablet 1 tab by mouth once daily 90 tablet 3   vitamin B-12 (CYANOCOBALAMIN) 1000 MCG tablet Take 1 tablet (1,000 mcg total) by mouth daily. 90 tablet 1   calcium-vitamin D (OSCAL WITH D) 500-200 MG-UNIT tablet Take 1 tablet by mouth daily.     Fluticasone-Salmeterol 113-14 MCG/ACT AEPB TAKE 2 PUFFS BY MOUTH TWICE A DAY     No current facility-administered medications on file prior to visit.        ROS:  All others reviewed and negative.  Objective        PE:  BP (!) 142/80 (BP Location: Right Arm, Patient Position: Sitting, Cuff Size: Large)   Pulse (!) 57   Temp 97.6 F  (36.4 C) (Oral)   Ht 4\' 11"  (1.499 m)   Wt 133 lb (60.3 kg)   SpO2 93%   BMI 26.86 kg/m                 Constitutional: Pt appears in NAD               HENT: Head: NCAT.                Right Ear: External ear normal.                 Left Ear: External ear normal.                Eyes: . Pupils are equal, round, and reactive to light. Conjunctivae and EOM are normal               Nose: without d/c or deformity               Neck: Neck supple. Gross normal ROM               Cardiovascular: Normal rate and regular rhythm.                 Pulmonary/Chest: Effort normal and breath sounds without rales or wheezing.                Abd:  Soft, NT, ND, + BS, no organomegaly               Neurological: Pt is alert. At baseline orientation, motor grossly intact               Skin: Skin is warm. No rashes, no other new lesions, LE edema - none               Psychiatric: Pt behavior is normal without agitation   Micro: none  Cardiac tracings I have personally interpreted today:  none  Pertinent Radiological findings (summarize): none   Lab Results  Component Value Date   WBC 8.3 09/03/2021   HGB 13.8 09/03/2021   HCT 42.1 09/03/2021   PLT 262.0 09/03/2021   GLUCOSE 77 09/03/2021   CHOL 154 09/03/2021   TRIG 145.0 09/03/2021   HDL 57.20 09/03/2021   LDLCALC 68 09/03/2021   ALT 24 09/03/2021   AST 25 09/03/2021   NA 142 09/03/2021   K 3.9 09/03/2021   CL 105 09/03/2021   CREATININE 0.92 09/03/2021   BUN 14 09/03/2021   CO2 30 09/03/2021   TSH 2.17 09/03/2021  HGBA1C 6.0 09/03/2021   Assessment/Plan:  Pamela Terry is a 76 y.o. White or Caucasian [1] female with  has a past medical history of Acute asthmatic bronchitis, Adenocarcinoma, lung ([place]), Allergic rhinitis, Anxiety, Carcinoma in situ of endocrine gland, Colon polyp, COPD (chronic obstructive pulmonary disease) ([place]), Diverticulosis of colon, History of cervical cancer, Hypercholesterolemia, Microscopic hematuria, and  Osteoporosis.  Dementia, Alzheimer's, with behavior disturbance ([place]) With worsening behavior, for seroquel 50 mg bid  Single episode of elevated blood pressure Most likely reactive today, to continue wt control, low salt, exercise and f/u BP at home  Hyperglycemia Lab Results  Component Value Date   HGBA1C 6.0 09/03/2021   Stable, pt to continue current medical treatment  - diet, wt control, excercise  Followup: Return in about 6 months (around 10/02/2022).  Cathlean Cower, MD 04/02/2022 8:57 PM [facility]

## 2022-04-02 NOTE — Assessment & Plan Note (Signed)
Lab Results  Component Value Date   HGBA1C 6.0 09/03/2021   Stable, pt to continue current medical treatment  - diet, wt control, excercise

## 2022-04-02 NOTE — Assessment & Plan Note (Signed)
With worsening behavior, for seroquel 50 mg bid

## 2022-04-02 NOTE — Progress Notes (Signed)
Assessment/Plan:   Dementia likely due to Vascular etiology   Pamela Terry is a very pleasant 76 y.o. RH female  COPD history of adenocarcinoma of the right lower lobe with lobectomy in 2000, history of cervical cancer in 1985, hypertension, hyperlipidemia, anxiety, depression seen today in follow up for memory loss. Patient is currently on memantine 5 mg bid . Personally reviewed  MRI brain on 03/11/2021 was remarkable for mildly advanced cerebral volume loss for age, severe chronic small vessel ischemic disease. Today's MMSE is 24/30 indicating some decline of the cognitive status.    Follow up in  6 months. Continue Memantine, increase to 10 mg bid  Continue to control mood as per PCP Recommend good control of cardiovascular risk factors.    Referral to speech therapy     Subjective:    This patient is accompanied in the office by her husband who supplements the history.  Previous records as well as any outside records available were reviewed prior to todays visit. Last seen on 10/01/21    Any changes in memory since last visit? "Good and bad days",  SHe reports that is able to remember recent conversation but cannot retrieve the names of people that she knows, Also she states "I know the word but I have a hard time saying it".  repeats oneself?  Endorsed, especially with appointments Disoriented when walking into a room?  Patient denies   Leaving objects in unusual places?  Patient denies, but loses them more often  Ambulates  with difficulty?   Patient denies   Recent falls?  Patient denies   Any head injuries?  Patient denies   History of seizures?   Patient denies   Wandering behavior?  Patient denies   Patient drives?   Patient no longer drives  Any mood changes since last visit?  Patient denies ," Stays in bed a lot and sometimes she is in bad mood, crying a lot ".  Hallucinations?  Patient denies   Paranoia?  Patient denies   Patient reports that sleeps well without  vivid dreams, REM behavior or sleepwalking   History of sleep apnea?  Patient denies   Any hygiene concerns?  Patient denies   Independent of bathing and dressing?  Endorsed  Does the patient needs help with medications? Daughter in [place]  but "she was not taking her meds correctly, PCP adjusted her mood med yesterday" Who is in charge of the finances? Husband is in charge    Any changes in appetite?  Patient denies   Patient have trouble swallowing? Patient denies   Does the patient cook?  She no longer cooks "He don't want me to cook because I left the stove on " Any headaches?  Patient denies   Double vision? Patient denies  but has "eye problems needing injections in the eye" Any focal numbness or tingling?  Patient denies   Chronic back pain Patient denies   Unilateral weakness?  Patient denies   Any tremors?  Only when upset Any history of anosmia?  Patient denies   Any incontinence of urine?  Patient denies but "I use pullups anyway just in case"  Any bowel dysfunction?   Patient denies      Patient lives with: Husband  Initial Visit 03/2021  The patient is seen in neurologic consultation at the request of Biagio Borg, MD for the evaluation of memory.  The patient is accompanied by  who supplements the history.This is a 76 y.o. year  old female who has had memory issues for about 4 years, she states that "does not happen all the time what I cannot remember things ", but when he does, she cannot remember simple task's.  For example, this morning "I forgot how to put the bra ".  Also, she states that she cannot think of the word, but until she gives herself some time, and then "it comes back to me ".  She also reports increasing panic attacks.  She has never seen a psychiatrist, but she admits that about 3 years ago, she has similar symptoms, where she drives and she is afraid of going into the other side of the road.  Also, while driving on [facility] [address], she feels that she is going to get  lost or hit someone, and begins that panic "mode ".  She was "fine for 2 years, and then it hit me again this year ".  Of note, her primary care physician gave her Celexa to try, but refused counseling referral.  She sleeps well and denies hallucinations, sleepwalking.  She denies any vivid dreams.  "My only paranoia is about getting lost and how to keep my car in the lane".  She denies leaving objects in unusual places, but she does leave these objects anywhere in the house and then she cannot remember where she left them.  She denies any issues with bathing and dressing, or medications.  Her husband took over the finances, after she was found over paying "I have an issue with the zeros, I cannot read 100 versus 1000" and has been overpaying.  Her appetite is good, and denies trouble swallowing.  She cooks and denies leaving the stove or the faucet on.  She ambulates without difficulty without the use of a walker or a cane, denies falls or head injuries. Denies headaches, double vision, dizziness, focal numbness or tingling, unilateral weakness or tremors. Denies urine incontinence or retention. Denies constipation or diarrhea.  She denies anosmia, history of OSA, alcohol or tobacco use.  She stopped smoking many years ago, 46-year pack history.  Family history remarkable for brother and mother with vascular dementia.  She has been under significant amount of stress, after taking care of many family members "a lot on my shoulders, including my mother dying with melanoma ".  My brother is also very ill and I am taking care of him, everybody is taking advantage of me".   Labs 2/ 9, 2022 show vitamin B12 679 TSH 1.17    MRI of the brain on 03/11/2021 shows scattered foci of susceptibility throughout the bilateral cerebral hemispheres and right cerebellar hemisphere, nonspecific but can be seen in setting of cerebral amyloid angiopathy. In addition osseous metastatic disease in the setting of a history of lung  cancer but could also represent a focus of fibrous dysplasia. Mildly advanced cerebral volume loss for age, with sequela of severe chronic small vessel ischemic disease in the periventricular white matter and pons was noted.    PREVIOUS MEDICATIONS: rivastigmine, donepezil (GI)  CURRENT MEDICATIONS:  Outpatient Encounter Medications as of 04/03/2022  Medication Sig   alendronate (FOSAMAX) 70 MG tablet TAKE 1 TABLET BY MOUTH ONCE A WEEK. TAKE WITH A FULL GLASS OF WATER ON AN EMPTY STOMACH.   Apoaequorin (PREVAGEN) 10 MG CAPS Take 20 mg by mouth daily.   aspirin 81 MG tablet Take 81 mg by mouth daily.   atorvastatin (LIPITOR) 10 MG tablet Take 1 tablet (10 mg total) by mouth daily.  Cholecalciferol (VITAMIN D) 2000 UNITS CAPS Take 1 capsule by mouth daily.   citalopram (CELEXA) 10 MG tablet Take 1 tablet (10 mg total) by mouth daily.   fluticasone (FLONASE) 50 MCG/ACT nasal spray SPRAY 2 SPRAYS INTO EACH NOSTRIL EVERY DAY   Fluticasone-Salmeterol 113-14 MCG/ACT AEPB TAKE 2 PUFFS BY MOUTH TWICE A DAY   LORazepam (ATIVAN) 1 MG tablet Take 1 tablet (1 mg total) by mouth at bedtime as needed.   memantine (NAMENDA) 5 MG tablet TAKE 1 TABLET (5 MG AT NIGHT) FOR 2 WEEKS, THEN INCREASE TO 1 TABLET (5 MG) TWICE A DAY   montelukast (SINGULAIR) 10 MG tablet 1 tab by mouth once daily   QUEtiapine (SEROQUEL) 50 MG tablet Take 1 tablet (50 mg total) by mouth 2 (two) times daily.   vitamin B-12 (CYANOCOBALAMIN) 1000 MCG tablet Take 1 tablet (1,000 mcg total) by mouth daily.   calcium-vitamin D (OSCAL WITH D) 500-200 MG-UNIT tablet Take 1 tablet by mouth daily.   [DISCONTINUED] QUEtiapine (SEROQUEL) 25 MG tablet Take 1 tablet (25 mg total) by mouth at bedtime.   No facility-administered encounter medications on file as of 04/03/2022.       04/03/2022    9:00 AM 01/07/2022    3:05 PM 10/01/2021    1:00 PM  MMSE - Mini Mental State Exam  Not completed:  Unable to complete   Orientation to time 3  4   Orientation to Place 4  5  Registration 3  3  Attention/ Calculation 3  4  Recall 3  3  Language- name 2 objects 2  2  Language- repeat 0  0  Language- follow 3 step command 2  3  Language- read & follow direction 1  1  Write a sentence 1  1  Copy design 0  0  Total score 22  26      04/01/2021   11:00 AM  Montreal Cognitive Assessment   Visuospatial/ Executive (0/5) 0  Naming (0/3) 2  Attention: Read list of digits (0/2) 2  Attention: Read list of letters (0/1) 1  Attention: Serial 7 subtraction starting at 100 (0/3) 1  Language: Repeat phrase (0/2) 0  Language : Fluency (0/1) 1  Abstraction (0/2) 1  Delayed Recall (0/5) 5  Orientation (0/6) 5  Total 18  Adjusted Score (based on education) 18    Objective:     PHYSICAL EXAMINATION:    VITALS:   Vitals:   04/03/22 0851  BP: (!) 150/78  Pulse: 60  Resp: 18  SpO2: 97%  Weight: 134 lb (60.8 kg)  Height: 4\' 11"  (1.499 m)    GEN:  The patient appears stated age and is in NAD. HEENT:  Normocephalic, atraumatic.   Neurological examination:  General: NAD, well-groomed, appears stated age. Orientation: The patient is alert. Oriented to person, place and not to date Cranial nerves: There is good facial symmetry.The speech is not fluent but clear. No aphasia or dysarthria. Fund of knowledge is appropriate. Recent and remote memory are impaired. Attention and concentration are normal  Able to name objects and repeat phrases.  Hearing is intact to conversational tone.    Sensation: Sensation is intact to light touch throughout Motor: Strength is at least antigravity x4. Tremors: none  DTR's 2/4 in UE/LE     Movement examination: Tone: There is normal tone in the UE/LE Abnormal movements:  no tremor.  No myoclonus.  No asterixis.   Coordination:  There is no decremation with RAM's.  Normal finger to nose  Gait and Station: The patient has no difficulty arising out of a deep-seated chair without the use of the hands.  The patient's stride length is good.  Gait is cautious and narrow.    Thank you for allowing [place] the opportunity to participate in the care of this nice patient. Please do not hesitate to contact us for any questions or concerns.   Total time spent on today's visit was 30 minutes dedicated to this patient today, preparing to see patient, examining the patient, ordering tests and/or medications and counseling the patient, documenting clinical information in the EHR or other health record, independently interpreting results and communicating results to the patient/family, discussing treatment and goals, answering patient's questions and coordinating care.  Cc:  Biagio Borg, MD  Sharene Butters 04/03/2022 9:13 AM

## 2022-04-02 NOTE — Assessment & Plan Note (Signed)
Most likely reactive today, to continue wt control, low salt, exercise and f/u BP at home

## 2022-04-03 ENCOUNTER — Encounter: Payer: Self-pay | Admitting: Physician Assistant

## 2022-04-03 ENCOUNTER — Ambulatory Visit: Payer: Medicare Other | Admitting: Physician Assistant

## 2022-04-03 VITALS — BP 150/78 | HR 60 | Resp 18 | Ht 59.0 in | Wt 134.0 lb

## 2022-04-03 DIAGNOSIS — R413 Other amnesia: Secondary | ICD-10-CM | POA: Diagnosis not present

## 2022-04-03 MED ORDER — MEMANTINE HCL 10 MG PO TABS: ORAL_TABLET | ORAL | 4 refills | Status: DC

## 2022-04-03 NOTE — Patient Instructions (Signed)
It was a pleasure to see you today at our office.   Recommendations:  Follow up in 6  months Recommend referral to speech therapy Increase memantine to 10 mg bid    Whom to call:  Memory  decline, memory medications: Call our office [phone]   For psychiatric meds, mood meds: Please have your primary care physician manage these medications.   Counseling regarding caregiver distress, including caregiver depression, anxiety and issues regarding community resources, adult day care programs, adult living facilities, or memory care questions:   Feel free to contact [place], Social Worker at [phone]   For assessment of decision of mental capacity and competency:  Call Dr. Anthoney Harada, geriatric psychiatrist at [phone]  For guidance in geriatric dementia issues please call [facility] [phone]  For guidance regarding [facility] and if placement were needed at the facility, contact Arnell Asal, Social Worker tel: [phone]  If you have any severe symptoms of a stroke, or other severe issues such as confusion,severe chills or fever, etc call 911 or go to the ER as you may need to be evaluated further   Feel free to visit Facebook page " [email or web address]" for tips of how to care for people with memory problems.        RECOMMENDATIONS FOR ALL PATIENTS WITH MEMORY PROBLEMS: 1. Continue to exercise (Recommend 30 minutes of walking everyday, or 3 hours every week) 2. Increase social interactions - continue going to [place] and enjoy social gatherings with friends and family 3. Eat healthy, avoid fried foods and eat more fruits and vegetables 4. Maintain adequate blood pressure, blood sugar, and blood cholesterol level. Reducing the risk of stroke and cardiovascular disease also helps promoting better memory. 5. Avoid stressful situations. Live a simple life and avoid aggravations. Organize your time and prepare for the next day in  anticipation. 6. Sleep well, avoid any interruptions of sleep and avoid any distractions in the bedroom that may interfere with adequate sleep quality 7. Avoid sugar, avoid sweets as there is a strong link between excessive sugar intake, diabetes, and cognitive impairment We discussed the Mediterranean diet, which has been shown to help patients reduce the risk of progressive memory disorders and reduces cardiovascular risk. This includes eating fish, eat fruits and green leafy vegetables, nuts like almonds and hazelnuts, walnuts, and also use olive oil. Avoid fast foods and fried foods as much as possible. Avoid sweets and sugar as sugar use has been linked to worsening of memory function.  There is always a concern of gradual progression of memory problems. If this is the case, then we may need to adjust level of care according to patient needs. Support, both to the patient and caregiver, should then be put into place.    FALL PRECAUTIONS: Be cautious when walking. Scan the area for obstacles that may increase the risk of trips and falls. When getting up in the mornings, sit up at the edge of the bed for a few minutes before getting out of bed. Consider elevating the bed at the head end to avoid drop of blood pressure when getting up. Walk always in a well-lit room (use night lights in the walls). Avoid area rugs or power cords from appliances in the middle of the walkways. Use a walker or a cane if necessary and consider physical therapy for balance exercise. Get your eyesight checked regularly.  FINANCIAL OVERSIGHT: Supervision, especially oversight when making financial decisions or transactions is also recommended.  HOME SAFETY: Consider the safety of the kitchen when operating appliances like stoves, microwave oven, and blender. Consider having supervision and share cooking responsibilities until no longer able to participate in those. Accidents with firearms and other hazards in the house should  be identified and addressed as well.   ABILITY TO BE LEFT ALONE: If patient is unable to contact 911 operator, consider using LifeLine, or when the need is there, arrange for someone to stay with patients. Smoking is a fire hazard, consider supervision or cessation. Risk of wandering should be assessed by caregiver and if detected at any point, supervision and safe proof recommendations should be instituted.  MEDICATION SUPERVISION: Inability to self-administer medication needs to be constantly addressed. Implement a mechanism to ensure safe administration of the medications.   DRIVING: Regarding driving, in patients with progressive memory problems, driving will be impaired. We advise to have someone else do the driving if trouble finding directions or if minor accidents are reported. Independent driving assessment is available to determine safety of driving.   If you are interested in the driving assessment, you can contact the following:  [employer] in [place]  [place] [phone]  [place]  [facility] [phone] or [phone]

## 2022-04-15 DIAGNOSIS — H353211 Exudative age-related macular degeneration, right eye, with active choroidal neovascularization: Secondary | ICD-10-CM | POA: Diagnosis not present

## 2022-04-15 DIAGNOSIS — H353122 Nonexudative age-related macular degeneration, left eye, intermediate dry stage: Secondary | ICD-10-CM | POA: Diagnosis not present

## 2022-04-24 ENCOUNTER — Other Ambulatory Visit: Payer: Self-pay | Admitting: Physician Assistant

## 2022-05-23 ENCOUNTER — Other Ambulatory Visit: Payer: Self-pay | Admitting: Physician Assistant

## 2022-06-10 DIAGNOSIS — H43813 Vitreous degeneration, bilateral: Secondary | ICD-10-CM | POA: Diagnosis not present

## 2022-06-10 DIAGNOSIS — H353231 Exudative age-related macular degeneration, bilateral, with active choroidal neovascularization: Secondary | ICD-10-CM | POA: Diagnosis not present

## 2022-06-10 DIAGNOSIS — H35033 Hypertensive retinopathy, bilateral: Secondary | ICD-10-CM | POA: Diagnosis not present

## 2022-06-13 ENCOUNTER — Ambulatory Visit: Payer: Medicare Other | Admitting: Emergency Medicine

## 2022-06-24 ENCOUNTER — Other Ambulatory Visit: Payer: Self-pay | Admitting: Physician Assistant

## 2022-07-20 ENCOUNTER — Other Ambulatory Visit: Payer: Self-pay | Admitting: Internal Medicine

## 2022-07-20 ENCOUNTER — Other Ambulatory Visit: Payer: Self-pay | Admitting: Physician Assistant

## 2022-07-20 NOTE — Telephone Encounter (Signed)
Please refill as per office routine med refill policy (all routine meds to be refilled for 3 mo or monthly (per pt preference) up to one year from last visit, then month to month grace period for 3 mo, then further med refills will have to be denied)

## 2022-08-05 DIAGNOSIS — H353231 Exudative age-related macular degeneration, bilateral, with active choroidal neovascularization: Secondary | ICD-10-CM | POA: Diagnosis not present

## 2022-08-22 ENCOUNTER — Encounter: Payer: Self-pay | Admitting: Physician Assistant

## 2022-08-25 ENCOUNTER — Ambulatory Visit
Admission: EM | Admit: 2022-08-25 | Discharge: 2022-08-25 | Disposition: A | Discharge status: Home / Self Care | Payer: Medicare Other | Attending: Urgent Care | Admitting: Urgent Care

## 2022-08-25 DIAGNOSIS — B3789 Other sites of candidiasis: Secondary | ICD-10-CM | POA: Diagnosis not present

## 2022-08-25 MED ORDER — FLUCONAZOLE 150 MG PO TABS: 150.0000 mg | ORAL_TABLET | ORAL | 0 refills | Status: DC

## 2022-08-25 MED ORDER — KETOCONAZOLE 2 % EX CREA: 1.0000 | TOPICAL_CREAM | Freq: Every day | CUTANEOUS | 0 refills | Status: DC

## 2022-08-25 NOTE — ED Provider Notes (Signed)
[facility]  Note:  This document was prepared using Systems analyst and may include unintentional dictation errors.  MRN: [id number] DOB: 11-12-1945  Subjective:   RUWAIDA WILDING is a 77 y.o. female presenting for 1 day history of redness, itching, irritation and slight weeping under the left breast.  Has prediabetes.  No current facility-administered medications for this encounter.  Current Outpatient Medications:    alendronate (FOSAMAX) 70 MG tablet, TAKE 1 TABLET BY MOUTH ONCE A WEEK. TAKE WITH A FULL GLASS OF WATER ON AN EMPTY STOMACH., Disp: 12 tablet, Rfl: 3   Apoaequorin (PREVAGEN) 10 MG CAPS, Take 20 mg by mouth daily., Disp: , Rfl:    aspirin 81 MG tablet, Take 81 mg by mouth daily., Disp: , Rfl:    atorvastatin (LIPITOR) 10 MG tablet, TAKE 1 TABLET BY MOUTH EVERY DAY, Disp: 90 tablet, Rfl: 3   calcium-vitamin D (OSCAL WITH D) 500-200 MG-UNIT tablet, Take 1 tablet by mouth daily., Disp: , Rfl:    Cholecalciferol (VITAMIN D) 2000 UNITS CAPS, Take 1 capsule by mouth daily., Disp: , Rfl:    citalopram (CELEXA) 10 MG tablet, Take 1 tablet (10 mg total) by mouth daily., Disp: 90 tablet, Rfl: 3   fluticasone (FLONASE) 50 MCG/ACT nasal spray, SPRAY 2 SPRAYS INTO EACH NOSTRIL EVERY DAY, Disp: 48 mL, Rfl: 3   Fluticasone-Salmeterol 113-14 MCG/ACT AEPB, TAKE 2 PUFFS BY MOUTH TWICE A DAY, Disp: , Rfl:    LORazepam (ATIVAN) 1 MG tablet, Take 1 tablet (1 mg total) by mouth at bedtime as needed., Disp: 90 tablet, Rfl: 1   memantine (NAMENDA) 10 MG tablet, Take 1 tablet  10 mg 2 times a day, Disp: 180 tablet, Rfl: 4   memantine (NAMENDA) 5 MG tablet, TAKE 1 TABLET (5 MG AT NIGHT) FOR 2 WEEKS, THEN INCREASE TO 1 TABLET (5 MG) TWICE A DAY, Disp: 180 tablet, Rfl: 1   montelukast (SINGULAIR) 10 MG tablet, 1 tab by mouth once daily, Disp: 90 tablet, Rfl: 3   QUEtiapine (SEROQUEL) 50 MG tablet, Take 1 tablet (50 mg total) by mouth 2 (two) times daily.,  Disp: 180 tablet, Rfl: 3   vitamin B-12 (CYANOCOBALAMIN) 1000 MCG tablet, Take 1 tablet (1,000 mcg total) by mouth daily., Disp: 90 tablet, Rfl: 1   Allergies  Allergen Reactions   Augmentin [Amoxicillin-Pot Clavulanate]     Severe diarrhea   Sulfonamide Derivatives Hives and Rash    Past Medical History:  Diagnosis Date   Acute asthmatic bronchitis    Adenocarcinoma, lung (HCC)    Allergic rhinitis    Anxiety    Carcinoma in situ of endocrine gland    Colon polyp    COPD (chronic obstructive pulmonary disease) (HCC)    Diverticulosis of colon    History of cervical cancer    Hypercholesterolemia    Microscopic hematuria    Osteoporosis      Past Surgical History:  Procedure Laterality Date   ABDOMINAL HYSTERECTOMY     in her 32's   APPENDECTOMY     TOTAL ABDOMINAL HYSTERECTOMY W/ BILATERAL SALPINGOOPHORECTOMY  1985   Dr Ree Edman for Ca of cervix    Family History  Problem Relation Age of Onset   Asthma Maternal Aunt    Heart disease Father    Hyperlipidemia Father    Stroke Father    Heart disease Paternal Grandfather    Heart disease Maternal Grandmother    Rheum arthritis Mother  Breast cancer Paternal Grandmother    Prostate cancer Maternal Grandfather    Diabetes Brother     Social History   Tobacco Use   Smoking status: Former    Packs/day: 2.00    Years: 23.00    Total pack years: 46.00    Types: Cigarettes    Quit date: 06/24/1985    Years since quitting: 37.1   Smokeless tobacco: Never  Vaping Use   Vaping Use: Never used  Substance Use Topics   Alcohol use: No    Alcohol/week: 0.0 standard drinks of alcohol   Drug use: No    ROS   Objective:   Vitals: BP (!) 186/68 (BP Location: Left Arm)   Pulse 64   Temp 98.4 F (36.9 C) (Oral)   Resp 18   SpO2 94%   Physical Exam Constitutional:      General: She is not in acute distress.    Appearance: Normal appearance. She is well-developed. She is not ill-appearing, toxic-appearing or  diaphoretic.  HENT:     Head: Normocephalic and atraumatic.     Nose: Nose normal.     Mouth/Throat:     Mouth: Mucous membranes are moist.  Eyes:     General: No scleral icterus.       Right eye: No discharge.        Left eye: No discharge.     Extraocular Movements: Extraocular movements intact.  Cardiovascular:     Rate and Rhythm: Normal rate.  Pulmonary:     Effort: Pulmonary effort is normal.  Chest:    Skin:    General: Skin is warm and dry.  Neurological:     General: No focal deficit present.     Mental Status: She is alert and oriented to person, place, and time.  Psychiatric:        Mood and Affect: Mood normal.        Behavior: Behavior normal.     Assessment and Plan :   PDMP not reviewed this encounter.  1. Candidiasis of breast     Recommended 2 doses of oral fluconazole, ketoconazole cream.  Low suspicion for cellulitis given the area in question and more commonly this area is affected by infection.  Consider antibiotic use if symptoms fail to improve.  Low suspicion for shingles. Counseled patient on potential for adverse effects with medications prescribed/recommended today, ER and return-to-clinic precautions discussed, patient verbalized understanding.    Jaynee Eagles, PA-C 08/25/22 1139

## 2022-08-25 NOTE — ED Triage Notes (Signed)
Pt c/o rash under left breast noticed this am-NAD-steady gait

## 2022-08-27 DIAGNOSIS — H1045 Other chronic allergic conjunctivitis: Secondary | ICD-10-CM | POA: Diagnosis not present

## 2022-08-27 DIAGNOSIS — D3131 Benign neoplasm of right choroid: Secondary | ICD-10-CM | POA: Diagnosis not present

## 2022-08-27 DIAGNOSIS — Z961 Presence of intraocular lens: Secondary | ICD-10-CM | POA: Diagnosis not present

## 2022-08-27 DIAGNOSIS — H02831 Dermatochalasis of right upper eyelid: Secondary | ICD-10-CM | POA: Diagnosis not present

## 2022-08-27 DIAGNOSIS — H02834 Dermatochalasis of left upper eyelid: Secondary | ICD-10-CM | POA: Diagnosis not present

## 2022-09-03 ENCOUNTER — Ambulatory Visit (INDEPENDENT_AMBULATORY_CARE_PROVIDER_SITE_OTHER): Payer: Medicare Other | Admitting: Internal Medicine

## 2022-09-03 ENCOUNTER — Encounter: Payer: Self-pay | Admitting: Internal Medicine

## 2022-09-03 VITALS — BP 144/82 | HR 78 | Temp 97.9°F | Ht 59.0 in | Wt 138.8 lb

## 2022-09-03 DIAGNOSIS — J449 Chronic obstructive pulmonary disease, unspecified: Secondary | ICD-10-CM | POA: Diagnosis not present

## 2022-09-03 DIAGNOSIS — I10 Essential (primary) hypertension: Secondary | ICD-10-CM | POA: Diagnosis not present

## 2022-09-03 DIAGNOSIS — K219 Gastro-esophageal reflux disease without esophagitis: Secondary | ICD-10-CM | POA: Diagnosis not present

## 2022-09-03 MED ORDER — FAMOTIDINE 20 MG PO TABS: 20.0000 mg | ORAL_TABLET | Freq: Every day | ORAL | 1 refills | Status: DC

## 2022-09-03 MED ORDER — AMLODIPINE BESYLATE 5 MG PO TABS: 5.0000 mg | ORAL_TABLET | Freq: Every day | ORAL | 5 refills | Status: DC

## 2022-09-03 MED ORDER — TRELEGY ELLIPTA 100-62.5-25 MCG/ACT IN AEPB: 1.0000 | INHALATION_SPRAY | Freq: Every day | RESPIRATORY_TRACT | 3 refills | Status: DC

## 2022-09-03 NOTE — Assessment & Plan Note (Signed)
Chronic Has been on an inhaler in the past, but currently not using one States chronic cough, wheeze and dyspnea with exertion Interested in retrying an inhaler to see if that helps with her symptoms Trial of Trelegy 1 puff daily Instructed to rinse mouth after use Discussed that if she does not tolerate this or is too expensive we can consider a different inhaler Follow-up with PCP in 3-4 weeks

## 2022-09-03 NOTE — Progress Notes (Signed)
Subjective:    Patient ID: Pamela Terry, female    DOB: [birth year], 77 y.o.   MRN: [id number]      HPI Pamela Terry is here for No chief complaint on file.   BP elevated x 2 weeks.  Went to urgent care for rash and BP was high.  Sunday it was high at [employer].  Has checked it at home and it was elevated.  182/59.  Wonders why her blood pressure is elevated.   Feels GERD-daughter states she complains of this often.  She takes an over-the-counter medication-May be the generic for Tagamet but she is not sure.  She only takes medication as needed.   Has copd,  SOB with exertion and wheezing at times.  She was on inhaler at 1 point, but does not use an inhaler now.  She does have a cough, but it is primarily dry.  Her daughter is most concerned about the wheezing.  Medications and allergies reviewed with patient and updated if appropriate.  Current Outpatient Medications on File Prior to Visit  Medication Sig Dispense Refill   alendronate (FOSAMAX) 70 MG tablet TAKE 1 TABLET BY MOUTH ONCE A WEEK. TAKE WITH A FULL GLASS OF WATER ON AN EMPTY STOMACH. 12 tablet 3   Apoaequorin (PREVAGEN) 10 MG CAPS Take 20 mg by mouth daily.     aspirin 81 MG tablet Take 81 mg by mouth daily.     atorvastatin (LIPITOR) 10 MG tablet TAKE 1 TABLET BY MOUTH EVERY DAY 90 tablet 3   Cholecalciferol (VITAMIN D) 2000 UNITS CAPS Take 1 capsule by mouth daily.     citalopram (CELEXA) 10 MG tablet Take 1 tablet (10 mg total) by mouth daily. 90 tablet 3   fluconazole (DIFLUCAN) 150 MG tablet Take 1 tablet (150 mg total) by mouth once a week. 2 tablet 0   fluticasone (FLONASE) 50 MCG/ACT nasal spray SPRAY 2 SPRAYS INTO EACH NOSTRIL EVERY DAY 48 mL 3   Fluticasone-Salmeterol 113-14 MCG/ACT AEPB TAKE 2 PUFFS BY MOUTH TWICE A DAY     ketoconazole (NIZORAL) 2 % cream Apply 1 Application topically daily. 60 g 0   LORazepam (ATIVAN) 1 MG tablet Take 1 tablet (1 mg total) by mouth at bedtime as needed. 90 tablet 1    memantine (NAMENDA) 10 MG tablet Take 1 tablet  10 mg 2 times a day 180 tablet 4   memantine (NAMENDA) 5 MG tablet TAKE 1 TABLET (5 MG AT NIGHT) FOR 2 WEEKS, THEN INCREASE TO 1 TABLET (5 MG) TWICE A DAY 180 tablet 1   montelukast (SINGULAIR) 10 MG tablet 1 tab by mouth once daily 90 tablet 3   QUEtiapine (SEROQUEL) 50 MG tablet Take 1 tablet (50 mg total) by mouth 2 (two) times daily. 180 tablet 3   vitamin B-12 (CYANOCOBALAMIN) 1000 MCG tablet Take 1 tablet (1,000 mcg total) by mouth daily. 90 tablet 1   No current facility-administered medications on file prior to visit.    Review of Systems  Respiratory:  Positive for cough, shortness of breath and wheezing.   Cardiovascular:  Positive for chest pain (with eating - mid chest). Negative for palpitations and leg swelling.  Gastrointestinal:  Positive for abdominal pain (once in a while). Negative for nausea.       Gerd frequent  Neurological:  Positive for headaches (occ - frontal). Negative for dizziness and light-headedness.       Objective:   Vitals:   09/03/22 1134 09/03/22 1202  BP: (!) 148/80 (!) 144/82  Pulse: 78   Temp: 97.9 F (36.6 C)   SpO2: 94%    BP Readings from Last 3 Encounters:  09/03/22 (!) 144/82  08/25/22 (!) 186/68  04/03/22 (!) 150/78   Wt Readings from Last 3 Encounters:  09/03/22 138 lb 12.8 oz (63 kg)  04/03/22 134 lb (60.8 kg)  04/02/22 133 lb (60.3 kg)   Body mass index is 28.03 kg/m.    Physical Exam Constitutional:      General: She is not in acute distress.    Appearance: Normal appearance.  HENT:     Head: Normocephalic and atraumatic.  Eyes:     Conjunctiva/sclera: Conjunctivae normal.  Cardiovascular:     Rate and Rhythm: Normal rate and regular rhythm.     Heart sounds: Normal heart sounds.  Pulmonary:     Effort: Pulmonary effort is normal. No respiratory distress.     Breath sounds: Normal breath sounds. No wheezing.  Musculoskeletal:     Cervical back: Neck supple.      Right lower leg: No edema.     Left lower leg: No edema.  Lymphadenopathy:     Cervical: No cervical adenopathy.  Skin:    General: Skin is warm and dry.     Findings: No rash.  Neurological:     Mental Status: She is alert. Mental status is at baseline.  Psychiatric:        Mood and Affect: Mood normal.        Behavior: Behavior normal.            Assessment & Plan:    See Problem List for Assessment and Plan of chronic medical problems.

## 2022-09-03 NOTE — Assessment & Plan Note (Signed)
Having frequent heartburn Taking an over-the-counter medication as needed Start Pepcid 20 mg daily Follow-up with PCP in 3-4 weeks

## 2022-09-03 NOTE — Assessment & Plan Note (Signed)
Blood pressure elevated on several occasions Discussed causes of hypertension Advised healthy diet, low-sodium diet Start amlodipine 5 mg daily Follow-up with PCP in 3-4 weeks Advised to monitor blood pressure at home

## 2022-09-03 NOTE — Patient Instructions (Addendum)
        Medications changes include :   amlodipine 5 mg daily ( for blood pressure),  famotidine - pepcid 20 mg daily ( heartburn) daily, trelegy inhaler 1 puff once a day (COPD)     Return in about 3 weeks (around 09/24/2022) for 3-4 weeks - follow up with PCP.

## 2022-09-14 IMAGING — CT CT CHEST W/O CM
1 of 2 series · 14 of 32 positions shown, 18 images · non-contrast
Comparison: Chest radiographs 09/03/2021 and earlier.

CLINICAL DATA: 75-year-old female with remote history of lung
carcinoma. Indeterminate left upper lobe opacity on chest
radiographs earlier this month.



[Series 6: super d · axial · 0.63mm/px · z∈[-156,+127]mm · 14 of 396 slices shown, 18 images]
[im 21/396  mediastinal]
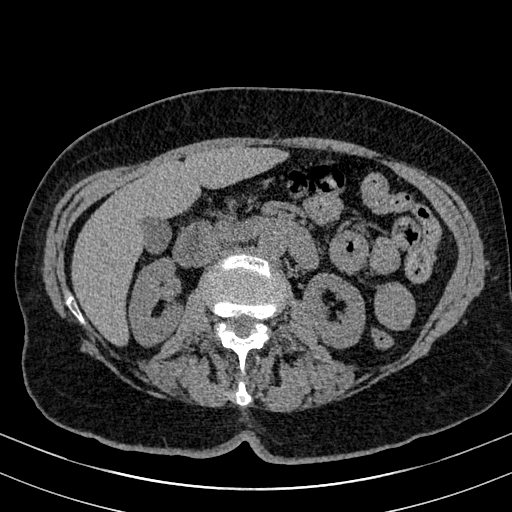
[im 21/396  lung]
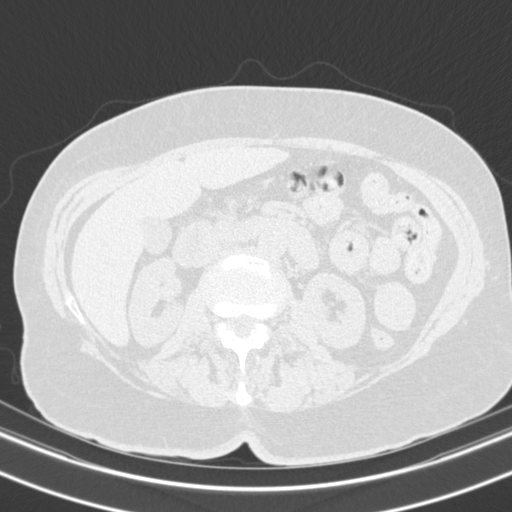
[im 63/396  lung]
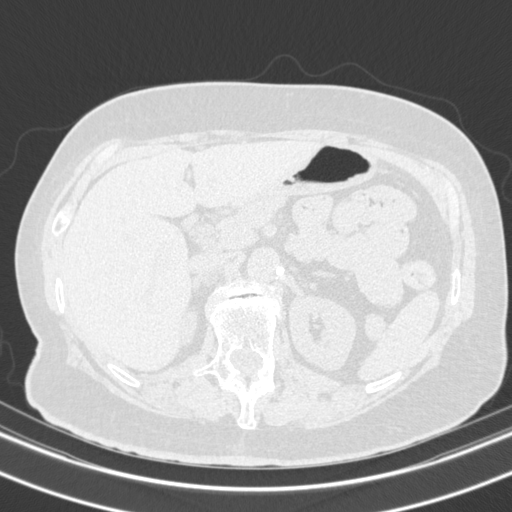
[im 84/396  lung]
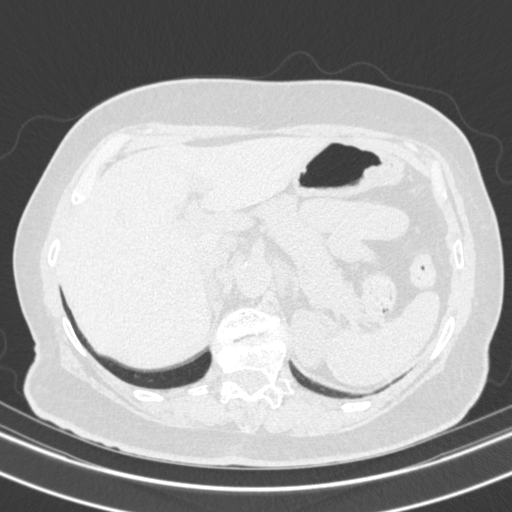
[im 125/396  lung]
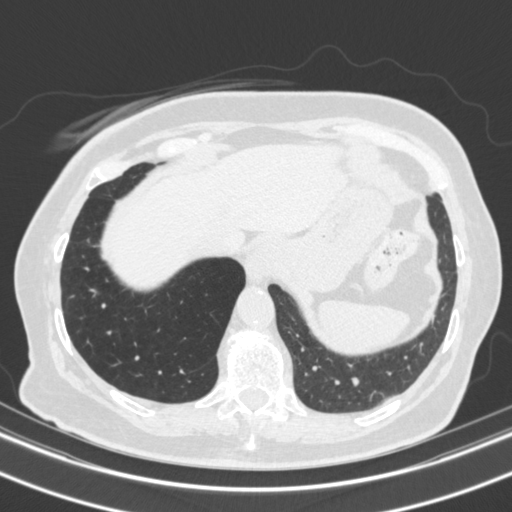
[im 132/396  mediastinal]
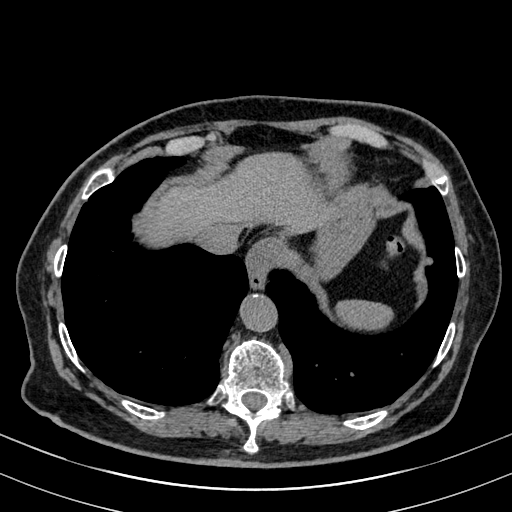
[im 132/396  lung]
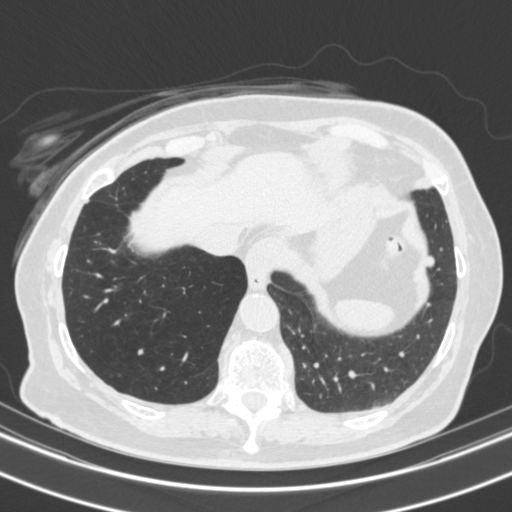
[im 167/396  lung]
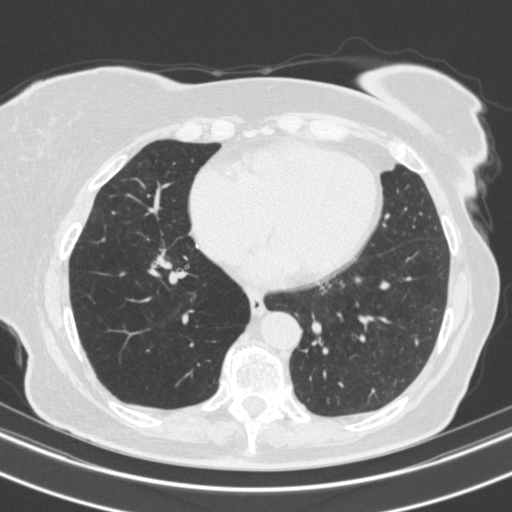
[im 186/396  lung]
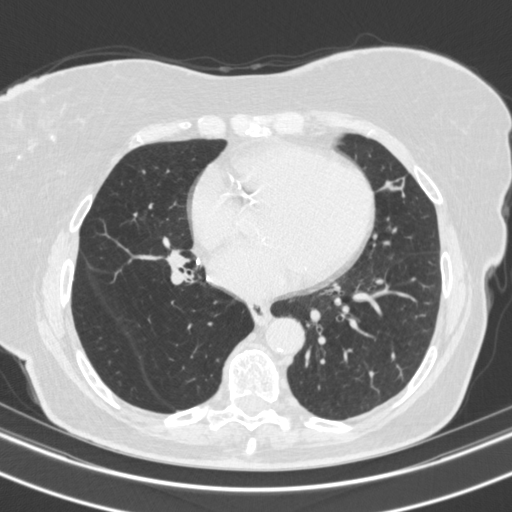
[im 208/396  lung]
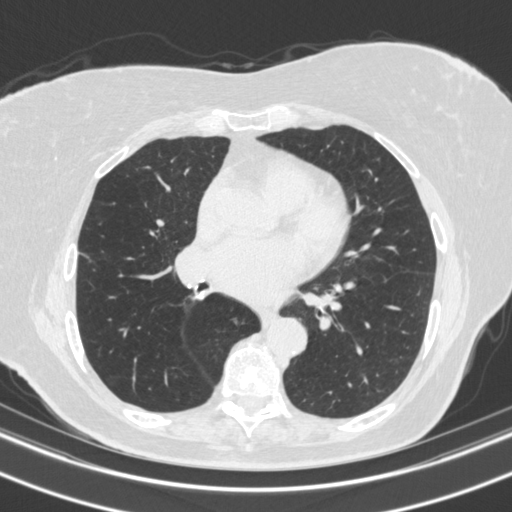
[im 229/396  mediastinal]
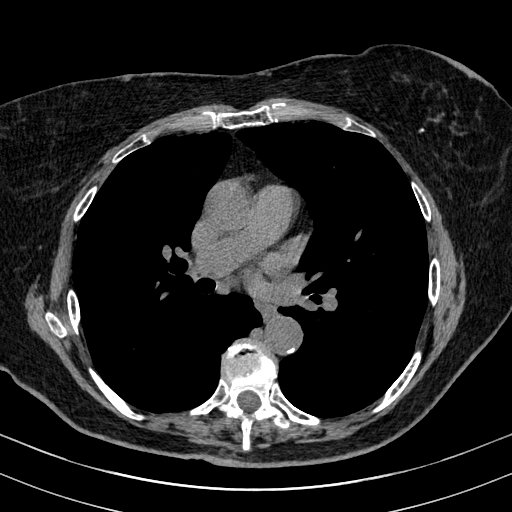
[im 229/396  lung]
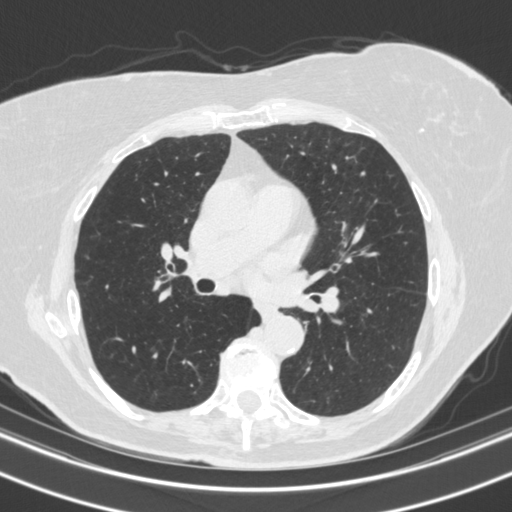
[im 264/396  lung]
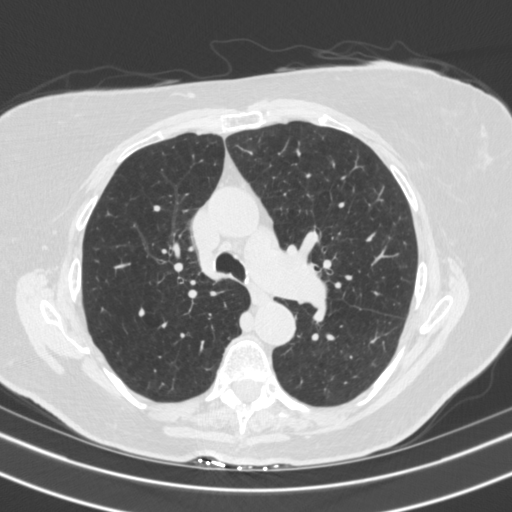
[im 271/396  lung]
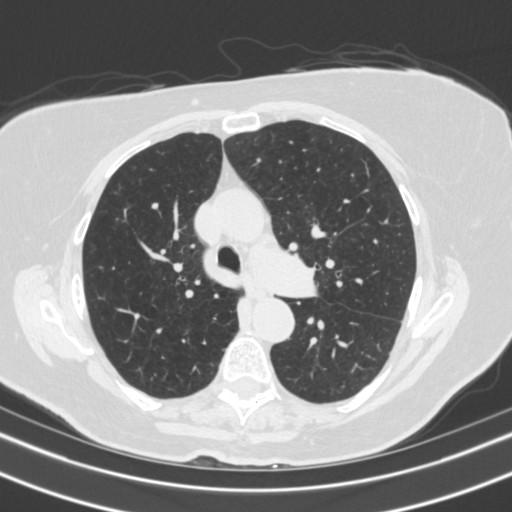
[im 312/396  lung]
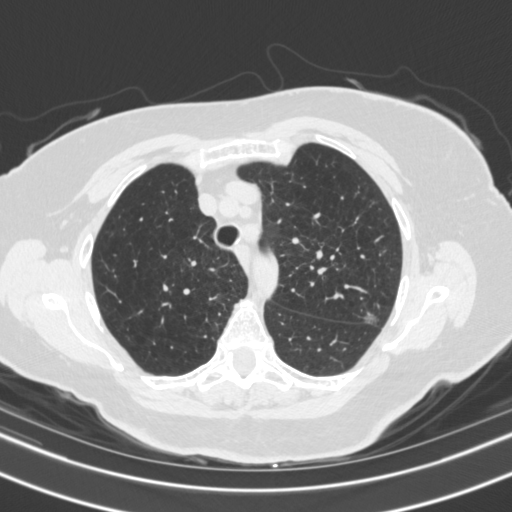
[im 333/396  mediastinal]
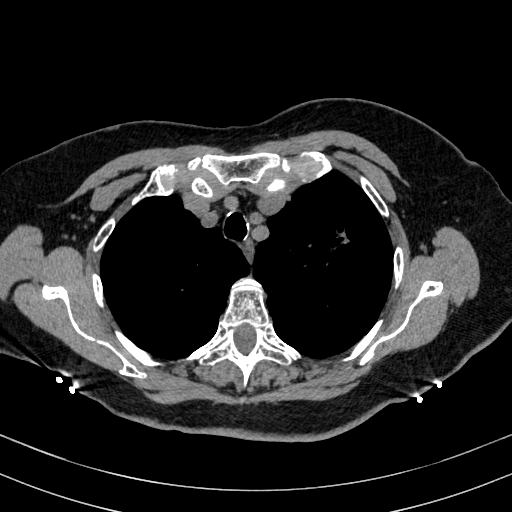
[im 333/396  lung]
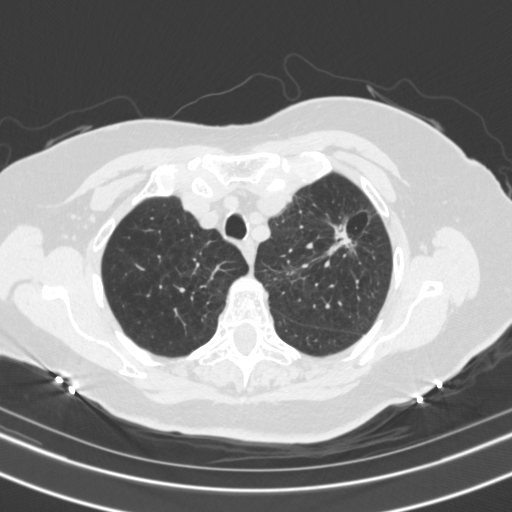
[im 375/396  lung]
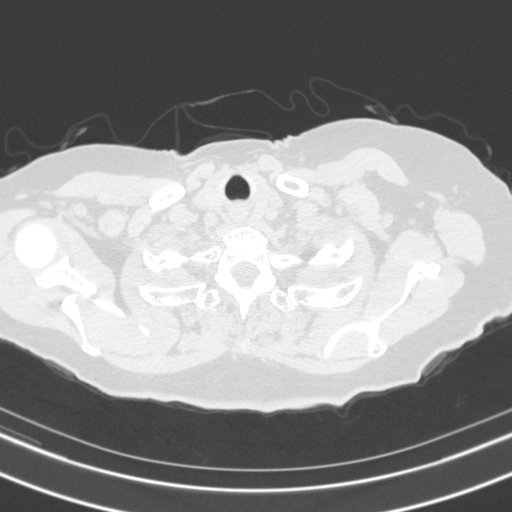

[14 of 32 positions shown; findings below may reference images not displayed]

Chest CT
report 11/14/2000 (no images available). CT Abdomen and Pelvis
07/26/2009.
FINDINGS: Cardiovascular: Calcified coronary artery atherosclerosis and/or
stents. No cardiomegaly or pericardial effusion. Mildly tortuous
thoracic aorta. Calcified aortic atherosclerosis.

Mediastinum/Nodes: No mediastinal mass or lymphadenopathy.

Coarsely calcified 9 mm right thyroid nodule and contralateral
partially calcified 10 mm thyroid nodule. Not clinically
significant; no follow-up imaging recommended (ref: [HOSPITAL]. [DATE]): 143-50).

Lungs/Pleura: Smooth round 7 mm anterior basal segment left lower
lobe lung nodule just above the diaphragm (series 5, image 105) is
new since 2955.

Centrilobular emphysema.

Spiculated mixed solid and sub solid 18 mm peripheral left upper
lobe lesion on series 5, image 24 was not described on the 6886
chest CT. Along the medial apical upper lobe from that lesion is a
smaller more sub solid area (series 5, image 21 and series 3, image
79) which more resembles conventional lung scarring. These
correspond to the recent radiographic finding.

Major airways are patent. There is mild curvilinear scarring in the
lingula. Large lung volumes. No pleural effusion or other suspicious
pulmonary opacity.

Upper Abdomen: Central hypodensity in the visible noncontrast liver
on series 2, image 120 corresponds to the area of a benign hepatic
hemangioma on the 2955 CT Abdomen and Pelvis (no imaging follow-up
recommended). Otherwise negative visible noncontrast liver,
gallbladder, spleen, pancreas, adrenal glands, kidneys, and bowel in
the upper abdomen.

Musculoskeletal: Osteopenia. No acute or suspicious osseous lesion
identified.
IMPRESSION: 1. Emphysema (EBSF2-UNT.B).
Spiculated mixed solid and sub-solid 18 mm peripheral left upper
lobe lesion corresponds to the recent radiographic finding, and was
not described on a remote Chest CT in this system (6886).
Round 7 mm anterior basal segment left lower lobe lung nodule is new
since 2955.
Consider referral to [REDACTED]
([facility]) AND one of the following in 3 months: (a) repeat Chest CT,
(b) follow-up PET-CT, or (c) tissue sampling. This recommendation
follows the consensus statement: Guidelines for Management of
Incidental Pulmonary Nodules Detected on CT Images: From the

2. Benign hemangioma suspected in the central liver (visible in
2955).

3. Calcified coronary artery and Aortic Atherosclerosis
(EBSF2-OBX.X).

## 2022-09-19 ENCOUNTER — Other Ambulatory Visit: Payer: Self-pay | Admitting: Internal Medicine

## 2022-09-24 ENCOUNTER — Ambulatory Visit: Payer: Medicare Other | Admitting: Internal Medicine

## 2022-09-27 DIAGNOSIS — H353231 Exudative age-related macular degeneration, bilateral, with active choroidal neovascularization: Secondary | ICD-10-CM | POA: Diagnosis not present

## 2022-09-27 DIAGNOSIS — H43813 Vitreous degeneration, bilateral: Secondary | ICD-10-CM | POA: Diagnosis not present

## 2022-09-27 DIAGNOSIS — H35033 Hypertensive retinopathy, bilateral: Secondary | ICD-10-CM | POA: Diagnosis not present

## 2022-10-03 ENCOUNTER — Ambulatory Visit: Payer: Medicare Other | Admitting: Physician Assistant

## 2022-10-18 ENCOUNTER — Ambulatory Visit: Payer: Medicare Other | Admitting: Physician Assistant

## 2022-10-18 ENCOUNTER — Encounter: Payer: Self-pay | Admitting: Physician Assistant

## 2022-10-18 VITALS — BP 145/85 | HR 60 | Ht 59.0 in | Wt 143.0 lb

## 2022-10-18 DIAGNOSIS — F0150 Vascular dementia without behavioral disturbance: Secondary | ICD-10-CM | POA: Diagnosis not present

## 2022-10-18 MED ORDER — MEMANTINE HCL 10 MG PO TABS: ORAL_TABLET | ORAL | 4 refills | Status: DC

## 2022-10-18 NOTE — Progress Notes (Signed)
Assessment/Plan:   Dementia likely of vascular etiology.  Pamela Terry is a very pleasant 77 y.o. RH female with a history of COPD, adenocarcinoma of the right lower lobe with lobectomy in 2000, history of cervical cancer 1985, hypertension, hyperlipidemia, anxiety, depression seen today in follow up for memory loss. Patient is currently on memantine 10 mg twice daily.  Prior MRI brain personally reviewed was remarkable for mildly advanced cerebral volume loss for age, severe chronic small vessel ischemic disease. Her MMSE today is 19/30, worse from prior on Oct 2023 at  22/30. Unfortunately, she had severe GI side effects with donepezil and rivastigmine, unable to add these meds in the regimen. She seems to have more difficulty with fluency today, and ST was recommended, she declines.       Follow up in 6  months. Continue memantine 10 mg twice daily, side effects were discussed Recommend good control of her cardiovascular risk factors Patient not interested in speech therapy.  Continue to control mood as per PCP, patient not interested in psychotherapy    Subjective:    This patient is accompanied in the office by her husband who supplements the history.  Previous records as well as any outside records available were reviewed prior to todays visit. Patient was last seen on 04/03/2022 at which time her MMSE was 22/30    Any changes in memory since last visit?  "Good and bad days ". She may have more difficulty with recent conversations, but cannot retrieve the names of people that she knows.  As prior, she states "I know the word, but I have a hard time saying it ". Does not like to do brain games repeats oneself?  Endorsed, especially with appointments Disoriented when walking into a room?  Patient denies except occasionally not remembering what patient came to the room for   Leaving objects in unusual places?  denies   Wandering behavior?  denies   Any personality changes since  last visit?  As before for her, she has some crying spells at which time she spends her time in bed. "I think is because I have to depend on everybody" Any worsening depression?:  denies   Hallucinations or paranoia?  denies   Seizures?    denies    Any sleep changes?  Denies vivid dreams, REM behavior or sleepwalking   Sleep apnea?   denies   Any hygiene concerns?    denies   Independent of bathing and dressing?  Endorsed  Does the patient needs help with medications?  Daughter-in-law  is in charge   Who is in charge of the finances?  Husband is in charge     Any changes in appetite? Eating more now.  Patient have trouble swallowing?  denies   Does the patient cook?  She no longer cooks after leaving the stove on in the past.   Any headaches?   denies   Chronic back pain  denies   Ambulates with difficulty?  Walks "A little slower", no shuffling . Walks daily  Recent falls or head injuries? denies     Unilateral weakness, numbness or tingling?    denies   Any tremors?  denies   Any anosmia?  Patient denies   Any incontinence of urine?  denies, but uses pull-ups "just in case " Any bowel dysfunction? denies      Patient lives with her husband  Does the patient drive? No longer drives   Initial Visit 03/2021  The patient  is seen in neurologic consultation at the request of Corwin Levins, MD for the evaluation of memory.  The patient is accompanied by  who supplements the history.This is a 77 y.o. year old female who has had memory issues for about 4 years, she states that "does not happen all the time what I cannot remember things ", but when he does, she cannot remember simple task's.  For example, this morning "I forgot how to put the bra ".  Also, she states that she cannot think of the word, but until she gives herself some time, and then "it comes back to me ".  She also reports increasing panic attacks.  She has never seen a psychiatrist, but she admits that about 3 years ago, she has  similar symptoms, where she drives and she is afraid of going into the other side of the road.  Also, while driving on [facility], she feels that she is going to get lost or hit someone, and begins that panic "mode ".  She was "fine for 2 years, and then it hit me again this year ".  Of note, her primary care physician gave her Celexa to try, but refused counseling referral.  She sleeps well and denies hallucinations, sleepwalking.  She denies any vivid dreams.  "My only paranoia is about getting lost and how to keep my car in the lane".  She denies leaving objects in unusual places, but she does leave these objects anywhere in the house and then she cannot remember where she left them.  She denies any issues with bathing and dressing, or medications.  Her husband took over the finances, after she was found over paying "I have an issue with the zeros, I cannot read 100 versus 1000" and has been overpaying.  Her appetite is good, and denies trouble swallowing.  She cooks and denies leaving the stove or the faucet on.  She ambulates without difficulty without the use of a walker or a cane, denies falls or head injuries. Denies headaches, double vision, dizziness, focal numbness or tingling, unilateral weakness or tremors. Denies urine incontinence or retention. Denies constipation or diarrhea.  She denies anosmia, history of OSA, alcohol or tobacco use.  She stopped smoking many years ago, 46-year pack history.  Family history remarkable for brother and mother with vascular dementia.  She has been under significant amount of stress, after taking care of many family members "a lot on my shoulders, including my mother dying with melanoma ".  My brother is also very ill and I am taking care of him, everybody is taking advantage of me".   Labs 2/ 9, 2022 show vitamin B12 679 TSH 1.17    MRI of the brain on 03/11/2021 shows scattered foci of susceptibility throughout the bilateral cerebral hemispheres and right  cerebellar hemisphere, nonspecific but can be seen in setting of cerebral amyloid angiopathy. In addition osseous metastatic disease in the setting of a history of lung cancer but could also represent a focus of fibrous dysplasia. Mildly advanced cerebral volume loss for age, with sequela of severe chronic small vessel ischemic disease in the periventricular white matter and pons was noted.    PREVIOUS MEDICATIONS: rivastigmine, donepezil (GI)     CURRENT MEDICATIONS:  Outpatient Encounter Medications as of 10/18/2022  Medication Sig   alendronate (FOSAMAX) 70 MG tablet TAKE 1 TABLET BY MOUTH ONCE A WEEK. TAKE WITH A FULL GLASS OF WATER ON AN EMPTY STOMACH.   Apoaequorin (PREVAGEN) 10 MG  CAPS Take 20 mg by mouth daily.   aspirin 81 MG tablet Take 81 mg by mouth daily.   atorvastatin (LIPITOR) 10 MG tablet TAKE 1 TABLET BY MOUTH EVERY DAY   Cholecalciferol (VITAMIN D) 2000 UNITS CAPS Take 1 capsule by mouth daily.   citalopram (CELEXA) 10 MG tablet Take 1 tablet (10 mg total) by mouth daily.   famotidine (PEPCID) 20 MG tablet Take 1 tablet (20 mg total) by mouth daily.   fluconazole (DIFLUCAN) 150 MG tablet Take 1 tablet (150 mg total) by mouth once a week.   fluticasone (FLONASE) 50 MCG/ACT nasal spray SPRAY 2 SPRAYS INTO EACH NOSTRIL EVERY DAY   Fluticasone-Salmeterol 113-14 MCG/ACT AEPB TAKE 2 PUFFS BY MOUTH TWICE A DAY   Fluticasone-Umeclidin-Vilant (TRELEGY ELLIPTA) 100-62.5-25 MCG/ACT AEPB Inhale 1 puff into the lungs daily.   ketoconazole (NIZORAL) 2 % cream Apply 1 Application topically daily.   LORazepam (ATIVAN) 1 MG tablet Take 1 tablet (1 mg total) by mouth at bedtime as needed.   montelukast (SINGULAIR) 10 MG tablet TAKE 1 TABLET BY MOUTH EVERY DAY   QUEtiapine (SEROQUEL) 50 MG tablet Take 1 tablet (50 mg total) by mouth 2 (two) times daily.   vitamin B-12 (CYANOCOBALAMIN) 1000 MCG tablet Take 1 tablet (1,000 mcg total) by mouth daily.   [DISCONTINUED] amLODipine (NORVASC) 5 MG  tablet Take 1 tablet (5 mg total) by mouth daily.   [DISCONTINUED] memantine (NAMENDA) 10 MG tablet Take 1 tablet  10 mg 2 times a day   memantine (NAMENDA) 10 MG tablet Take 1 tablet  10 mg 2 times a day   [DISCONTINUED] memantine (NAMENDA) 5 MG tablet TAKE 1 TABLET (5 MG AT NIGHT) FOR 2 WEEKS, THEN INCREASE TO 1 TABLET (5 MG) TWICE A DAY (Patient not taking: Reported on 10/18/2022)   No facility-administered encounter medications on file as of 10/18/2022.       10/18/2022   10:00 AM 04/03/2022    9:00 AM 01/07/2022    3:05 PM  MMSE - Mini Mental State Exam  Not completed:   Unable to complete  Orientation to time 5 3   Orientation to Place 1 4   Registration 3 3   Attention/ Calculation 0 3   Recall 3 3   Language- name 2 objects 1 2   Language- repeat 1 0   Language- follow 3 step command 3 2   Language- read & follow direction 1 1   Write a sentence 1 1   Copy design 0 0   Total score 19 22       04/01/2021   11:00 AM  Montreal Cognitive Assessment   Visuospatial/ Executive (0/5) 0  Naming (0/3) 2  Attention: Read list of digits (0/2) 2  Attention: Read list of letters (0/1) 1  Attention: Serial 7 subtraction starting at 100 (0/3) 1  Language: Repeat phrase (0/2) 0  Language : Fluency (0/1) 1  Abstraction (0/2) 1  Delayed Recall (0/5) 5  Orientation (0/6) 5  Total 18  Adjusted Score (based on education) 18    Objective:     PHYSICAL EXAMINATION:    VITALS:   Vitals:   10/18/22 0927  BP: (!) 145/85  Pulse: 60  SpO2: 92%  Weight: 143 lb (64.9 kg)  Height: 4\' 11"  (1.499 m)    GEN:  The patient appears stated age and is in NAD. HEENT:  Normocephalic, atraumatic.   Neurological examination:  General: NAD, well-groomed, appears stated age. Orientation: The patient  is alert. Oriented to person, not to  place, oriented to date Cranial nerves: There is good facial symmetry.The speech is not very  fluent but and clear. No aphasia or dysarthria. Fund of  knowledge is appropriate. Recent and remote memory are impaired. Attention and concentration are normal  Able to name objects 1/2 and able to repeat phrases.  Hearing is intact to conversational tone.   Sensation: Sensation is intact to light touch throughout Motor: Strength is at least antigravity x4. DTR's 2/4 in UE/LE     Movement examination: Tone: There is normal tone in the UE/LE Abnormal movements:  no tremor.  No myoclonus.  No asterixis.   Coordination:  There is no decremation with RAM's. On the R, mild decremation on the left  Normal finger to nose on the R, not on the L    Gait and Station: The patient has no difficulty arising out of a deep-seated chair without the use of the hands. The patient's stride length is good.  Gait is cautious and narrow.    Thank you for allowing [place] the opportunity to participate in the care of this nice patient. Please do not hesitate to contact us for any questions or concerns.   Total time spent on today's visit was 21 minutes dedicated to this patient today, preparing to see patient, examining the patient, ordering tests and/or medications and counseling the patient, documenting clinical information in the EHR or other health record, independently interpreting results and communicating results to the patient/family, discussing treatment and goals, answering patient's questions and coordinating care.  Cc:  Corwin Levins, MD  Marlowe Kays 10/18/2022 10:35 AM

## 2022-10-18 NOTE — Patient Instructions (Signed)
It was a pleasure to see you today at our office.   Recommendations:  Follow up in 6  months Increase memantine to 10 mg bid    Whom to call:  Memory  decline, memory medications: Call our office [phone]   For psychiatric meds, mood meds: Please have your primary care physician manage these medications.     For assessment of decision of mental capacity and competency:  Call Dr. Erick Blinks, geriatric psychiatrist at [phone]  For guidance in geriatric dementia issues please call [facility] [phone]  For guidance regarding [facility] and if placement were needed at the facility, contact Sidney Ace, Social Worker tel: [phone]  If you have any severe symptoms of a stroke, or other severe issues such as confusion,severe chills or fever, etc call 911 or go to the ER as you may need to be evaluated further          RECOMMENDATIONS FOR ALL PATIENTS WITH MEMORY PROBLEMS: 1. Continue to exercise (Recommend 30 minutes of walking everyday, or 3 hours every week) 2. Increase social interactions - continue going to [place] and enjoy social gatherings with friends and family 3. Eat healthy, avoid fried foods and eat more fruits and vegetables 4. Maintain adequate blood pressure, blood sugar, and blood cholesterol level. Reducing the risk of stroke and cardiovascular disease also helps promoting better memory. 5. Avoid stressful situations. Live a simple life and avoid aggravations. Organize your time and prepare for the next day in anticipation. 6. Sleep well, avoid any interruptions of sleep and avoid any distractions in the bedroom that may interfere with adequate sleep quality 7. Avoid sugar, avoid sweets as there is a strong link between excessive sugar intake, diabetes, and cognitive impairment We discussed the Mediterranean diet, which has been shown to help patients reduce the risk of progressive memory disorders and reduces  cardiovascular risk. This includes eating fish, eat fruits and green leafy vegetables, nuts like almonds and hazelnuts, walnuts, and also use olive oil. Avoid fast foods and fried foods as much as possible. Avoid sweets and sugar as sugar use has been linked to worsening of memory function.  There is always a concern of gradual progression of memory problems. If this is the case, then we may need to adjust level of care according to patient needs. Support, both to the patient and caregiver, should then be put into place.    FALL PRECAUTIONS: Be cautious when walking. Scan the area for obstacles that may increase the risk of trips and falls. When getting up in the mornings, sit up at the edge of the bed for a few minutes before getting out of bed. Consider elevating the bed at the head end to avoid drop of blood pressure when getting up. Walk always in a well-lit room (use night lights in the walls). Avoid area rugs or power cords from appliances in the middle of the walkways. Use a walker or a cane if necessary and consider physical therapy for balance exercise. Get your eyesight checked regularly.  FINANCIAL OVERSIGHT: Supervision, especially oversight when making financial decisions or transactions is also recommended.  HOME SAFETY: Consider the safety of the kitchen when operating appliances like stoves, microwave oven, and blender. Consider having supervision and share cooking responsibilities until no longer able to participate in those. Accidents with firearms and other hazards in the house should be identified and addressed as well.   ABILITY TO BE LEFT ALONE: If patient is unable to contact  911 operator, consider using [employer], or when the need is there, arrange for someone to stay with patients. Smoking is a fire hazard, consider supervision or cessation. Risk of wandering should be assessed by caregiver and if detected at any point, supervision and safe proof recommendations should be  instituted.  MEDICATION SUPERVISION: Inability to self-administer medication needs to be constantly addressed. Implement a mechanism to ensure safe administration of the medications.   DRIVING: Regarding driving, in patients with progressive memory problems, driving will be impaired. We advise to have someone else do the driving if trouble finding directions or if minor accidents are reported. Independent driving assessment is available to determine safety of driving.   If you are interested in the driving assessment, you can contact the following:  [employer] [facility] in [place] [phone]  [facility] [phone]  [facility] [phone]  [facility] [phone] or [phone]

## 2022-11-22 ENCOUNTER — Other Ambulatory Visit: Payer: Self-pay | Admitting: Internal Medicine

## 2022-11-25 ENCOUNTER — Other Ambulatory Visit: Payer: Self-pay

## 2022-12-04 IMAGING — CT CT CHEST SUPER D W/O CM
1 of 2 series · 14 of 32 positions shown, 18 images · non-contrast
Comparison: 09/20/2021

CLINICAL DATA: Follow-up left upper lobe and left lower lobe
lesions. History of lung cancer.

EXAM:
CT CHEST WITHOUT CONTRAST
TECHNIQUE: Multidetector CT imaging of the chest was performed using thin slice
collimation for electromagnetic bronchoscopy planning purposes,
without intravenous contrast.
RADIATION DOSE REDUCTION: This exam was performed according to the
departmental dose-optimization program which includes automated
exposure control, adjustment of the mA and/or kV according to
patient size and/or use of iterative reconstruction technique.

[Series 6: super d · axial · 0.78mm/px · z∈[-353,-88]mm · 14 of 371 slices shown, 18 images]
[im 20/371  mediastinal]
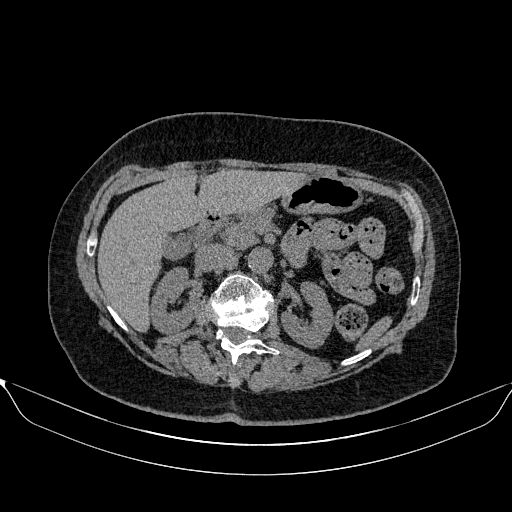
[im 20/371  lung]
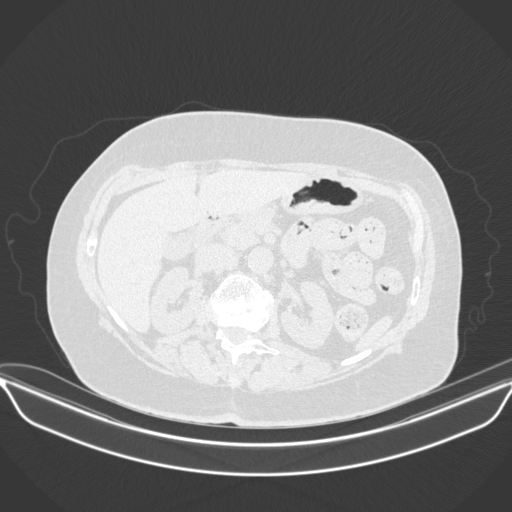
[im 59/371  lung]
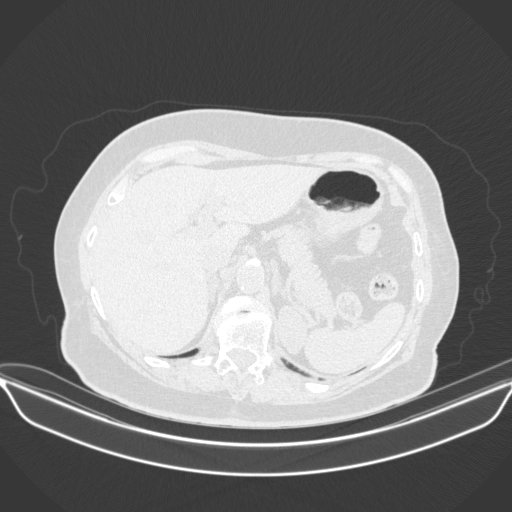
[im 78/371  lung]
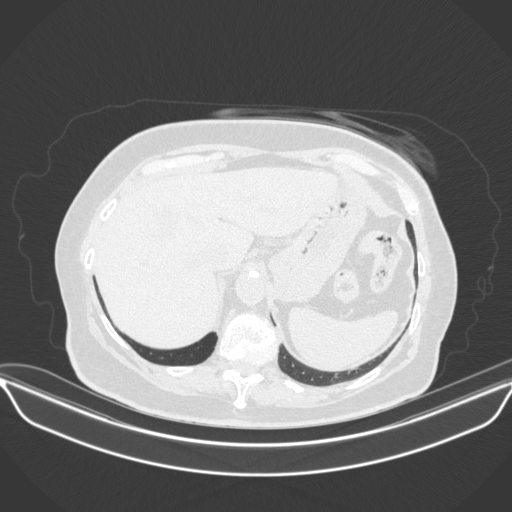
[im 117/371  lung]
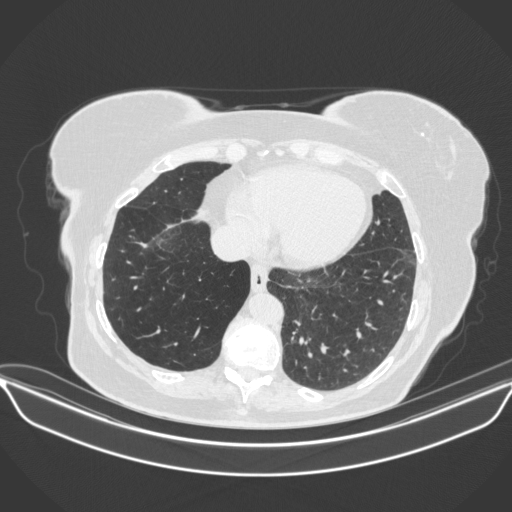
[im 124/371  mediastinal]
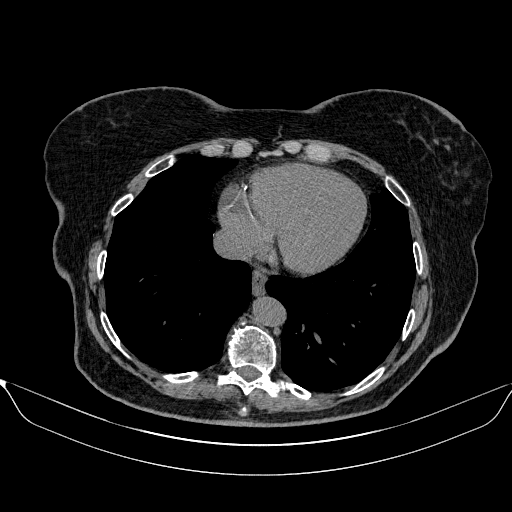
[im 124/371  lung]
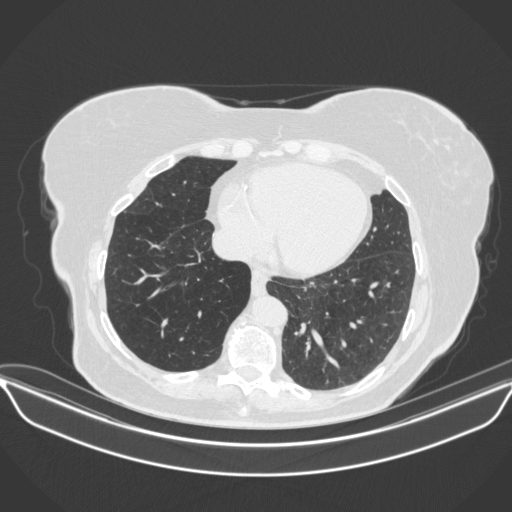
[im 156/371  lung]
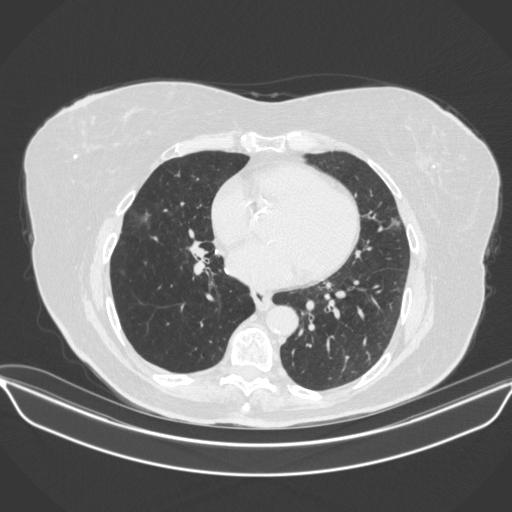
[im 174/371  lung]
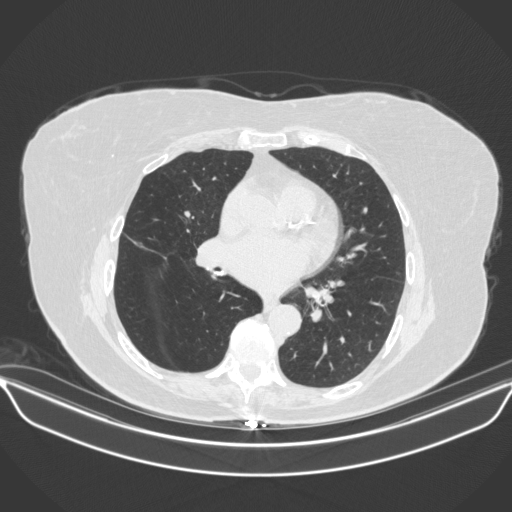
[im 195/371  lung]
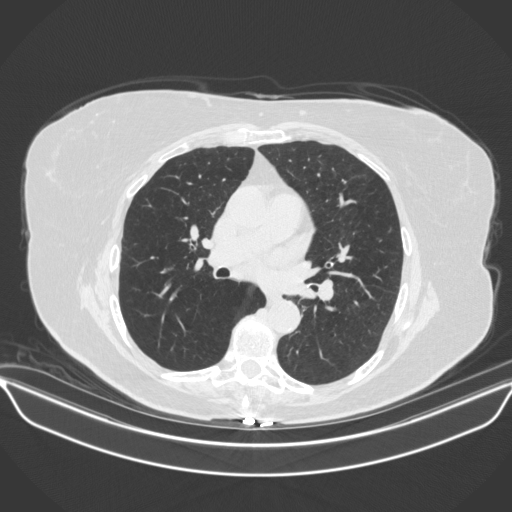
[im 215/371  mediastinal]
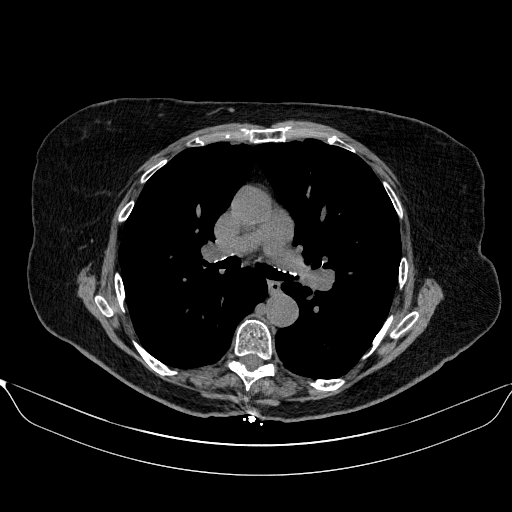
[im 215/371  lung]
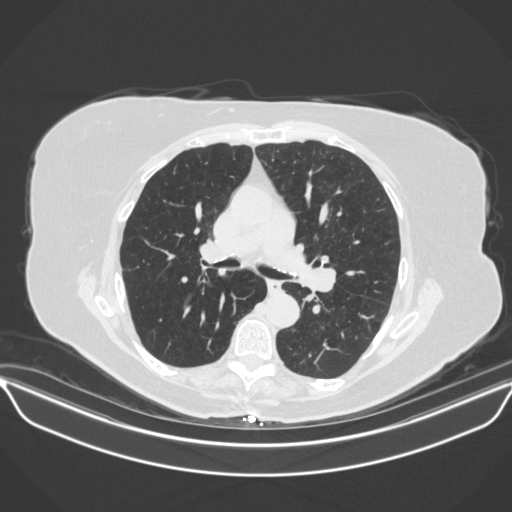
[im 247/371  lung]
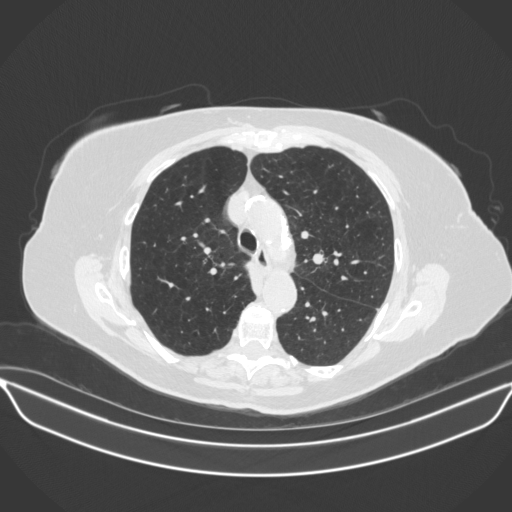
[im 254/371  lung]
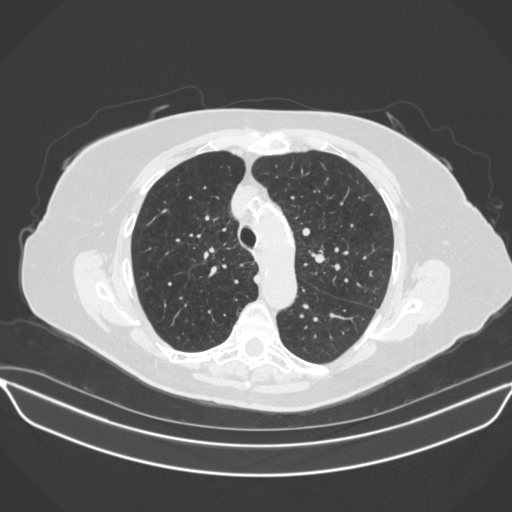
[im 293/371  lung]
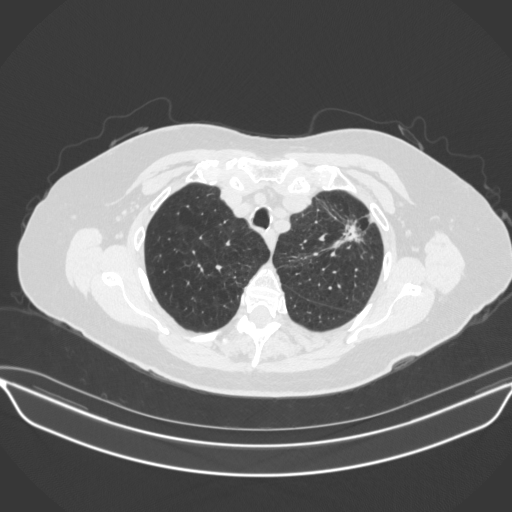
[im 312/371  mediastinal]
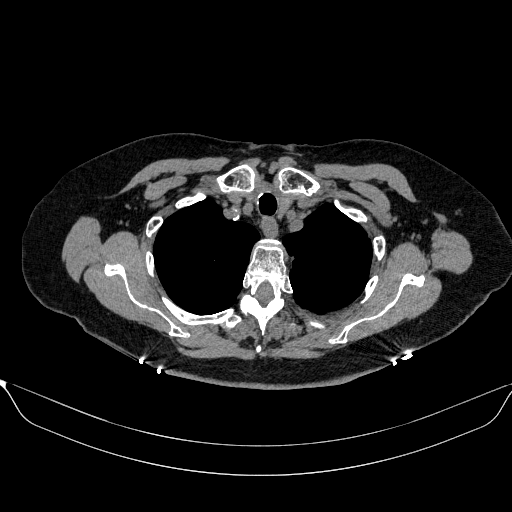
[im 312/371  lung]
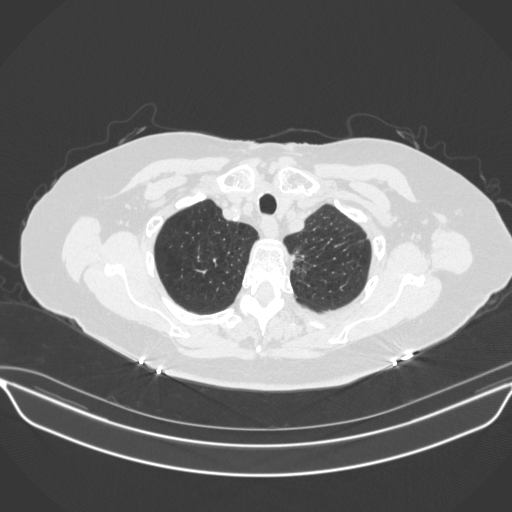
[im 351/371  lung]
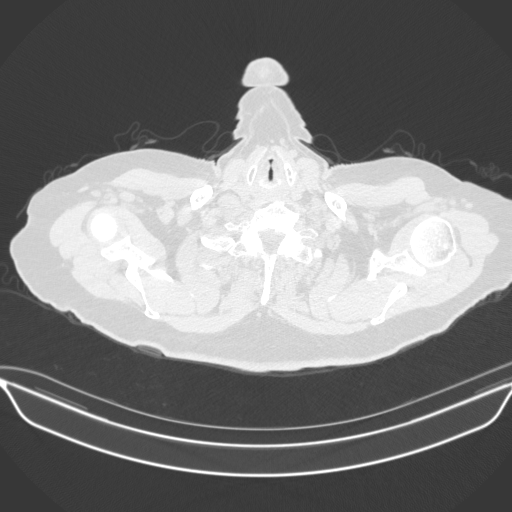

[14 of 32 positions shown; findings below may reference images not displayed]

FINDINGS: Cardiovascular: Heart size normal. Aortic atherosclerosis and
coronary artery calcifications. No pericardial effusion identified.

Mediastinum/Nodes: No enlarged mediastinal or axillary lymph nodes.
Thyroid gland, trachea, and esophagus demonstrate no significant
findings.

Lungs/Pleura: Status post right lower lobectomy. No pleural
effusion, airspace consolidation, atelectasis or pneumothorax.
Bilateral nodular densities are again identified.

-within the central aspect of the right middle lobe there is a
concern for a central nodule (difficult to differentiate from
surrounding vascular structures due to lack of contrast) measuring
approximately 1 cm, image 91/5 and image 66/4.

-Persistent part solid nodular density within the subpleural
paramediastinal left upper lobe measuring 1.2 cm, image [DATE]. The
solid component measures 5 mm and is unchanged compared with the
previous exam

-Unchanged appearance of irregular nodular lesion with surrounding
architectural distortion within the anterolateral left upper lobe.
This extends to the pleural surface measuring in total 2.4 x 1.5 cm,
image [DATE]. Not significantly changed from previous exam.

-Small non solid nodule within the subpleural aspect of the
posterolateral left upper lobe abuts the oblique fissure measuring 6
mm, image 41/5. Unchanged from previous exam.

-Scar like density is noted within the lingula, image 94/5.

-Smoothly marginated, solid nodule within the lateral left lung base
appears unchanged measuring 7 mm.

Upper Abdomen: No acute abnormality. Low-attenuation structure
within right lobe of liver measures 2.3 cm and appears unchanged
from the previous exam. This corresponds to previously characterized
benign liver hemangioma. Aortic atherosclerotic calcifications
identified. Small hiatal hernia.

Musculoskeletal: No chest wall mass or suspicious bone lesions
identified.
IMPRESSION: 1. Again noted are multiple nodular densities within both lungs.
These are not significantly changed when compared with the recent
study from 09/20/2021. Unfortunately, there are no more remote
studies available for comparison in this patient who has a history
of lung cancer and is status post right lower lobectomy.
2. Not mentioned on the previous exam is a suspected central right
middle lobe perihilar nodule measuring 1 cm. PET-CT may be helpful
to differentiate between vascular structures and suspected nodule.
3. Consider further evaluation with PET-CT prior to biopsy to
identify areas of hypermetabolism which may differentiate areas of
postinflammatory change versus underlying recurrent tumor or
metastatic disease.

Aortic Atherosclerosis (VNAMG-3FW.W) and Emphysema (VNAMG-PD2.3).

## 2022-12-09 DIAGNOSIS — H353231 Exudative age-related macular degeneration, bilateral, with active choroidal neovascularization: Secondary | ICD-10-CM | POA: Diagnosis not present

## 2022-12-19 ENCOUNTER — Encounter: Payer: Self-pay | Admitting: Internal Medicine

## 2022-12-19 ENCOUNTER — Ambulatory Visit (INDEPENDENT_AMBULATORY_CARE_PROVIDER_SITE_OTHER): Payer: Medicare Other | Admitting: Internal Medicine

## 2022-12-19 VITALS — BP 130/78 | HR 65 | Temp 98.5°F | Ht 59.0 in | Wt 146.0 lb

## 2022-12-19 DIAGNOSIS — R739 Hyperglycemia, unspecified: Secondary | ICD-10-CM | POA: Diagnosis not present

## 2022-12-19 DIAGNOSIS — H9193 Unspecified hearing loss, bilateral: Secondary | ICD-10-CM | POA: Diagnosis not present

## 2022-12-19 DIAGNOSIS — I10 Essential (primary) hypertension: Secondary | ICD-10-CM | POA: Diagnosis not present

## 2022-12-19 DIAGNOSIS — J4489 Other specified chronic obstructive pulmonary disease: Secondary | ICD-10-CM

## 2022-12-19 DIAGNOSIS — C3490 Malignant neoplasm of unspecified part of unspecified bronchus or lung: Secondary | ICD-10-CM | POA: Diagnosis not present

## 2022-12-19 DIAGNOSIS — K219 Gastro-esophageal reflux disease without esophagitis: Secondary | ICD-10-CM

## 2022-12-19 DIAGNOSIS — E538 Deficiency of other specified B group vitamins: Secondary | ICD-10-CM | POA: Diagnosis not present

## 2022-12-19 DIAGNOSIS — Z0001 Encounter for general adult medical examination with abnormal findings: Secondary | ICD-10-CM

## 2022-12-19 DIAGNOSIS — E559 Vitamin D deficiency, unspecified: Secondary | ICD-10-CM

## 2022-12-19 DIAGNOSIS — Z0000 Encounter for general adult medical examination without abnormal findings: Secondary | ICD-10-CM

## 2022-12-19 MED ORDER — PREDNISONE 10 MG PO TABS: ORAL_TABLET | ORAL | 0 refills | Status: DC

## 2022-12-19 MED ORDER — ALBUTEROL SULFATE HFA 108 (90 BASE) MCG/ACT IN AERS: 2.0000 | INHALATION_SPRAY | Freq: Four times a day (QID) | RESPIRATORY_TRACT | 0 refills | Status: DC | PRN

## 2022-12-19 MED ORDER — TRELEGY ELLIPTA 100-62.5-25 MCG/ACT IN AEPB: 1.0000 | INHALATION_SPRAY | Freq: Every day | RESPIRATORY_TRACT | 3 refills | Status: DC

## 2022-12-19 MED ORDER — PANTOPRAZOLE SODIUM 40 MG PO TBEC: 40.0000 mg | DELAYED_RELEASE_TABLET | Freq: Every day | ORAL | 3 refills | Status: DC

## 2022-12-19 NOTE — Progress Notes (Signed)
Patient ID: Pamela Terry, female   DOB: [birth year], 77 y.o.   MRN: [id number]         Chief Complaint:: wellness exam and digestion issues  (Has a burning sensation while eating starts in chest goes down in abdomen , has tried taking otc for heartburn and isn't working and the pepcid isnt helping , has been having a cough for awhile , also been breathing heavy )  , bialteral hearing loss, low b12, hyperglycemia, htn       HPI:  Pamela Terry is a 77 y.o. female here for wellness exam; for tdap and shingrix at pharmacy, declines covid booster, o/w up to date                        Also with mild sob doe going from room to room at home with scant cough, worse in the am on first getting up,  o/w Pt denies chest pain, orthopnea, PND, increased LE swelling, palpitations, dizziness or syncope.   Pt denies polydipsia, polyuria, or new focal neuro s/s.    Pt denies fever, wt loss, night sweats, loss of appetite, or other constitutional symptoms  Has not has fu with pulm recently.  Does have worsening bilateral hearing loss - asks for audiology referral.  Denies worsening abd pain, dysphagia, n/v, bowel change or blood, but has had mild worsening reflux.     Wt Readings from Last 3 Encounters:  12/19/22 146 lb (66.2 kg)  10/18/22 143 lb (64.9 kg)  09/03/22 138 lb 12.8 oz (63 kg)   BP Readings from Last 3 Encounters:  12/19/22 130/78  10/18/22 (!) 145/85  09/03/22 (!) 144/82   Immunization History  Administered Date(s) Administered   Fluad Quad(high Dose 65+) 02/26/2019, 04/02/2022   H1N1 06/21/2008   Influenza Split 04/05/2011, 04/14/2012, 04/05/2013, 03/28/2014   Influenza Whole 04/02/2010   Influenza, High Dose Seasonal PF 03/23/2018   Influenza,inj,Quad PF,6+ Mos 04/10/2015, 04/15/2016   Influenza-Unspecified 03/24/2017, 03/23/2018, 04/24/2020, 04/02/2021   PFIZER Comirnaty(Gray Top)Covid-19 Tri-Sucrose Vaccine 01/06/2021   PFIZER(Purple Top)SARS-COV-2 Vaccination 07/30/2019, 08/24/2019,  04/24/2020, 02/05/2021   Pneumococcal Conjugate-13 05/14/2016   Pneumococcal Polysaccharide-23 05/18/2012   Tdap 05/20/2011   Zoster Recombinant(Shingrix) 04/05/2022   Zoster, Live 07/08/2011   Health Maintenance Due  Topic Date Due   DTaP/Tdap/Td (2 - Td or Tdap) 05/19/2021   COVID-19 Vaccine (5 - 2023-24 season) 02/22/2022   Zoster Vaccines- Shingrix (2 of 2) 05/31/2022      Past Medical History:  Diagnosis Date   Acute asthmatic bronchitis    Adenocarcinoma, lung (HCC)    Allergic rhinitis    Anxiety    Carcinoma in situ of endocrine gland    Colon polyp    COPD (chronic obstructive pulmonary disease) (HCC)    Diverticulosis of colon    History of cervical cancer    Hypercholesterolemia    Microscopic hematuria    Osteoporosis    Past Surgical History:  Procedure Laterality Date   ABDOMINAL HYSTERECTOMY     in her 30's   APPENDECTOMY     TOTAL ABDOMINAL HYSTERECTOMY W/ BILATERAL SALPINGOOPHORECTOMY  1985   Dr Elana Alm for Ca of cervix    reports that she quit smoking about 37 years ago. Her smoking use included cigarettes. She has a 46.00 pack-year smoking history. She has never used smokeless tobacco. She reports that she does not drink alcohol and does not use drugs. family history includes Asthma in her maternal aunt; Breast  cancer in her paternal grandmother; Diabetes in her brother; Heart disease in her father, maternal grandmother, and paternal grandfather; Hyperlipidemia in her father; Prostate cancer in her maternal grandfather; Rheum arthritis in her mother; Stroke in her father. Allergies  Allergen Reactions   Augmentin [Amoxicillin-Pot Clavulanate]     Severe diarrhea   Sulfonamide Derivatives Hives and Rash   Current Outpatient Medications on File Prior to Visit  Medication Sig Dispense Refill   alendronate (FOSAMAX) 70 MG tablet TAKE 1 TABLET BY MOUTH ONCE A WEEK. TAKE WITH A FULL GLASS OF WATER ON AN EMPTY STOMACH. 12 tablet 3   Apoaequorin (PREVAGEN)  10 MG CAPS Take 20 mg by mouth daily.     aspirin 81 MG tablet Take 81 mg by mouth daily.     atorvastatin (LIPITOR) 10 MG tablet TAKE 1 TABLET BY MOUTH EVERY DAY 90 tablet 3   Cholecalciferol (VITAMIN D) 2000 UNITS CAPS Take 1 capsule by mouth daily.     citalopram (CELEXA) 10 MG tablet TAKE 1 TABLET BY MOUTH EVERY DAY 90 tablet 3   famotidine (PEPCID) 20 MG tablet Take 1 tablet (20 mg total) by mouth daily. 90 tablet 1   fluconazole (DIFLUCAN) 150 MG tablet Take 1 tablet (150 mg total) by mouth once a week. 2 tablet 0   fluticasone (FLONASE) 50 MCG/ACT nasal spray SPRAY 2 SPRAYS INTO EACH NOSTRIL EVERY DAY 48 mL 3   Fluticasone-Salmeterol 113-14 MCG/ACT AEPB TAKE 2 PUFFS BY MOUTH TWICE A DAY     ketoconazole (NIZORAL) 2 % cream Apply 1 Application topically daily. 60 g 0   LORazepam (ATIVAN) 1 MG tablet Take 1 tablet (1 mg total) by mouth at bedtime as needed. 90 tablet 1   memantine (NAMENDA) 10 MG tablet Take 1 tablet  10 mg 2 times a day 180 tablet 4   montelukast (SINGULAIR) 10 MG tablet TAKE 1 TABLET BY MOUTH EVERY DAY 90 tablet 3   QUEtiapine (SEROQUEL) 25 MG tablet TAKE 1 TABLET BY MOUTH EVERYDAY AT BEDTIME 90 tablet 7   QUEtiapine (SEROQUEL) 50 MG tablet Take 1 tablet (50 mg total) by mouth 2 (two) times daily. 180 tablet 3   vitamin B-12 (CYANOCOBALAMIN) 1000 MCG tablet Take 1 tablet (1,000 mcg total) by mouth daily. 90 tablet 1   No current facility-administered medications on file prior to visit.        ROS:  All others reviewed and negative.  Objective        PE:  BP 130/78 (BP Location: Right Arm, Patient Position: Sitting, Cuff Size: Normal)   Pulse 65   Temp 98.5 F (36.9 C) (Oral)   Ht 4\' 11"  (1.499 m)   Wt 146 lb (66.2 kg)   SpO2 100%   BMI 29.49 kg/m                 Constitutional: Pt appears in NAD               HENT: Head: NCAT.                Right Ear: External ear normal.                 Left Ear: External ear normal.                Eyes: . Pupils are  equal, round, and reactive to light. Conjunctivae and EOM are normal  Nose: without d/c or deformity               Neck: Neck supple. Gross normal ROM               Cardiovascular: Normal rate and regular rhythm.                 Pulmonary/Chest: Effort normal and breath sounds without rales or wheezing.                Abd:  Soft, NT, ND, + BS, no organomegaly               Neurological: Pt is alert. At baseline orientation, motor grossly intact               Skin: Skin is warm. No rashes, no other new lesions, LE edema - none               Psychiatric: Pt behavior is normal without agitation   Micro: none  Cardiac tracings I have personally interpreted today:  none  Pertinent Radiological findings (summarize): none   Lab Results  Component Value Date   WBC 9.9 12/20/2022   HGB 14.1 12/20/2022   HCT 43.2 12/20/2022   PLT 271.0 12/20/2022   GLUCOSE 93 12/20/2022   CHOL 152 12/20/2022   TRIG 101.0 12/20/2022   HDL 49.40 12/20/2022   LDLCALC 83 12/20/2022   ALT 21 12/20/2022   AST 25 12/20/2022   NA 141 12/20/2022   K 3.8 12/20/2022   CL 105 12/20/2022   CREATININE 0.95 12/20/2022   BUN 15 12/20/2022   CO2 28 12/20/2022   TSH 1.72 12/20/2022   HGBA1C 5.9 12/20/2022   Assessment/Plan:  THIENAN WESTOVER is a 77 y.o. White or Caucasian [1] female with  has a past medical history of Acute asthmatic bronchitis, Adenocarcinoma, lung (HCC), Allergic rhinitis, Anxiety, Carcinoma in situ of endocrine gland, Colon polyp, COPD (chronic obstructive pulmonary disease) (HCC), Diverticulosis of colon, History of cervical cancer, Hypercholesterolemia, Microscopic hematuria, and Osteoporosis.  Malignant neoplasm of bronchus and lung (HCC) For pulm referral to f/u  Encounter for well adult exam with abnormal findings Age and sex appropriate education and counseling updated with regular exercise and diet Referrals for preventative services - none needed Immunizations addressed -  for tdap and shingrix at pharmacy, declins covid booster Smoking counseling  - none needed Evidence for depression or other mood disorder - none significant Most recent labs reviewed. I have personally reviewed and have noted: 1) the patient's medical and social history 2) The patient's current medications and supplements 3) The patient's height, weight, and BMI have been recorded in the chart   COPD (chronic obstructive pulmonary disease) with chronic bronchitis (HCC) Mild uncontrolled, for start albuterol hfa prn, and prednisone taper x `1  B12 deficiency Lab Results  Component Value Date   VITAMINB12 >1500 (H) 12/20/2022   Stable, cont oral replacement - b12 1000 mcg qd   Hyperglycemia Lab Results  Component Value Date   HGBA1C 5.9 12/20/2022   Stable, pt to continue current medical treatment  - diet,wt control   Primary hypertension BP Readings from Last 3 Encounters:  12/19/22 130/78  10/18/22 (!) 145/85  09/03/22 (!) 144/82   Stable, pt to continue medical treatment  - diet , wt control   Bilateral hearing loss Also for audiology referral  Gastroesophageal reflux disease without esophagitis With recent mild worsening, for protonix 40 qd Followup: Return in about 6 months (around  06/20/2023).  Oliver Barre, MD 12/21/2022 7:52 PM [facility]

## 2022-12-19 NOTE — Patient Instructions (Signed)
Please take all new medication as prescribed - the prednisone, and protonix  Please continue all other medications as before, and refills have been done if requested - the albuterol and trelegy  Please have the pharmacy call with any other refills you may need.  Please continue your efforts at being more active, low cholesterol diet, and weight control.  You are otherwise up to date with prevention measures today.  Please keep your appointments with your specialists as you may have planned - neurology  You will be contacted regarding the referral for: pulmonary, and audiology  Please go to the XRAY Department in the first floor for the x-ray testing  - tomorrow  Please go to the LAB at the blood drawing area for the tests to be done - tomorrow  You will be contacted by phone if any changes need to be made immediately.  Otherwise, you will receive a letter about your results with an explanation, but please check with MyChart first.  Please remember to sign up for MyChart if you have not done so, as this will be important to you in the future with finding out test results, communicating by private email, and scheduling acute appointments online when needed.  Please make an Appointment to return in 6 months, or sooner if needed, also with Lab Appointment for testing done 3-5 days before at the FIRST FLOOR Lab (so this is for TWO appointments - please see the scheduling desk as you leave)

## 2022-12-20 ENCOUNTER — Other Ambulatory Visit: Payer: Self-pay | Admitting: Internal Medicine

## 2022-12-20 ENCOUNTER — Ambulatory Visit (INDEPENDENT_AMBULATORY_CARE_PROVIDER_SITE_OTHER): Payer: Medicare Other

## 2022-12-20 DIAGNOSIS — R0602 Shortness of breath: Secondary | ICD-10-CM | POA: Diagnosis not present

## 2022-12-20 DIAGNOSIS — C3490 Malignant neoplasm of unspecified part of unspecified bronchus or lung: Secondary | ICD-10-CM

## 2022-12-20 DIAGNOSIS — R911 Solitary pulmonary nodule: Secondary | ICD-10-CM | POA: Diagnosis not present

## 2022-12-20 DIAGNOSIS — J4489 Other specified chronic obstructive pulmonary disease: Secondary | ICD-10-CM

## 2022-12-20 DIAGNOSIS — R062 Wheezing: Secondary | ICD-10-CM | POA: Diagnosis not present

## 2022-12-20 DIAGNOSIS — R918 Other nonspecific abnormal finding of lung field: Secondary | ICD-10-CM | POA: Diagnosis not present

## 2022-12-20 LAB — CBC WITH DIFFERENTIAL/PLATELET
Basophils Absolute: 0.1 10*3/uL (ref 0.0–0.1)
Basophils Relative: 0.9 % (ref 0.0–3.0)
Eosinophils Absolute: 0.5 10*3/uL (ref 0.0–0.7)
Eosinophils Relative: 5.3 % — ABNORMAL HIGH (ref 0.0–5.0)
HCT: 43.2 % (ref 36.0–46.0)
Hemoglobin: 14.1 g/dL (ref 12.0–15.0)
Lymphocytes Relative: 24 % (ref 12.0–46.0)
Lymphs Abs: 2.4 10*3/uL (ref 0.7–4.0)
MCHC: 32.6 g/dL (ref 30.0–36.0)
MCV: 92.7 fl (ref 78.0–100.0)
Monocytes Absolute: 0.8 10*3/uL (ref 0.1–1.0)
Monocytes Relative: 8 % (ref 3.0–12.0)
Neutro Abs: 6.1 10*3/uL (ref 1.4–7.7)
Neutrophils Relative %: 61.8 % (ref 43.0–77.0)
Platelets: 271 10*3/uL (ref 150.0–400.0)
RBC: 4.66 Mil/uL (ref 3.87–5.11)
RDW: 13.9 % (ref 11.5–15.5)
WBC: 9.9 10*3/uL (ref 4.0–10.5)

## 2022-12-20 LAB — HEPATIC FUNCTION PANEL
ALT: 21 U/L (ref 0–35)
AST: 25 U/L (ref 0–37)
Albumin: 4.1 g/dL (ref 3.5–5.2)
Alkaline Phosphatase: 89 U/L (ref 39–117)
Bilirubin, Direct: 0.1 mg/dL (ref 0.0–0.3)
Total Bilirubin: 0.7 mg/dL (ref 0.2–1.2)
Total Protein: 7.2 g/dL (ref 6.0–8.3)

## 2022-12-20 LAB — LIPID PANEL
Cholesterol: 152 mg/dL (ref 0–200)
HDL: 49.4 mg/dL (ref >39.00)
LDL Cholesterol: 83 mg/dL (ref 0–99)
NonHDL: 102.87
Total CHOL/HDL Ratio: 3
Triglycerides: 101 mg/dL (ref 0.0–149.0)
VLDL: 20.2 mg/dL (ref 0.0–40.0)

## 2022-12-20 LAB — URINALYSIS, ROUTINE W REFLEX MICROSCOPIC
Bilirubin Urine: NEGATIVE
Ketones, ur: NEGATIVE
Leukocytes,Ua: NEGATIVE
Nitrite: NEGATIVE
Specific Gravity, Urine: 1.01 (ref 1.000–1.030)
Total Protein, Urine: NEGATIVE
Urine Glucose: NEGATIVE
Urobilinogen, UA: 0.2 (ref 0.0–1.0)
pH: 7 (ref 5.0–8.0)

## 2022-12-20 LAB — BASIC METABOLIC PANEL
BUN: 15 mg/dL (ref 6–23)
CO2: 28 mEq/L (ref 19–32)
Calcium: 9.6 mg/dL (ref 8.4–10.5)
Chloride: 105 mEq/L (ref 96–112)
Creatinine, Ser: 0.95 mg/dL (ref 0.40–1.20)
GFR: 58.46 mL/min — ABNORMAL LOW (ref >60.00)
Glucose, Bld: 93 mg/dL (ref 70–99)
Potassium: 3.8 mEq/L (ref 3.5–5.1)
Sodium: 141 mEq/L (ref 135–145)

## 2022-12-20 LAB — VITAMIN B12: Vitamin B-12: >1500 pg/mL — ABNORMAL HIGH (ref 211–911)

## 2022-12-20 LAB — VITAMIN D 25 HYDROXY (VIT D DEFICIENCY, FRACTURES): VITD: 56.64 ng/mL (ref 30.00–100.00)

## 2022-12-20 LAB — TSH: TSH: 1.72 u[IU]/mL (ref 0.35–5.50)

## 2022-12-20 LAB — HEMOGLOBIN A1C: Hgb A1c MFr Bld: 5.9 % (ref 4.6–6.5)

## 2022-12-20 NOTE — Progress Notes (Signed)
The test results show that your current treatment is OK, as the tests are stable.  Please continue the same plan.  There is no other need for change of treatment or further evaluation based on these results, at this time.  thanks 

## 2022-12-21 ENCOUNTER — Encounter: Payer: Self-pay | Admitting: Internal Medicine

## 2022-12-21 DIAGNOSIS — H9193 Unspecified hearing loss, bilateral: Secondary | ICD-10-CM | POA: Insufficient documentation

## 2022-12-21 NOTE — Assessment & Plan Note (Signed)
Also for audiology referral 

## 2022-12-21 NOTE — Assessment & Plan Note (Signed)
Lab Results  Component Value Date   VITAMINB12 >1500 (H) 12/20/2022   Stable, cont oral replacement - b12 1000 mcg qd

## 2022-12-21 NOTE — Assessment & Plan Note (Signed)
Lab Results  Component Value Date   HGBA1C 5.9 12/20/2022   Stable, pt to continue current medical treatment  - diet, wt control 

## 2022-12-21 NOTE — Assessment & Plan Note (Signed)
Age and sex appropriate education and counseling updated with regular exercise and diet Referrals for preventative services - none needed Immunizations addressed - for tdap and shingrix at pharmacy, declins covid booster Smoking counseling  - none needed Evidence for depression or other mood disorder - none significant Most recent labs reviewed. I have personally reviewed and have noted: 1) the patient's medical and social history 2) The patient's current medications and supplements 3) The patient's height, weight, and BMI have been recorded in the chart

## 2022-12-21 NOTE — Assessment & Plan Note (Signed)
With recent mild worsening, for protonix 40 qd

## 2022-12-21 NOTE — Assessment & Plan Note (Signed)
BP Readings from Last 3 Encounters:  12/19/22 130/78  10/18/22 (!) 145/85  09/03/22 (!) 144/82   Stable, pt to continue medical treatment  - diet , wt control

## 2022-12-21 NOTE — Assessment & Plan Note (Signed)
For pulm referral to f/u °

## 2022-12-21 NOTE — Assessment & Plan Note (Signed)
Mild uncontrolled, for start albuterol hfa prn, and prednisone taper x `1

## 2023-01-01 ENCOUNTER — Ambulatory Visit: Payer: Medicare Other | Attending: Internal Medicine | Admitting: Audiologist

## 2023-01-01 DIAGNOSIS — R6889 Other general symptoms and signs: Secondary | ICD-10-CM | POA: Diagnosis not present

## 2023-01-01 DIAGNOSIS — H903 Sensorineural hearing loss, bilateral: Secondary | ICD-10-CM | POA: Diagnosis not present

## 2023-01-01 NOTE — Procedures (Signed)
  Outpatient Audiology and [facility] [address] [place], [place]  [address] [phone]  AUDIOLOGICAL  EVALUATION  NAME: Pamela Terry     DOB:   February 15, 1946      MRN: [id number]                                                                                     DATE: 01/01/2023     REFERENT: Corwin Levins, MD STATUS: Outpatient DIAGNOSIS: Cognitive Changes, Sensorineural Hearing Loss Bilaterally    History: [name] was seen for an audiological evaluation. [name] was accompanied to the appointment by her husband. [name] has dementia. She is noticing the toll not hearing is having and is tired of asking people to repeat. [name] has difficulty hearing in both ears. This difficulty began gradually. No pain or pressure reported in either ear. Tinnitus denied for both ears. [name]'s husband said she often needs things repeated. No other relevant case history reported.   Evaluation:  Otoscopy showed a clear view of the tympanic membranes, bilaterally Tympanometry results were consistent with norma middle ear function, bilaterally   Audiometric testing was completed using conventional audiometry with supraural transducer. Speech Recognition Thresholds were 35dB in the right ear and 30dB in the left ear. Word Recognition was performed  40dB SL, scored 100% in the right ear and 100% in the left ear. Pure tone thresholds show mild sloping to moderately severe sensorineural hearing loss in both ears.   Results:  The test results were reviewed with [name] and her husband. [name] has a moderately severe high pitched sensorineural hearing loss. She needs hearing aids for both ears. Nature and degree of loss, and impact of hearing aids on cognitive health discussed. [name] is ready to pursue hearing aids.    Recommendations: Amplification is necessary for both ears. Hearing aids can be purchased from a variety of locations. See provided list for locations in the [place] area.    37 minutes spent  testing and counseling on results.   Ammie Ferrier  Audiologist, Au.D., CCC-A 01/01/2023  11:34 AM  Cc: Corwin Levins, MD

## 2023-01-09 ENCOUNTER — Other Ambulatory Visit: Payer: Self-pay | Admitting: Internal Medicine

## 2023-01-09 ENCOUNTER — Other Ambulatory Visit: Payer: Self-pay

## 2023-02-03 ENCOUNTER — Other Ambulatory Visit: Payer: Self-pay | Admitting: Internal Medicine

## 2023-02-03 ENCOUNTER — Other Ambulatory Visit: Payer: Self-pay

## 2023-02-07 ENCOUNTER — Ambulatory Visit
Admission: EM | Admit: 2023-02-07 | Discharge: 2023-02-07 | Disposition: A | Discharge status: Home / Self Care | Payer: Medicare Other | Source: Home / Self Care

## 2023-02-07 DIAGNOSIS — Z961 Presence of intraocular lens: Secondary | ICD-10-CM | POA: Diagnosis not present

## 2023-02-07 DIAGNOSIS — M79661 Pain in right lower leg: Secondary | ICD-10-CM | POA: Diagnosis not present

## 2023-02-07 DIAGNOSIS — H35033 Hypertensive retinopathy, bilateral: Secondary | ICD-10-CM | POA: Diagnosis not present

## 2023-02-07 DIAGNOSIS — H43813 Vitreous degeneration, bilateral: Secondary | ICD-10-CM | POA: Diagnosis not present

## 2023-02-07 DIAGNOSIS — H353231 Exudative age-related macular degeneration, bilateral, with active choroidal neovascularization: Secondary | ICD-10-CM | POA: Diagnosis not present

## 2023-02-07 MED ORDER — ACETAMINOPHEN 325 MG PO TABS: 650.0000 mg | ORAL_TABLET | Freq: Four times a day (QID) | ORAL | 0 refills | Status: DC | PRN

## 2023-02-07 NOTE — Discharge Instructions (Signed)
Your right lower leg pain may be due to varicose veins or ruptured Baker's cyst behind the right knee.  Please make sure that you elevate your leg (to heart level) and use compression socks.  Take Tylenol for pain relief.  Unfortunately I cannot order an ultrasound at this stage in the day or through the weekend.  Please make sure that you follow-up with your PCP as soon as possible so that they can do a recheck with you and consider ordering an ultrasound.  If you develop a red painful swollen right lower leg over the weekend then please go to the emergency room as this may be a sign of a blood clot.

## 2023-02-07 NOTE — ED Triage Notes (Signed)
Pt reports severe right leg pain started today. Pt states she can not put any weight on the right leg. Denies any fall, injury. Pt has not taken any meds for complaints.

## 2023-02-07 NOTE — ED Provider Notes (Signed)
[facility]  Note:  This document was prepared using Conservation officer, historic buildings and may include unintentional dictation errors.  MRN: [id number] DOB: 04-14-1946  Subjective:   Pamela Terry is a 77 y.o. female presenting for acute onset of right lower leg pain from the knee to the ankle when she randomly started walking this morning.  She is able to bear weight but with pain.  Reports that the pain is between the patellar tendon and the front part of her ankle.  No calf swelling, calf redness.  No history of DVT or blood clots.  Patient is not a smoker.  No known history of arthritis in her knees.  No current facility-administered medications for this encounter.  Current Outpatient Medications:    amLODipine (NORVASC) 5 MG tablet, Take 5 mg by mouth daily., Disp: , Rfl:    Calcium Carb-Cholecalciferol (CALTRATE BONE HEALTH PO), Take by mouth., Disp: , Rfl:    albuterol (VENTOLIN HFA) 108 (90 Base) MCG/ACT inhaler, TAKE 2 PUFFS BY MOUTH EVERY 6 HOURS AS NEEDED FOR WHEEZE OR SHORTNESS OF BREATH, Disp: 6.7 each, Rfl: 0   alendronate (FOSAMAX) 70 MG tablet, TAKE 1 TABLET BY MOUTH ONCE A WEEK. TAKE WITH A FULL GLASS OF WATER ON AN EMPTY STOMACH., Disp: 12 tablet, Rfl: 3   Apoaequorin (PREVAGEN) 10 MG CAPS, Take 20 mg by mouth daily., Disp: , Rfl:    aspirin 81 MG tablet, Take 81 mg by mouth daily., Disp: , Rfl:    atorvastatin (LIPITOR) 10 MG tablet, TAKE 1 TABLET BY MOUTH EVERY DAY, Disp: 90 tablet, Rfl: 3   Cholecalciferol (VITAMIN D) 2000 UNITS CAPS, Take 1 capsule by mouth daily., Disp: , Rfl:    citalopram (CELEXA) 10 MG tablet, TAKE 1 TABLET BY MOUTH EVERY DAY, Disp: 90 tablet, Rfl: 3   famotidine (PEPCID) 20 MG tablet, TAKE 1 TABLET BY MOUTH EVERY DAY, Disp: 90 tablet, Rfl: 1   fluconazole (DIFLUCAN) 150 MG tablet, Take 1 tablet (150 mg total) by mouth once a week., Disp: 2 tablet, Rfl: 0   fluticasone (FLONASE) 50 MCG/ACT nasal spray, SPRAY 2 SPRAYS INTO  EACH NOSTRIL EVERY DAY, Disp: 48 mL, Rfl: 3   Fluticasone-Salmeterol 113-14 MCG/ACT AEPB, TAKE 2 PUFFS BY MOUTH TWICE A DAY, Disp: , Rfl:    Fluticasone-Umeclidin-Vilant (TRELEGY ELLIPTA) 100-62.5-25 MCG/ACT AEPB, Inhale 1 puff into the lungs daily., Disp: 28 each, Rfl: 3   ketoconazole (NIZORAL) 2 % cream, Apply 1 Application topically daily., Disp: 60 g, Rfl: 0   LORazepam (ATIVAN) 1 MG tablet, Take 1 tablet (1 mg total) by mouth at bedtime as needed., Disp: 90 tablet, Rfl: 1   memantine (NAMENDA) 10 MG tablet, Take 1 tablet  10 mg 2 times a day, Disp: 180 tablet, Rfl: 4   montelukast (SINGULAIR) 10 MG tablet, TAKE 1 TABLET BY MOUTH EVERY DAY, Disp: 90 tablet, Rfl: 3   pantoprazole (PROTONIX) 40 MG tablet, Take 1 tablet (40 mg total) by mouth daily., Disp: 90 tablet, Rfl: 3   predniSONE (DELTASONE) 10 MG tablet, 2 tabs by mouth per day for 5 days, Disp: 10 tablet, Rfl: 0   QUEtiapine (SEROQUEL) 25 MG tablet, TAKE 1 TABLET BY MOUTH EVERYDAY AT BEDTIME, Disp: 90 tablet, Rfl: 7   QUEtiapine (SEROQUEL) 50 MG tablet, Take 1 tablet (50 mg total) by mouth 2 (two) times daily., Disp: 180 tablet, Rfl: 3   vitamin B-12 (CYANOCOBALAMIN) 1000 MCG tablet, Take 1 tablet (1,000 mcg total) by  mouth daily., Disp: 90 tablet, Rfl: 1   Allergies  Allergen Reactions   Augmentin [Amoxicillin-Pot Clavulanate]     Severe diarrhea   Sulfonamide Derivatives Hives and Rash    Past Medical History:  Diagnosis Date   Acute asthmatic bronchitis    Adenocarcinoma, lung (HCC)    Allergic rhinitis    Anxiety    Carcinoma in situ of endocrine gland    Colon polyp    COPD (chronic obstructive pulmonary disease) (HCC)    Diverticulosis of colon    History of cervical cancer    Hypercholesterolemia    Microscopic hematuria    Osteoporosis      Past Surgical History:  Procedure Laterality Date   ABDOMINAL HYSTERECTOMY     in her 30's   APPENDECTOMY     TOTAL ABDOMINAL HYSTERECTOMY W/ BILATERAL  SALPINGOOPHORECTOMY  1985   Dr Elana Alm for Ca of cervix    Family History  Problem Relation Age of Onset   Asthma Maternal Aunt    Heart disease Father    Hyperlipidemia Father    Stroke Father    Heart disease Paternal Grandfather    Heart disease Maternal Grandmother    Rheum arthritis Mother    Breast cancer Paternal Grandmother    Prostate cancer Maternal Grandfather    Diabetes Brother     Social History   Tobacco Use   Smoking status: Former    Current packs/day: 0.00    Average packs/day: 2.0 packs/day for 23.0 years (46.0 ttl pk-yrs)    Types: Cigarettes    Start date: 06/24/1962    Quit date: 06/24/1985    Years since quitting: 37.6   Smokeless tobacco: Never  Vaping Use   Vaping status: Never Used  Substance Use Topics   Alcohol use: No    Alcohol/week: 0.0 standard drinks of alcohol   Drug use: No    ROS   Objective:   Vitals: BP (!) 166/66 (BP Location: Right Arm)   Pulse (!) 58   Temp 98.8 F (37.1 C) (Oral)   Resp 20   SpO2 91%   Physical Exam Constitutional:      General: She is not in acute distress.    Appearance: Normal appearance. She is well-developed. She is not ill-appearing, toxic-appearing or diaphoretic.  HENT:     Head: Normocephalic and atraumatic.     Nose: Nose normal.     Mouth/Throat:     Mouth: Mucous membranes are moist.  Eyes:     General: No scleral icterus.       Right eye: No discharge.        Left eye: No discharge.     Extraocular Movements: Extraocular movements intact.  Cardiovascular:     Rate and Rhythm: Normal rate.  Pulmonary:     Effort: Pulmonary effort is normal.  Musculoskeletal:       Legs:  Skin:    General: Skin is warm and dry.  Neurological:     General: No focal deficit present.     Mental Status: She is alert and oriented to person, place, and time.  Psychiatric:        Mood and Affect: Mood normal.        Behavior: Behavior normal.     Assessment and Plan :   PDMP not reviewed this  encounter.  1. Pain in right lower leg    Discussed differential with the patient and her caregiver.  Offered an x-ray but they declined and I  do agree as it would be of low yield.  Patient has superficial tenderness and therefore likely symptomatic varicose veins.  She has good range of motion, is able to bear weight and despite her osteoporosis without a lack of trauma, have low suspicion for fracture.  Recommended resting, using compression socks, elevation, Tylenol for pain relief.  I am not able to pursue an ultrasound to evaluate further at this stage as it is late Friday and going through the weekend I am not able to schedule an ultrasound.  Patient has an upcoming appointment with her PCP and advised that they follow-up with the PCP to consider further workup including ultrasound.  Counseled patient on potential for adverse effects with medications prescribed/recommended today, ER and return-to-clinic precautions discussed, patient verbalized understanding.    Wallis Bamberg, [place] 02/07/23 1747

## 2023-02-19 ENCOUNTER — Other Ambulatory Visit: Payer: Self-pay | Admitting: Internal Medicine

## 2023-02-19 ENCOUNTER — Ambulatory Visit: Payer: Medicare Other | Admitting: Emergency Medicine

## 2023-02-28 ENCOUNTER — Encounter: Payer: Self-pay | Admitting: Physician Assistant

## 2023-04-04 DIAGNOSIS — H353231 Exudative age-related macular degeneration, bilateral, with active choroidal neovascularization: Secondary | ICD-10-CM | POA: Diagnosis not present

## 2023-04-14 ENCOUNTER — Other Ambulatory Visit: Payer: Self-pay | Admitting: Internal Medicine

## 2023-04-14 MED ORDER — TRELEGY ELLIPTA 100-62.5-25 MCG/ACT IN AEPB: 1.0000 | INHALATION_SPRAY | Freq: Every day | RESPIRATORY_TRACT | 3 refills | Status: DC

## 2023-04-21 ENCOUNTER — Ambulatory Visit: Payer: Medicare Other | Admitting: Physician Assistant

## 2023-04-23 ENCOUNTER — Other Ambulatory Visit: Payer: Self-pay | Admitting: Internal Medicine

## 2023-04-25 ENCOUNTER — Other Ambulatory Visit: Payer: Self-pay

## 2023-04-29 ENCOUNTER — Encounter: Payer: Self-pay | Admitting: Physician Assistant

## 2023-04-29 ENCOUNTER — Ambulatory Visit: Payer: Medicare Other | Admitting: Physician Assistant

## 2023-04-29 VITALS — BP 140/70 | HR 64 | Resp 20 | Ht 59.0 in | Wt 140.0 lb

## 2023-04-29 DIAGNOSIS — F0150 Vascular dementia without behavioral disturbance: Secondary | ICD-10-CM

## 2023-04-29 NOTE — Progress Notes (Signed)
Assessment/Plan:   Vascular dementia    Pamela Terry is a very pleasant 77 y.o. RH female with a history of COPD, adenocarcinoma of the right lower lobe with lobectomy in 2000, history of cervical cancer 1985, hypertension, hyperlipidemia, anxiety, seen today in follow up for memory loss.  Memory stable with MMSE 21/30.  Patient is currently on memantine 10 mg bid. Unable to add ACHI due to severe GI side effects. She has some fluency difficulty. In the past ST had been recommended but she declined . Patient is able to participate on his ADLs  without difficulties     Follow up in  6 months. Continue Memantine 10 mg twice daily. Side effects were discussed Patient declines speech therapy Recommend good control of her cardiovascular risk factors Continue to control mood as per PCP     Subjective:    This patient is accompanied in the office by her husband  who supplements the history.  Previous records as well as any outside records available were reviewed prior to todays visit. Patient was last seen on 10/2022 with MMSE 19/30    Any changes in memory since last visit? " Good and bad days, maybe a little worse". Patient has some difficulty remembering recent conversations and people names even those that she knows. "I know the word, have a hard time saying it".  She reports that after a small amount of time she is able to retrieve.  She does not like doing brain games. repeats oneself?  Endorsed Disoriented when walking into a room?  Patient denies   Leaving objects?  May misplace things but not in unusual places  Wandering behavior?  denies   Any personality changes since last visit?  As before, she has crying spells episodes.  Any worsening depression?:  Denies.   Hallucinations or paranoia?  I could swear one of my grandchildren were there, could hear, not see them. This happens once in a while. Seizures? denies    Any sleep changes? Sleeps well.  Denies vivid dreams, REM  behavior or sleepwalking   Sleep apnea?   Denies.   Any hygiene concerns? Denies.  Independent of bathing and dressing?  Endorsed  Does the patient needs help with medications? Daughter in law  is in charge  Who is in charge of the finances? Husband  is in charge    Any changes in appetite?  Denies.    Patient have trouble swallowing? Denies.   Does the patient cook? No Any headaches?   denies   Chronic back pain  denies   Ambulates with difficulty? Denies. Walks slower, no shuffling. Walks daily   Recent falls or head injuries? denies     Unilateral weakness, numbness or tingling? denies   Any tremors?  Denies   Any anosmia?  Denies   Any incontinence of urine?  Denies but uses pads "just in case" Any bowel dysfunction?   Denies      Patient lives with husband   Does the patient drive? No longer drives      Initial Visit 03/2021  The patient is seen in neurologic consultation at the request of Corwin Levins, MD for the evaluation of memory.  The patient is accompanied by  who supplements the history.This is a 77 y.o. year old female who has had memory issues for about 4 years, she states that "does not happen all the time what I cannot remember things ", but when he does, she cannot remember simple task's.  For example, this morning "I forgot how to put the bra ".  Also, she states that she cannot think of the word, but until she gives herself some time, and then "it comes back to me ".  She also reports increasing panic attacks.  She has never seen a psychiatrist, but she admits that about 3 years ago, she has similar symptoms, where she drives and she is afraid of going into the other side of the road.  Also, while driving on [address], she feels that she is going to get lost or hit someone, and begins that panic "mode ".  She was "fine for 2 years, and then it hit me again this year ".  Of note, her primary care physician gave her Celexa to try, but refused counseling referral.  She  sleeps well and denies hallucinations, sleepwalking.  She denies any vivid dreams.  "My only paranoia is about getting lost and how to keep my car in the lane".  She denies leaving objects in unusual places, but she does leave these objects anywhere in the house and then she cannot remember where she left them.  She denies any issues with bathing and dressing, or medications.  Her husband took over the finances, after she was found over paying "I have an issue with the zeros, I cannot read 100 versus 1000" and has been overpaying.  Her appetite is good, and denies trouble swallowing.  She cooks and denies leaving the stove or the faucet on.  She ambulates without difficulty without the use of a walker or a cane, denies falls or head injuries. Denies headaches, double vision, dizziness, focal numbness or tingling, unilateral weakness or tremors. Denies urine incontinence or retention. Denies constipation or diarrhea.  She denies anosmia, history of OSA, alcohol or tobacco use.  She stopped smoking many years ago, 46-year pack history.  Family history remarkable for brother and mother with vascular dementia.  She has been under significant amount of stress, after taking care of many family members "a lot on my shoulders, including my mother dying with melanoma ".  My brother is also very ill and I am taking care of him, everybody is taking advantage of me".   Labs 2/ 9, 2022 show vitamin B12 679 TSH 1.17    MRI of the brain on 03/11/2021 shows scattered foci of susceptibility throughout the bilateral cerebral hemispheres and right cerebellar hemisphere, nonspecific but can be seen in setting of cerebral amyloid angiopathy. In addition osseous metastatic disease in the setting of a history of lung cancer but could also represent a focus of fibrous dysplasia. Mildly advanced cerebral volume loss for age, with sequela of severe chronic small vessel ischemic disease in the periventricular white matter and pons was  noted.   PREVIOUS MEDICATIONS: donepezil, rivastigmine (severe GI side effects)   CURRENT MEDICATIONS:  Outpatient Encounter Medications as of 04/29/2023  Medication Sig   acetaminophen (TYLENOL) 325 MG tablet Take 2 tablets (650 mg total) by mouth every 6 (six) hours as needed for moderate pain.   alendronate (FOSAMAX) 70 MG tablet TAKE 1 TABLET BY MOUTH ONCE A WEEK. TAKE WITH A FULL GLASS OF WATER ON AN EMPTY STOMACH.   amLODipine (NORVASC) 5 MG tablet TAKE 1 TABLET (5 MG TOTAL) BY MOUTH DAILY.   Apoaequorin (PREVAGEN) 10 MG CAPS Take 20 mg by mouth daily.   aspirin 81 MG tablet Take 81 mg by mouth daily.   atorvastatin (LIPITOR) 10 MG tablet TAKE 1 TABLET BY  MOUTH EVERY DAY   Calcium Carb-Cholecalciferol (CALTRATE BONE HEALTH PO) Take by mouth.   Cholecalciferol (VITAMIN D) 2000 UNITS CAPS Take 1 capsule by mouth daily.   citalopram (CELEXA) 10 MG tablet TAKE 1 TABLET BY MOUTH EVERY DAY   famotidine (PEPCID) 20 MG tablet TAKE 1 TABLET BY MOUTH EVERY DAY   fluconazole (DIFLUCAN) 150 MG tablet Take 1 tablet (150 mg total) by mouth once a week.   fluticasone (FLONASE) 50 MCG/ACT nasal spray SPRAY 2 SPRAYS INTO EACH NOSTRIL EVERY DAY   Fluticasone-Salmeterol 113-14 MCG/ACT AEPB TAKE 2 PUFFS BY MOUTH TWICE A DAY   Fluticasone-Umeclidin-Vilant (TRELEGY ELLIPTA) 100-62.5-25 MCG/ACT AEPB Inhale 1 puff into the lungs daily.   ketoconazole (NIZORAL) 2 % cream Apply 1 Application topically daily.   LORazepam (ATIVAN) 1 MG tablet Take 1 tablet (1 mg total) by mouth at bedtime as needed.   memantine (NAMENDA) 10 MG tablet Take 1 tablet  10 mg 2 times a day   montelukast (SINGULAIR) 10 MG tablet TAKE 1 TABLET BY MOUTH EVERY DAY   pantoprazole (PROTONIX) 40 MG tablet Take 1 tablet (40 mg total) by mouth daily.   predniSONE (DELTASONE) 10 MG tablet 2 tabs by mouth per day for 5 days   QUEtiapine (SEROQUEL) 25 MG tablet TAKE 1 TABLET BY MOUTH EVERYDAY AT BEDTIME   QUEtiapine (SEROQUEL) 50 MG tablet TAKE  1 TABLET BY MOUTH TWICE A DAY   vitamin B-12 (CYANOCOBALAMIN) 1000 MCG tablet Take 1 tablet (1,000 mcg total) by mouth daily.   No facility-administered encounter medications on file as of 04/29/2023.       04/29/2023   12:00 PM 10/18/2022   10:00 AM 04/03/2022    9:00 AM  MMSE - Mini Mental State Exam  Orientation to time 4 5 3   Orientation to Place 3 1 4   Registration 3 3 3   Attention/ Calculation 0 0 3  Recall 3 3 3   Language- name 2 objects 2 1 2   Language- repeat 1 1 0  Language- follow 3 step command 3 3 2   Language- read & follow direction 1 1 1   Write a sentence 1 1 1   Copy design 0 0 0  Total score 21 19 22       04/01/2021   11:00 AM  Montreal Cognitive Assessment   Visuospatial/ Executive (0/5) 0  Naming (0/3) 2  Attention: Read list of digits (0/2) 2  Attention: Read list of letters (0/1) 1  Attention: Serial 7 subtraction starting at 100 (0/3) 1  Language: Repeat phrase (0/2) 0  Language : Fluency (0/1) 1  Abstraction (0/2) 1  Delayed Recall (0/5) 5  Orientation (0/6) 5  Total 18  Adjusted Score (based on education) 18    Objective:     PHYSICAL EXAMINATION:    VITALS:   Vitals:   04/29/23 0913 04/29/23 1024  BP: (!) 180/79 (!) 140/70  Pulse: 64   Resp: 20   SpO2: 97%   Weight: 140 lb (63.5 kg)   Height: 4\' 11"  (1.499 m)     GEN:  The patient appears stated age and is in NAD. HEENT:  Normocephalic, atraumatic.   Neurological examination:  General: NAD, well-groomed, appears stated age. Orientation: The patient is alert. Oriented to person,place and date Cranial nerves: There is good facial symmetry.The speech is not fluent but clear. No aphasia or dysarthria. Fund of knowledge is appropriate. Recent and remote memory are impaired. Attention and concentration are reduced.  Able to name objects and  repeat phrases.  Hearing is intact to conversational tone.  Sensation: Sensation is intact to light touch throughout Motor: Strength is at least  antigravity x4. DTR's 2/4 in UE/LE     Movement examination: Tone: There is normal tone in the UE/LE Abnormal movements:  no tremor.  No myoclonus.  No asterixis.   Coordination:  There is no decremation with RAM's. Abnormal FTN  R. Normal FTN L Gait and Station: The patient has no difficulty arising out of a deep-seated chair without the use of the hands. The patient's stride length is good.  Gait is cautious and narrow.    Thank you for allowing [place] the opportunity to participate in the care of this nice patient. Please do not hesitate to contact us for any questions or concerns.   Total time spent on today's visit was 28 minutes dedicated to this patient today, preparing to see patient, examining the patient, ordering tests and/or medications and counseling the patient, documenting clinical information in the EHR or other health record, independently interpreting results and communicating results to the patient/family, discussing treatment and goals, answering patient's questions and coordinating care.  Cc:  Corwin Levins, MD  Marlowe Kays 04/29/2023 12:56 PM

## 2023-04-29 NOTE — Patient Instructions (Addendum)
It was a pleasure to see you today at our office.   Recommendations:  Follow up in 6  months Continue  memantine to 10 mg bid    Whom to call:  Memory  decline, memory medications: Call our office [phone]   For psychiatric meds, mood meds: Please have your primary care physician manage these medications.     For assessment of decision of mental capacity and competency:  Call Dr. Erick Blinks, geriatric psychiatrist at [phone]  For guidance in geriatric dementia issues please call [facility] [phone]    If you have any severe symptoms of a stroke, or other severe issues such as confusion,severe chills or fever, etc call 911 or go to the ER as you may need to be evaluated further          RECOMMENDATIONS FOR ALL PATIENTS WITH MEMORY PROBLEMS: 1. Continue to exercise (Recommend 30 minutes of walking everyday, or 3 hours every week) 2. Increase social interactions - continue going to [place] and enjoy social gatherings with friends and family 3. Eat healthy, avoid fried foods and eat more fruits and vegetables 4. Maintain adequate blood pressure, blood sugar, and blood cholesterol level. Reducing the risk of stroke and cardiovascular disease also helps promoting better memory. 5. Avoid stressful situations. Live a simple life and avoid aggravations. Organize your time and prepare for the next day in anticipation. 6. Sleep well, avoid any interruptions of sleep and avoid any distractions in the bedroom that may interfere with adequate sleep quality 7. Avoid sugar, avoid sweets as there is a strong link between excessive sugar intake, diabetes, and cognitive impairment We discussed the Mediterranean diet, which has been shown to help patients reduce the risk of progressive memory disorders and reduces cardiovascular risk. This includes eating fish, eat fruits and green leafy vegetables, nuts like almonds and hazelnuts, walnuts, and also use olive oil. Avoid  fast foods and fried foods as much as possible. Avoid sweets and sugar as sugar use has been linked to worsening of memory function.  There is always a concern of gradual progression of memory problems. If this is the case, then we may need to adjust level of care according to patient needs. Support, both to the patient and caregiver, should then be put into place.    FALL PRECAUTIONS: Be cautious when walking. Scan the area for obstacles that may increase the risk of trips and falls. When getting up in the mornings, sit up at the edge of the bed for a few minutes before getting out of bed. Consider elevating the bed at the head end to avoid drop of blood pressure when getting up. Walk always in a well-lit room (use night lights in the walls). Avoid area rugs or power cords from appliances in the middle of the walkways. Use a walker or a cane if necessary and consider physical therapy for balance exercise. Get your eyesight checked regularly.  FINANCIAL OVERSIGHT: Supervision, especially oversight when making financial decisions or transactions is also recommended.  HOME SAFETY: Consider the safety of the kitchen when operating appliances like stoves, microwave oven, and blender. Consider having supervision and share cooking responsibilities until no longer able to participate in those. Accidents with firearms and other hazards in the house should be identified and addressed as well.   ABILITY TO BE LEFT ALONE: If patient is unable to contact 911 operator, consider using [facility], or when the need is there, arrange for someone to stay with patients. Smoking is  a fire hazard, consider supervision or cessation. Risk of wandering should be assessed by caregiver and if detected at any point, supervision and safe proof recommendations should be instituted.  MEDICATION SUPERVISION: Inability to self-administer medication needs to be constantly addressed. Implement a mechanism to ensure safe administration of  the medications.

## 2023-05-30 DIAGNOSIS — H353231 Exudative age-related macular degeneration, bilateral, with active choroidal neovascularization: Secondary | ICD-10-CM | POA: Diagnosis not present

## 2023-06-26 ENCOUNTER — Other Ambulatory Visit: Payer: Self-pay

## 2023-06-26 ENCOUNTER — Other Ambulatory Visit: Payer: Self-pay | Admitting: Internal Medicine

## 2023-07-17 ENCOUNTER — Ambulatory Visit: Payer: Medicare Other | Admitting: Internal Medicine

## 2023-07-25 DIAGNOSIS — H35033 Hypertensive retinopathy, bilateral: Secondary | ICD-10-CM | POA: Diagnosis not present

## 2023-07-25 DIAGNOSIS — Z961 Presence of intraocular lens: Secondary | ICD-10-CM | POA: Diagnosis not present

## 2023-07-25 DIAGNOSIS — H353231 Exudative age-related macular degeneration, bilateral, with active choroidal neovascularization: Secondary | ICD-10-CM | POA: Diagnosis not present

## 2023-07-25 DIAGNOSIS — H43813 Vitreous degeneration, bilateral: Secondary | ICD-10-CM | POA: Diagnosis not present

## 2023-09-08 DIAGNOSIS — H353231 Exudative age-related macular degeneration, bilateral, with active choroidal neovascularization: Secondary | ICD-10-CM | POA: Diagnosis not present

## 2023-09-12 ENCOUNTER — Emergency Department (HOSPITAL_COMMUNITY)

## 2023-09-12 ENCOUNTER — Encounter (HOSPITAL_COMMUNITY): Payer: Self-pay

## 2023-09-12 ENCOUNTER — Other Ambulatory Visit: Payer: Self-pay

## 2023-09-12 ENCOUNTER — Inpatient Hospital Stay (HOSPITAL_COMMUNITY)
Admission: EM | Admit: 2023-09-12 | Discharge: 2023-09-18 | DRG: 180 | Disposition: A | Discharge status: Hospice | Attending: Internal Medicine | Admitting: Internal Medicine

## 2023-09-12 DIAGNOSIS — M810 Age-related osteoporosis without current pathological fracture: Secondary | ICD-10-CM | POA: Diagnosis not present

## 2023-09-12 DIAGNOSIS — J189 Pneumonia, unspecified organism: Secondary | ICD-10-CM

## 2023-09-12 DIAGNOSIS — Z825 Family history of asthma and other chronic lower respiratory diseases: Secondary | ICD-10-CM

## 2023-09-12 DIAGNOSIS — J4489 Other specified chronic obstructive pulmonary disease: Secondary | ICD-10-CM

## 2023-09-12 DIAGNOSIS — I2609 Other pulmonary embolism with acute cor pulmonale: Principal | ICD-10-CM

## 2023-09-12 DIAGNOSIS — Z881 Allergy status to other antibiotic agents status: Secondary | ICD-10-CM

## 2023-09-12 DIAGNOSIS — I444 Left anterior fascicular block: Secondary | ICD-10-CM | POA: Diagnosis not present

## 2023-09-12 DIAGNOSIS — C3491 Malignant neoplasm of unspecified part of right bronchus or lung: Principal | ICD-10-CM | POA: Diagnosis present

## 2023-09-12 DIAGNOSIS — J439 Emphysema, unspecified: Secondary | ICD-10-CM | POA: Diagnosis present

## 2023-09-12 DIAGNOSIS — R0602 Shortness of breath: Secondary | ICD-10-CM | POA: Diagnosis not present

## 2023-09-12 DIAGNOSIS — Z880 Allergy status to penicillin: Secondary | ICD-10-CM

## 2023-09-12 DIAGNOSIS — Z86008 Personal history of in-situ neoplasm of other site: Secondary | ICD-10-CM

## 2023-09-12 DIAGNOSIS — Z882 Allergy status to sulfonamides status: Secondary | ICD-10-CM

## 2023-09-12 DIAGNOSIS — Z87891 Personal history of nicotine dependence: Secondary | ICD-10-CM

## 2023-09-12 DIAGNOSIS — J910 Malignant pleural effusion: Secondary | ICD-10-CM | POA: Diagnosis not present

## 2023-09-12 DIAGNOSIS — Z8541 Personal history of malignant neoplasm of cervix uteri: Secondary | ICD-10-CM

## 2023-09-12 DIAGNOSIS — R079 Chest pain, unspecified: Secondary | ICD-10-CM | POA: Diagnosis not present

## 2023-09-12 DIAGNOSIS — C3490 Malignant neoplasm of unspecified part of unspecified bronchus or lung: Secondary | ICD-10-CM | POA: Diagnosis not present

## 2023-09-12 DIAGNOSIS — F0393 Unspecified dementia, unspecified severity, with mood disturbance: Secondary | ICD-10-CM | POA: Diagnosis present

## 2023-09-12 DIAGNOSIS — H9193 Unspecified hearing loss, bilateral: Secondary | ICD-10-CM | POA: Diagnosis present

## 2023-09-12 DIAGNOSIS — Z7901 Long term (current) use of anticoagulants: Secondary | ICD-10-CM | POA: Diagnosis not present

## 2023-09-12 DIAGNOSIS — Z515 Encounter for palliative care: Secondary | ICD-10-CM | POA: Diagnosis not present

## 2023-09-12 DIAGNOSIS — R7402 Elevation of levels of lactic acid dehydrogenase (LDH): Secondary | ICD-10-CM | POA: Diagnosis not present

## 2023-09-12 DIAGNOSIS — Z7951 Long term (current) use of inhaled steroids: Secondary | ICD-10-CM

## 2023-09-12 DIAGNOSIS — Z90722 Acquired absence of ovaries, bilateral: Secondary | ICD-10-CM

## 2023-09-12 DIAGNOSIS — E7800 Pure hypercholesterolemia, unspecified: Secondary | ICD-10-CM | POA: Diagnosis not present

## 2023-09-12 DIAGNOSIS — Z902 Acquired absence of lung [part of]: Secondary | ICD-10-CM

## 2023-09-12 DIAGNOSIS — Z8261 Family history of arthritis: Secondary | ICD-10-CM

## 2023-09-12 DIAGNOSIS — Z85118 Personal history of other malignant neoplasm of bronchus and lung: Secondary | ICD-10-CM

## 2023-09-12 DIAGNOSIS — J90 Pleural effusion, not elsewhere classified: Secondary | ICD-10-CM | POA: Diagnosis not present

## 2023-09-12 DIAGNOSIS — I2699 Other pulmonary embolism without acute cor pulmonale: Secondary | ICD-10-CM | POA: Diagnosis not present

## 2023-09-12 DIAGNOSIS — Z823 Family history of stroke: Secondary | ICD-10-CM

## 2023-09-12 DIAGNOSIS — Z8042 Family history of malignant neoplasm of prostate: Secondary | ICD-10-CM

## 2023-09-12 DIAGNOSIS — I700 Atherosclerosis of aorta: Secondary | ICD-10-CM | POA: Diagnosis not present

## 2023-09-12 DIAGNOSIS — Z48813 Encounter for surgical aftercare following surgery on the respiratory system: Secondary | ICD-10-CM | POA: Diagnosis not present

## 2023-09-12 DIAGNOSIS — F0394 Unspecified dementia, unspecified severity, with anxiety: Secondary | ICD-10-CM | POA: Diagnosis present

## 2023-09-12 DIAGNOSIS — Z8249 Family history of ischemic heart disease and other diseases of the circulatory system: Secondary | ICD-10-CM

## 2023-09-12 DIAGNOSIS — Z1152 Encounter for screening for COVID-19: Secondary | ICD-10-CM

## 2023-09-12 DIAGNOSIS — Z7982 Long term (current) use of aspirin: Secondary | ICD-10-CM

## 2023-09-12 DIAGNOSIS — Z803 Family history of malignant neoplasm of breast: Secondary | ICD-10-CM

## 2023-09-12 DIAGNOSIS — Z79899 Other long term (current) drug therapy: Secondary | ICD-10-CM

## 2023-09-12 DIAGNOSIS — Z83438 Family history of other disorder of lipoprotein metabolism and other lipidemia: Secondary | ICD-10-CM

## 2023-09-12 DIAGNOSIS — Z860100 Personal history of colon polyps, unspecified: Secondary | ICD-10-CM

## 2023-09-12 DIAGNOSIS — C539 Malignant neoplasm of cervix uteri, unspecified: Secondary | ICD-10-CM | POA: Diagnosis present

## 2023-09-12 DIAGNOSIS — I10 Essential (primary) hypertension: Secondary | ICD-10-CM | POA: Diagnosis present

## 2023-09-12 DIAGNOSIS — F32A Depression, unspecified: Secondary | ICD-10-CM | POA: Diagnosis present

## 2023-09-12 DIAGNOSIS — J440 Chronic obstructive pulmonary disease with acute lower respiratory infection: Secondary | ICD-10-CM | POA: Diagnosis not present

## 2023-09-12 DIAGNOSIS — J159 Unspecified bacterial pneumonia: Secondary | ICD-10-CM | POA: Diagnosis present

## 2023-09-12 DIAGNOSIS — J9601 Acute respiratory failure with hypoxia: Secondary | ICD-10-CM | POA: Diagnosis not present

## 2023-09-12 DIAGNOSIS — Z833 Family history of diabetes mellitus: Secondary | ICD-10-CM

## 2023-09-12 DIAGNOSIS — Z90710 Acquired absence of both cervix and uterus: Secondary | ICD-10-CM

## 2023-09-12 DIAGNOSIS — Z86711 Personal history of pulmonary embolism: Secondary | ICD-10-CM

## 2023-09-12 DIAGNOSIS — R918 Other nonspecific abnormal finding of lung field: Secondary | ICD-10-CM | POA: Diagnosis not present

## 2023-09-12 DIAGNOSIS — Z7983 Long term (current) use of bisphosphonates: Secondary | ICD-10-CM

## 2023-09-12 LAB — CBC
HCT: 49.3 % — ABNORMAL HIGH (ref 36.0–46.0)
Hemoglobin: 15.7 g/dL — ABNORMAL HIGH (ref 12.0–15.0)
MCH: 28.6 pg (ref 26.0–34.0)
MCHC: 31.8 g/dL (ref 30.0–36.0)
MCV: 90 fL (ref 80.0–100.0)
Platelets: 330 10*3/uL (ref 150–400)
RBC: 5.48 MIL/uL — ABNORMAL HIGH (ref 3.87–5.11)
RDW: 13.8 % (ref 11.5–15.5)
WBC: 13.1 10*3/uL — ABNORMAL HIGH (ref 4.0–10.5)
nRBC: 0 % (ref 0.0–0.2)

## 2023-09-12 LAB — TROPONIN I (HIGH SENSITIVITY)
Troponin I (High Sensitivity): 10 ng/L (ref <18)
Troponin I (High Sensitivity): 12 ng/L (ref <18)

## 2023-09-12 LAB — COMPREHENSIVE METABOLIC PANEL
ALT: 22 U/L (ref 0–44)
AST: 29 U/L (ref 15–41)
Albumin: 3.4 g/dL — ABNORMAL LOW (ref 3.5–5.0)
Alkaline Phosphatase: 83 U/L (ref 38–126)
Anion gap: 11 (ref 5–15)
BUN: 14 mg/dL (ref 8–23)
CO2: 23 mmol/L (ref 22–32)
Calcium: 9 mg/dL (ref 8.9–10.3)
Chloride: 106 mmol/L (ref 98–111)
Creatinine, Ser: 1.08 mg/dL — ABNORMAL HIGH (ref 0.44–1.00)
GFR, Estimated: 53 mL/min — ABNORMAL LOW (ref >60)
Glucose, Bld: 92 mg/dL (ref 70–99)
Potassium: 4.3 mmol/L (ref 3.5–5.1)
Sodium: 140 mmol/L (ref 135–145)
Total Bilirubin: 0.9 mg/dL (ref 0.0–1.2)
Total Protein: 7.2 g/dL (ref 6.5–8.1)

## 2023-09-12 LAB — RESP PANEL BY RT-PCR (RSV, FLU A&B, COVID)  RVPGX2
Influenza A by PCR: NEGATIVE
Influenza B by PCR: NEGATIVE
Resp Syncytial Virus by PCR: NEGATIVE
SARS Coronavirus 2 by RT PCR: NEGATIVE

## 2023-09-12 LAB — BRAIN NATRIURETIC PEPTIDE: B Natriuretic Peptide: 27.7 pg/mL (ref 0.0–100.0)

## 2023-09-12 LAB — D-DIMER, QUANTITATIVE: D-Dimer, Quant: 9.06 ug{FEU}/mL — ABNORMAL HIGH (ref 0.00–0.50)

## 2023-09-12 MED ORDER — SODIUM CHLORIDE 0.9 % IV SOLN
1.0000 g | Freq: Once | INTRAVENOUS | Status: AC
  Administered 2023-09-13: 1 g via INTRAVENOUS
  Filled 2023-09-12: qty 10

## 2023-09-12 MED ORDER — HEPARIN (PORCINE) 25000 UT/250ML-% IV SOLN
1000.0000 [IU]/h | INTRAVENOUS | Status: DC
  Administered 2023-09-13: 1000 [IU]/h via INTRAVENOUS
  Filled 2023-09-12: qty 250

## 2023-09-12 MED ORDER — SODIUM CHLORIDE 0.9 % IV SOLN
500.0000 mg | Freq: Once | INTRAVENOUS | Status: AC
  Administered 2023-09-13: 500 mg via INTRAVENOUS
  Filled 2023-09-12: qty 5

## 2023-09-12 MED ORDER — HEPARIN BOLUS VIA INFUSION
3500.0000 [IU] | Freq: Once | INTRAVENOUS | Status: AC
  Administered 2023-09-13: 3500 [IU] via INTRAVENOUS
  Filled 2023-09-12: qty 3500

## 2023-09-12 MED ORDER — IOHEXOL 350 MG/ML SOLN
75.0000 mL | Freq: Once | INTRAVENOUS | Status: AC | PRN
  Administered 2023-09-12: 75 mL via INTRAVENOUS

## 2023-09-12 NOTE — ED Provider Triage Note (Signed)
 Emergency Medicine Provider Triage Evaluation Note  Pamela Terry , a 78 y.o. female  was evaluated in triage.  Pt complains of chest tightness, sob, chest congestion, cough generally non prod but occasional yellowish sputum - symptoms worsening in past month. Remote hx smoking, no recent. Remote hx lung cancer/lobectomy.  No leg pain/swelling. No specific known ill contacts.   Review of Systems  Positive: Sob. Chest tightness.  Negative: Abd pain, nv.   Physical Exam  BP (!) 171/78 (BP Location: Right Arm)   Pulse 83   Temp 98.1 F (36.7 C)   Resp 16   Ht 1.499 m (4\' 11" )   Wt 63.5 kg   SpO2 (!) 88%   BMI 28.27 kg/m  Gen:   Awake, mild resp distress.  Resp:  Sob/dyspneic. Diminished bs bil. rrr MSK:   No focal or unilateral leg pain or swelling.   Medical Decision Making  Medically screening exam initiated at 5:49 PM.  Appropriate orders placed.  Pamela Terry was informed that the remainder of the evaluation will be completed by another provider, this initial triage assessment does not replace that evaluation, and the importance of remaining in the ED until their evaluation is complete.  Labs and imaging ordered. Ecg.     Cathren Laine, MD 09/12/23 [phone]

## 2023-09-12 NOTE — ED Notes (Signed)
Pt placed on 2L 02 per Gerald

## 2023-09-12 NOTE — ED Notes (Signed)
 Patient transported to CT

## 2023-09-12 NOTE — Progress Notes (Signed)
 ANTICOAGULATION CONSULT NOTE  Pharmacy Consult for Heparin Indication: pulmonary embolus  Allergies  Allergen Reactions   Augmentin [Amoxicillin-Pot Clavulanate]     Severe diarrhea   Sulfonamide Derivatives Hives and Rash    Patient Measurements: Height: 4\' 11"  (149.9 cm) Weight: 63.5 kg (139 lb 15.9 oz) IBW/kg (Calculated) : 43.2 Heparin Dosing Weight: 56.8 kg  Vital Signs: Temp: 98.1 F (36.7 C) (03/21 1738) BP: 172/67 (03/21 2100) Pulse Rate: 71 (03/21 2100)  Labs: Recent Labs    09/12/23 1740 09/12/23 2039  HGB 15.7*  --   HCT 49.3*  --   PLT 330  --   CREATININE 1.08*  --   TROPONINIHS 10 12    Estimated Creatinine Clearance: 35.9 mL/min (A) (by C-G formula based on SCr of 1.08 mg/dL (H)).   Medical History: Past Medical History:  Diagnosis Date   Acute asthmatic bronchitis    Adenocarcinoma, lung (HCC)    Allergic rhinitis    Anxiety    Carcinoma in situ of endocrine gland    Colon polyp    COPD (chronic obstructive pulmonary disease) (HCC)    Diverticulosis of colon    History of cervical cancer    Hypercholesterolemia    Microscopic hematuria    Osteoporosis     Medications:  (Not in a hospital admission)  Scheduled:  Infusions:   azithromycin     cefTRIAXone (ROCEPHIN)  IV     PRN:   Assessment: 29 yof with a history of COPD, lung adenocarcinoma status resection . Patient is presenting with midsternal chest pain, SOB, productive cough . Heparin per pharmacy consult placed for pulmonary embolus.  CTA PE w/ peripheral pulmonary emboli on left w/out RHS  Patient is not on anticoagulation prior to arrival.  Hgb 15.7; plt 330 D-Dimer 9.06  Goal of Therapy:  Heparin level 0.3-0.7 units/ml Monitor platelets by anticoagulation protocol: Yes   Plan:  Give IV heparin 3500 units bolus x 1 Start heparin infusion at 1000 units/hr Check anti-Xa level in 8 hours and daily while on heparin Continue to monitor H&H and platelets  Delmar Landau, PharmD, BCPS 09/12/2023 10:53 PM ED Clinical Pharmacist -  [phone]

## 2023-09-12 NOTE — ED Provider Notes (Signed)
 [place] EMERGENCY DEPARTMENT AT [facility] Provider Note   CSN: [id number] Arrival date & time: 09/12/23  1725     History  Chief Complaint  Patient presents with   Chest Pain   Cough   Shortness of Breath    Pamela Terry is a 78 y.o. female with a past medical history of COPD, lung adenocarcinoma status resection presents with chest pain, shortness of breath and productive cough x 1 month.  Symptoms have progressively worsened.  Notably worse with exertion.  No cardiac history or history of blood clots.  Patient did have some dental work done back in October and she is concerned it may be involved.   Chest Pain Associated symptoms: cough and shortness of breath   Cough Associated symptoms: chest pain and shortness of breath   Shortness of Breath Associated symptoms: chest pain and cough       Past Medical History:  Diagnosis Date   Acute asthmatic bronchitis    Adenocarcinoma, lung (HCC)    Allergic rhinitis    Anxiety    Carcinoma in situ of endocrine gland    Colon polyp    COPD (chronic obstructive pulmonary disease) (HCC)    Diverticulosis of colon    History of cervical cancer    Hypercholesterolemia    Microscopic hematuria    Osteoporosis      Home Medications Prior to Admission medications   Medication Sig Start Date End Date Taking? Authorizing Provider  acetaminophen (TYLENOL) 325 MG tablet Take 2 tablets (650 mg total) by mouth every 6 (six) hours as needed for moderate pain. 02/07/23   Wallis Bamberg, PA-C  alendronate (FOSAMAX) 70 MG tablet TAKE 1 TABLET BY MOUTH ONCE A WEEK. TAKE WITH A FULL GLASS OF WATER ON AN EMPTY STOMACH. 11/25/22   Corwin Levins, MD  amLODipine (NORVASC) 5 MG tablet TAKE 1 TABLET (5 MG TOTAL) BY MOUTH DAILY. 02/20/23   Corwin Levins, MD  Apoaequorin (PREVAGEN) 10 MG CAPS Take 20 mg by mouth daily.    [provider]  aspirin 81 MG tablet Take 81 mg by mouth daily.    [provider]  atorvastatin  (LIPITOR) 10 MG tablet TAKE 1 TABLET BY MOUTH EVERY DAY 06/26/23   Corwin Levins, MD  Calcium Carb-Cholecalciferol (CALTRATE BONE HEALTH PO) Take by mouth.    [provider]  Cholecalciferol (VITAMIN D) 2000 UNITS CAPS Take 1 capsule by mouth daily.    [provider]  citalopram (CELEXA) 10 MG tablet TAKE 1 TABLET BY MOUTH EVERY DAY 11/25/22   Corwin Levins, MD  famotidine (PEPCID) 20 MG tablet TAKE 1 TABLET BY MOUTH EVERY DAY 01/09/23   Corwin Levins, MD  fluconazole (DIFLUCAN) 150 MG tablet Take 1 tablet (150 mg total) by mouth once a week. 08/25/22   Wallis Bamberg, PA-C  fluticasone [facility]) 50 MCG/ACT nasal spray SPRAY 2 SPRAYS INTO EACH NOSTRIL EVERY DAY 08/20/20   Corwin Levins, MD  Fluticasone-Salmeterol 113-14 MCG/ACT AEPB TAKE 2 PUFFS BY MOUTH TWICE A DAY    [provider]  Fluticasone-Umeclidin-Vilant (TRELEGY ELLIPTA) 100-62.5-25 MCG/ACT AEPB Inhale 1 puff into the lungs daily. 04/14/23   Corwin Levins, MD  ketoconazole (NIZORAL) 2 % cream Apply 1 Application topically daily. 08/25/22   Wallis Bamberg, PA-C  LORazepam (ATIVAN) 1 MG tablet Take 1 tablet (1 mg total) by mouth at bedtime as needed. 12/12/20   Corwin Levins, MD  memantine (NAMENDA) 10 MG  tablet Take 1 tablet  10 mg 2 times a day 10/18/22   Marcos Eke, PA-C  montelukast (SINGULAIR) 10 MG tablet TAKE 1 TABLET BY MOUTH EVERY DAY 09/19/22   Corwin Levins, MD  pantoprazole (PROTONIX) 40 MG tablet Take 1 tablet (40 mg total) by mouth daily. 12/19/22   Corwin Levins, MD  predniSONE (DELTASONE) 10 MG tablet 2 tabs by mouth per day for 5 days 12/19/22   Corwin Levins, MD  QUEtiapine (SEROQUEL) 25 MG tablet TAKE 1 TABLET BY MOUTH EVERYDAY AT BEDTIME 11/25/22   Corwin Levins, MD  QUEtiapine (SEROQUEL) 50 MG tablet TAKE 1 TABLET BY MOUTH TWICE A DAY 04/25/23   Corwin Levins, MD  vitamin B-12 (CYANOCOBALAMIN) 1000 MCG tablet Take 1 tablet (1,000 mcg total) by mouth daily. 07/18/19   Corwin Levins, MD      Allergies     Augmentin [amoxicillin-pot clavulanate] and Sulfonamide derivatives    Review of Systems   Review of Systems  Respiratory:  Positive for cough and shortness of breath.   Cardiovascular:  Positive for chest pain.    Physical Exam Updated Vital Signs BP (!) 172/67   Pulse 71   Temp 98.1 F (36.7 C)   Resp 17   Ht 4\' 11"  (1.499 m)   Wt 63.5 kg   SpO2 98%   BMI 28.27 kg/m  Physical Exam Vitals and nursing note reviewed.  Constitutional:      General: She is not in acute distress.    Appearance: She is well-developed.  HENT:     Head: Normocephalic and atraumatic.  Eyes:     Conjunctiva/sclera: Conjunctivae normal.  Cardiovascular:     Rate and Rhythm: Normal rate and regular rhythm.     Heart sounds: No murmur heard. Pulmonary:     Breath sounds: Normal breath sounds.     Comments: Right lower lobe Rales, no wheezes appreciated, currently requiring 2 L of oxygen Abdominal:     Palpations: Abdomen is soft.     Tenderness: There is no abdominal tenderness. There is no guarding or rebound.  Musculoskeletal:        General: No swelling.     Cervical back: Neck supple.  Skin:    General: Skin is warm and dry.     Capillary Refill: Capillary refill takes less than 2 seconds.  Neurological:     Mental Status: She is alert.  Psychiatric:        Mood and Affect: Mood normal.     ED Results / Procedures / Treatments   Labs (all labs ordered are listed, but only abnormal results are displayed) Labs Reviewed  COMPREHENSIVE METABOLIC PANEL - Abnormal; Notable for the following components:      Result Value   Creatinine, Ser 1.08 (*)    Albumin 3.4 (*)    GFR, Estimated 53 (*)    All other components within normal limits  CBC - Abnormal; Notable for the following components:   WBC 13.1 (*)    RBC 5.48 (*)    Hemoglobin 15.7 (*)    HCT 49.3 (*)    All other components within normal limits  D-DIMER, QUANTITATIVE - Abnormal; Notable for the following components:    D-Dimer, Quant 9.06 (*)    All other components within normal limits  RESP PANEL BY RT-PCR (RSV, FLU A&B, COVID)  RVPGX2  BRAIN NATRIURETIC PEPTIDE  HEPARIN LEVEL (UNFRACTIONATED)  TROPONIN I (HIGH SENSITIVITY)  TROPONIN I (HIGH SENSITIVITY)  EKG EKG Interpretation Date/Time:  Friday September 12 2023 17:46:16 EDT Ventricular Rate:  81 PR Interval:  140 QRS Duration:  74 QT Interval:  390 QTC Calculation: 453 R Axis:   -74  Text Interpretation: Normal sinus rhythm Left anterior fascicular block Nonspecific ST abnormality Nonspecific T wave abnormality Artifact Confirmed by Cathren Laine ([id number]) on 09/12/2023 5:51:06 PM  Radiology CT Angio Chest PE W/Cm &/Or Wo Cm Result Date: 09/12/2023 CLINICAL DATA:  Pulmonary embolus suspected with low to intermediate probability. Positive D-dimer. Chest pain, cough, and shortness of breath. EXAM: CT ANGIOGRAPHY CHEST WITH CONTRAST TECHNIQUE: Multidetector CT imaging of the chest was performed using the standard protocol during bolus administration of intravenous contrast. Multiplanar CT image reconstructions and MIPs were obtained to evaluate the vascular anatomy. RADIATION DOSE REDUCTION: This exam was performed according to the departmental dose-optimization program which includes automated exposure control, adjustment of the mA and/or kV according to patient size and/or use of iterative reconstruction technique. CONTRAST:  75mL OMNIPAQUE IOHEXOL 350 MG/ML SOLN COMPARISON:  Chest radiograph 09/12/2023.  CT chest 03/14/2022 FINDINGS: Cardiovascular: Technically adequate study with good opacification of the central and segmental pulmonary arteries. Mild motion artifact. Focal filling defects are demonstrated in distal segmental branches to the left upper lung consistent with pulmonary emboli. Low clot burden. The RV to LV ratio is 0.8 suggesting no evidence of right heart strain. Minimal pericardial effusion. Normal caliber thoracic aorta. Calcification of  the aorta and coronary arteries. Mediastinum/Nodes: Subcentimeter left thyroid gland nodule. No imaging follow-up is indicated. Prominent lymph nodes in the right cardiophrenic angle measuring up to 1.1 cm short axis dimension. These are likely reactive but nonspecific. Esophagus is decompressed. Lungs/Pleura: Motion artifact limits evaluation. There is a large right pleural effusion with consolidation in the right lower lung and patchy infiltration throughout the right lung likely representing pneumonia. Compressive atelectasis could also have this appearance. There is focal narrowing of the right lower lobe bronchus which could be inflammatory but could also represent an endobronchial lesion. Suggest short-term follow-up to exclude underlying lesion. Could also consider bronchoscopy. Bronchial wall thickening with some fluid or secretions in the right lower lobe bronchi. This could indicate aspiration or mucous plugging. Three focal spiculated lesions in the left upper lung. Largest measures 1.1 x 1.7 cm. Series 6, image 28. The lesions appear similar to previous study. Stability since 2023 suggests benign lesion, likely scarring. Emphysematous changes in the lungs. Upper Abdomen: No acute process identified. Musculoskeletal: Degenerative changes in the spine. No focal bone lesions. Review of the MIP images confirms the above findings. IMPRESSION: 1. Positive examination for peripheral pulmonary emboli on the left. No evidence of right heart strain. 2. Large right pleural effusion with consolidation and infiltration in the right lung, possibly pneumonia or postobstructive change. 3. Right lower lung bronchial wall thickening, possibly inflammatory or neoplastic. Mucous plugging versus secretions or aspiration in the right lower lung bronchi. 4. Nodular spiculation in the left upper lung is unchanged since prior studies, likely areas of fibrosis related to treated lung cancer. 5. Emphysematous changes in the lungs.  6. Aortic atherosclerosis. 7. Prominent lymph nodes in the right cardiophrenic angle of nonspecific etiology. Critical Value/emergent results were called by telephone at the time of interpretation on 09/12/2023 at 10:34 pm to provider Dr. Particia Nearing, who verbally acknowledged these results. Electronically Signed   By: Burman Nieves M.D.   On: 09/12/2023 22:43   DG Chest Port 1 View Result Date: 09/12/2023 CLINICAL DATA:  Shortness of breath.  Chest pain. EXAM: PORTABLE CHEST 1 VIEW COMPARISON:  12/20/2022 FINDINGS: New finding of opacity in the right lung base likely representing a moderate-sized pleural effusion with basilar atelectasis or consolidation. This could represent compressive atelectasis or pneumonia. The left lung is clear. No pneumothorax. Mediastinal contours appear intact. Normal heart size. Calcification of the aorta. Degenerative changes in the shoulders. IMPRESSION: Moderate right pleural effusion with consolidation or atelectasis in the right lung base. Electronically Signed   By: Burman Nieves M.D.   On: 09/12/2023 19:32    Procedures Procedures    Medications Ordered in ED Medications  cefTRIAXone (ROCEPHIN) 1 g in sodium chloride 0.9 % 100 mL IVPB (has no administration in time range)  azithromycin (ZITHROMAX) 500 mg in sodium chloride 0.9 % 250 mL IVPB (has no administration in time range)  heparin ADULT infusion 100 units/mL (25000 units/228mL) (has no administration in time range)  heparin bolus via infusion 3,500 Units (has no administration in time range)  iohexol (OMNIPAQUE) 350 MG/ML injection 75 mL (75 mLs Intravenous Contrast Given 09/12/23 2222)    ED Course/ Medical Decision Making/ A&P                                 Medical Decision Making Amount and/or Complexity of Data Reviewed Labs: ordered. Radiology: ordered.   This patient presents to the ED with chief complaint(s) of chest pain and shortness of breath.  The complaint involves an extensive  differential diagnosis and also carries with it a high risk of complications and morbidity.   pertinent past medical history as listed in HPI  The differential diagnosis includes  ACS, PE, aortic dissection, pneumonia, pneumothorax, myocarditis, pericarditis, musculoskeletal, GERD, esophageal dissection, URI, bronchitis, COPD, asthma, CHF, peritonsillar abscess, anaphylaxis   The initial plan is to  Will start with basic labs, EKG, chest x-ray Additional history obtained: Additional history obtained from family Records reviewed Care Everywhere/External Records  Initial Assessment:   Patient presents hypertensive to 171/78 and hypoxic to 88% complaints of progressively worsening exertional chest pain and shortness of breath with productive cough for the past month.  On exam she has Rales on the right lower lobe without wheezing.  POCUS demonstrates pleural effusion on the right lower lobe.  She is breathing comfortably on 2 L.  Patient reports history of COPD, family questions this however.  She is not on oxygen at home.  She has no significant cardiac history or history of blood clots.  Does have a known history of lung cancer with resection possibly 20 years ago.  Chest CT from 2023 demonstrated emphysema with new spiculated nodules, no evidence of pleural effusion.  Independent ECG interpretation:  Normal sinus rhythm  Independent labs interpretation:  The following labs were independently interpreted:  CBC with leukocytosis of 13.1, respiratory panel negative, BMP within normal limits, troponin without significant elevation, D-dimer elevated to 9.06, CMP with mild elevation creatinine 1.08  Independent visualization and interpretation of imaging: I independently visualized the following imaging with scope of interpretation limited to determining acute life threatening conditions related to emergency care:  Chest x-ray, which revealed moderate right pleural effusion CT angio with evidence  of PE without right heart strain, large right pleural effusion with appears to be pneumonia, nodular spiculation unchanged, emphysematous changes  Treatment and Reassessment: Patient started on heparin for PE Started on Rocephin and azithromycin for CAP  Consultations obtained:   Hospitalist Dr. Gasper Sells agreed for admission  Disposition:   Patient will be admitted for PE and pneumonia  Social Determinants of Health:   none  This note was dictated with voice recognition software.  Despite best efforts at proofreading, errors may have occurred which can change the documentation meaning.          Final Clinical Impression(s) / ED Diagnoses Final diagnoses:  Other pulmonary embolism with acute cor pulmonale, unspecified chronicity (HCC)  Pleural effusion  Pneumonia due to infectious organism, unspecified laterality, unspecified part of lung    Rx / DC Orders ED Discharge Orders     None         Fabienne Bruns 09/12/23 2318    Jacalyn Lefevre, MD 09/12/23 2336

## 2023-09-12 NOTE — ED Triage Notes (Addendum)
 Pt c/o midsternal chest pain, SOB, productive cough w/clear/yellow mucousx20mo. Pt family states SOB w/exertion has gottenx1wk

## 2023-09-12 NOTE — H&P (Addendum)
 History and Physical    Patient: Pamela Terry XBJ:[id number] DOB: 1945/07/29 DOA: 09/12/2023 DOS: the patient was seen and examined on 09/12/2023 PCP: Corwin Levins, MD  Patient coming from: Home  Chief Complaint:  Chief Complaint  Patient presents with   Chest Pain   Cough   Shortness of Breath   HPI: Pamela Terry is a 78 y.o. female with medical history significant for dementia, adenocarcinoma of the right lung status post lobectomy in 2000, COPD, history of cervical cancer 1985, hypertension, anxiety and depression who presents with severe shortness of breath.  The patient is a poor historian and does not seem to recall details over the last couple of days.  She is able to tell me that she has been short of breath for about 2 days and it got much worse today.  Her son lives next door to her and she says she was not able to walk over to his house which usually she can do with no problem.  The ED reports that she was complaining of chest pain as well.  The patient did not recall the chest pain and is not able to give me any details about it. The ED triage note reports chest tightness, cough, shortness of breath and congestion for a month. Her O2 sats were 88% on room air.  Chart review reveals that the patient used to follow with pulmonology.  Her last visit was about 2 years ago.  At that time he was concerned for recurrence of her lung cancer and recommended more testing.  The patient did not follow-up after that. In the emergency department the patient required oxygen as she was hypoxic and tachypneic.  Her workup included a CTA which revealed a peripheral left PE and a right lower lobe pneumonia with a large right-sided pleural effusion and bronchial wall thickening.  She will be admitted to the hospitalist service on a heparin drip for the PE and antibiotics for the pneumonia and oxygen as needed.   Review of Systems: unable to review all systems due to the inability of the patient to  answer questions. Past Medical History:  Diagnosis Date   Acute asthmatic bronchitis    Adenocarcinoma, lung (HCC)    Allergic rhinitis    Anxiety    Carcinoma in situ of endocrine gland    Colon polyp    COPD (chronic obstructive pulmonary disease) (HCC)    Diverticulosis of colon    History of cervical cancer    Hypercholesterolemia    Microscopic hematuria    Osteoporosis    Past Surgical History:  Procedure Laterality Date   ABDOMINAL HYSTERECTOMY     in her 30's   APPENDECTOMY     TOTAL ABDOMINAL HYSTERECTOMY W/ BILATERAL SALPINGOOPHORECTOMY  1985   Dr Elana Alm for Ca of cervix   Social History:  reports that she quit smoking about 38 years ago. Her smoking use included cigarettes. She started smoking about 61 years ago. She has a 46 pack-year smoking history. She has never used smokeless tobacco. She reports that she does not drink alcohol and does not use drugs.  Allergies  Allergen Reactions   Augmentin [Amoxicillin-Pot Clavulanate]     Severe diarrhea   Sulfonamide Derivatives Hives and Rash    Family History  Problem Relation Age of Onset   Asthma Maternal Aunt    Heart disease Father    Hyperlipidemia Father    Stroke Father    Heart disease Paternal Grandfather  Heart disease Maternal Grandmother    Rheum arthritis Mother    Breast cancer Paternal Grandmother    Prostate cancer Maternal Grandfather    Diabetes Brother     Prior to Admission medications   Medication Sig Start Date End Date Taking? Authorizing Provider  acetaminophen (TYLENOL) 325 MG tablet Take 2 tablets (650 mg total) by mouth every 6 (six) hours as needed for moderate pain. 02/07/23   Wallis Bamberg, PA-C  alendronate (FOSAMAX) 70 MG tablet TAKE 1 TABLET BY MOUTH ONCE A WEEK. TAKE WITH A FULL GLASS OF WATER ON AN EMPTY STOMACH. 11/25/22   Corwin Levins, MD  amLODipine (NORVASC) 5 MG tablet TAKE 1 TABLET (5 MG TOTAL) BY MOUTH DAILY. 02/20/23   Corwin Levins, MD  Apoaequorin (PREVAGEN) 10 MG  CAPS Take 20 mg by mouth daily.    [provider]  aspirin 81 MG tablet Take 81 mg by mouth daily.    [provider]  atorvastatin (LIPITOR) 10 MG tablet TAKE 1 TABLET BY MOUTH EVERY DAY 06/26/23   Corwin Levins, MD  Calcium Carb-Cholecalciferol (CALTRATE BONE HEALTH PO) Take by mouth.    [provider]  Cholecalciferol (VITAMIN D) 2000 UNITS CAPS Take 1 capsule by mouth daily.    [provider]  citalopram (CELEXA) 10 MG tablet TAKE 1 TABLET BY MOUTH EVERY DAY 11/25/22   Corwin Levins, MD  famotidine (PEPCID) 20 MG tablet TAKE 1 TABLET BY MOUTH EVERY DAY 01/09/23   Corwin Levins, MD  fluconazole (DIFLUCAN) 150 MG tablet Take 1 tablet (150 mg total) by mouth once a week. 08/25/22   Wallis Bamberg, PA-C  fluticasone [facility]) 50 MCG/ACT nasal spray SPRAY 2 SPRAYS INTO EACH NOSTRIL EVERY DAY 08/20/20   Corwin Levins, MD  Fluticasone-Salmeterol 113-14 MCG/ACT AEPB TAKE 2 PUFFS BY MOUTH TWICE A DAY    [provider]  Fluticasone-Umeclidin-Vilant (TRELEGY ELLIPTA) 100-62.5-25 MCG/ACT AEPB Inhale 1 puff into the lungs daily. 04/14/23   Corwin Levins, MD  ketoconazole (NIZORAL) 2 % cream Apply 1 Application topically daily. 08/25/22   Wallis Bamberg, PA-C  LORazepam (ATIVAN) 1 MG tablet Take 1 tablet (1 mg total) by mouth at bedtime as needed. 12/12/20   Corwin Levins, MD  memantine [facility]) 10 MG tablet Take 1 tablet  10 mg 2 times a day 10/18/22   Marcos Eke, PA-C  montelukast (SINGULAIR) 10 MG tablet TAKE 1 TABLET BY MOUTH EVERY DAY 09/19/22   Corwin Levins, MD  pantoprazole (PROTONIX) 40 MG tablet Take 1 tablet (40 mg total) by mouth daily. 12/19/22   Corwin Levins, MD  predniSONE (DELTASONE) 10 MG tablet 2 tabs by mouth per day for 5 days 12/19/22   Corwin Levins, MD  QUEtiapine (SEROQUEL) 25 MG tablet TAKE 1 TABLET BY MOUTH EVERYDAY AT BEDTIME 11/25/22   Corwin Levins, MD  QUEtiapine (SEROQUEL) 50 MG tablet TAKE 1 TABLET BY MOUTH TWICE A DAY 04/25/23   Corwin Levins,  MD  vitamin B-12 (CYANOCOBALAMIN) 1000 MCG tablet Take 1 tablet (1,000 mcg total) by mouth daily. 07/18/19   Corwin Levins, MD    Physical Exam: Vitals:   09/12/23 1738 09/12/23 2045 09/12/23 2052 09/12/23 2100  BP: (!) 171/78 (!) 191/76  (!) 172/67  Pulse: 83  77 71  Resp: 16   17  Temp: 98.1 F (36.7 C)     SpO2: (!) 88%  98% 98%  Weight:  Height:       Physical Exam:  General: Tachypneic, well developed, well nourished HEENT: Normocephalic, atraumatic, PERRL Cardiovascular: Normal rate and rhythm. Distal pulses intact. Pulmonary: Tachypneic, trouble completing full sentences on 3 L O2 nasal cannula, diminished breath sounds but no wheezing Gastrointestinal: Nondistended abdomen, soft, non-tender, normoactive bowel sounds Musculoskeletal:Normal ROM, no lower ext edema Lymphadenopathy: No cervical LAD. Skin: Skin is warm and dry. Neuro: No focal deficits noted, sl agitated and forgetful PSYCH: Attentive and cooperative  Data Reviewed:  Results for orders placed or performed during the hospital encounter of 09/12/23 (from the past 24 hours)  Resp panel by RT-PCR (RSV, Flu A&B, Covid) Anterior Nasal Swab     Status: None   Collection Time: 09/12/23  5:40 PM   Specimen: Anterior Nasal Swab  Result Value Ref Range   SARS Coronavirus 2 by RT PCR NEGATIVE NEGATIVE   Influenza A by PCR NEGATIVE NEGATIVE   Influenza B by PCR NEGATIVE NEGATIVE   Resp Syncytial Virus by PCR NEGATIVE NEGATIVE  Comprehensive metabolic panel     Status: Abnormal   Collection Time: 09/12/23  5:40 PM  Result Value Ref Range   Sodium 140 135 - 145 mmol/L   Potassium 4.3 3.5 - 5.1 mmol/L   Chloride 106 98 - 111 mmol/L   CO2 23 22 - 32 mmol/L   Glucose, Bld 92 70 - 99 mg/dL   BUN 14 8 - 23 mg/dL   Creatinine, Ser 1.61 (H) 0.44 - 1.00 mg/dL   Calcium 9.0 8.9 - 09.6 mg/dL   Total Protein 7.2 6.5 - 8.1 g/dL   Albumin 3.4 (L) 3.5 - 5.0 g/dL   AST 29 15 - 41 U/L   ALT 22 0 - 44 U/L   Alkaline  Phosphatase 83 38 - 126 U/L   Total Bilirubin 0.9 0.0 - 1.2 mg/dL   GFR, Estimated 53 (L) >60 mL/min   Anion gap 11 5 - 15  CBC     Status: Abnormal   Collection Time: 09/12/23  5:40 PM  Result Value Ref Range   WBC 13.1 (H) 4.0 - 10.5 K/uL   RBC 5.48 (H) 3.87 - 5.11 MIL/uL   Hemoglobin 15.7 (H) 12.0 - 15.0 g/dL   HCT 04.5 (H) 40.9 - 81.1 %   MCV 90.0 80.0 - 100.0 fL   MCH 28.6 26.0 - 34.0 pg   MCHC 31.8 30.0 - 36.0 g/dL   RDW 91.4 78.2 - 95.6 %   Platelets 330 150 - 400 K/uL   nRBC 0.0 0.0 - 0.2 %  Troponin I (High Sensitivity)     Status: None   Collection Time: 09/12/23  5:40 PM  Result Value Ref Range   Troponin I (High Sensitivity) 10 <18 ng/L  D-dimer, quantitative     Status: Abnormal   Collection Time: 09/12/23  5:40 PM  Result Value Ref Range   D-Dimer, Quant 9.06 (H) 0.00 - 0.50 ug/mL-FEU  Brain natriuretic peptide     Status: None   Collection Time: 09/12/23  5:51 PM  Result Value Ref Range   B Natriuretic Peptide 27.7 0.0 - 100.0 pg/mL  Troponin I (High Sensitivity)     Status: None   Collection Time: 09/12/23  8:39 PM  Result Value Ref Range   Troponin I (High Sensitivity) 12 <18 ng/L     CTA chest IMPRESSION: 1. Positive examination for peripheral pulmonary emboli on the left. No evidence of right heart strain. 2. Large right pleural effusion with  consolidation and infiltration in the right lung, possibly pneumonia or postobstructive change. 3. Right lower lung bronchial wall thickening, possibly inflammatory or neoplastic. Mucous plugging versus secretions or aspiration in the right lower lung bronchi. 4. Nodular spiculation in the left upper lung is unchanged since prior studies, likely areas of fibrosis related to treated lung cancer. 5. Emphysematous changes in the lungs. 6. Aortic atherosclerosis. 7. Prominent lymph nodes in the right cardiophrenic angle of nonspecific etiology.   Assessment and Plan: Acute hypoxic respiratory failure Left  peripheral PE Right pleural effusion Right pneumonia  Abnormal CT w bronchial wall thickening  - IV Heparin drip - IV Rocephin and Zithromax - She is currently requiring 4 L O2 nasal cannula - Consider diagnostic thoracentesis - Consider pulmonary consult in a.m.    Advance Care Planning:   Code Status: Not on file the patient will be full code by default.  Not sure if she has capacity.  She names her son Deniece Portela as her Social research officer, government.  Consults: none  Family Communication: The patient's son was present at bedside for short period of time  Severity of Illness: The appropriate patient status for this patient is INPATIENT. Inpatient status is judged to be reasonable and necessary in order to provide the required intensity of service to ensure the patient's safety. The patient's presenting symptoms, physical exam findings, and initial radiographic and laboratory data in the context of their chronic comorbidities is felt to place them at high risk for further clinical deterioration. Furthermore, it is not anticipated that the patient will be medically stable for discharge from the hospital within 2 midnights of admission.   * I certify that at the point of admission it is my clinical judgment that the patient will require inpatient hospital care spanning beyond 2 midnights from the point of admission due to high intensity of service, high risk for further deterioration and high frequency of surveillance required.*  Author: Buena Irish, MD 09/12/2023 11:17 PM  For on call review [email or web address].

## 2023-09-13 ENCOUNTER — Inpatient Hospital Stay (HOSPITAL_COMMUNITY)

## 2023-09-13 DIAGNOSIS — R918 Other nonspecific abnormal finding of lung field: Secondary | ICD-10-CM

## 2023-09-13 DIAGNOSIS — J90 Pleural effusion, not elsewhere classified: Secondary | ICD-10-CM | POA: Diagnosis not present

## 2023-09-13 DIAGNOSIS — J189 Pneumonia, unspecified organism: Secondary | ICD-10-CM | POA: Diagnosis present

## 2023-09-13 DIAGNOSIS — I2699 Other pulmonary embolism without acute cor pulmonale: Secondary | ICD-10-CM | POA: Diagnosis not present

## 2023-09-13 LAB — HEPARIN LEVEL (UNFRACTIONATED)
Heparin Unfractionated: 1.07 [IU]/mL — ABNORMAL HIGH (ref 0.30–0.70)
Heparin Unfractionated: 0.33 [IU]/mL (ref 0.30–0.70)

## 2023-09-13 LAB — LACTATE DEHYDROGENASE, PLEURAL OR PERITONEAL FLUID: LD, Fluid: 217 U/L — ABNORMAL HIGH (ref 3–23)

## 2023-09-13 LAB — BODY FLUID CELL COUNT WITH DIFFERENTIAL
Eos, Fluid: 2 %
Lymphs, Fluid: 80 %
Monocyte-Macrophage-Serous Fluid: 15 % — ABNORMAL LOW (ref 50–90)
Neutrophil Count, Fluid: 3 % (ref 0–25)
Total Nucleated Cell Count, Fluid: 708 uL (ref 0–1000)

## 2023-09-13 LAB — PROTEIN, PLEURAL OR PERITONEAL FLUID: Total protein, fluid: 3.2 g/dL

## 2023-09-13 MED ORDER — UMECLIDINIUM BROMIDE 62.5 MCG/ACT IN AEPB
1.0000 | INHALATION_SPRAY | Freq: Every day | RESPIRATORY_TRACT | Status: DC
  Administered 2023-09-13 – 2023-09-18 (×6): 1 via RESPIRATORY_TRACT
  Filled 2023-09-13 (×2): qty 7

## 2023-09-13 MED ORDER — FAMOTIDINE 20 MG PO TABS
20.0000 mg | ORAL_TABLET | Freq: Every day | ORAL | Status: DC
  Administered 2023-09-13 – 2023-09-18 (×6): 20 mg via ORAL
  Filled 2023-09-13 (×6): qty 1

## 2023-09-13 MED ORDER — CITALOPRAM HYDROBROMIDE 20 MG PO TABS
10.0000 mg | ORAL_TABLET | Freq: Every day | ORAL | Status: DC
  Administered 2023-09-13 – 2023-09-18 (×6): 10 mg via ORAL
  Filled 2023-09-13 (×6): qty 1

## 2023-09-13 MED ORDER — SODIUM CHLORIDE 0.9 % IV SOLN
500.0000 mg | INTRAVENOUS | Status: DC
  Administered 2023-09-13: 500 mg via INTRAVENOUS
  Filled 2023-09-13 (×2): qty 5

## 2023-09-13 MED ORDER — SODIUM CHLORIDE 0.9 % IV SOLN
1.0000 g | INTRAVENOUS | Status: DC
  Administered 2023-09-13: 1 g via INTRAVENOUS
  Filled 2023-09-13: qty 10

## 2023-09-13 MED ORDER — FLUTICASONE FUROATE-VILANTEROL 100-25 MCG/ACT IN AEPB
1.0000 | INHALATION_SPRAY | Freq: Every day | RESPIRATORY_TRACT | Status: DC
  Administered 2023-09-13 – 2023-09-18 (×6): 1 via RESPIRATORY_TRACT
  Filled 2023-09-13 (×2): qty 28

## 2023-09-13 MED ORDER — MEMANTINE HCL 10 MG PO TABS
10.0000 mg | ORAL_TABLET | Freq: Two times a day (BID) | ORAL | Status: DC
  Administered 2023-09-13 – 2023-09-18 (×11): 10 mg via ORAL
  Filled 2023-09-13 (×12): qty 1

## 2023-09-13 MED ORDER — HEPARIN (PORCINE) 25000 UT/250ML-% IV SOLN
800.0000 [IU]/h | INTRAVENOUS | Status: DC
  Administered 2023-09-13: 750 [IU]/h via INTRAVENOUS
  Administered 2023-09-13: 800 [IU]/h via INTRAVENOUS
  Filled 2023-09-13: qty 250

## 2023-09-13 NOTE — Consult Note (Signed)
 NAME:  Pamela Terry, MRN:  [id number], DOB:  06-21-1946, LOS: 1 ADMISSION DATE:  09/12/2023, CONSULTATION DATE: 3/22 REFERRING MD: Allena Katz, CHIEF COMPLAINT: Lung mass and pleural effusion  History of Present Illness:  78 year old female patient previously followed by [place] in our clinic for abnormal CT of chest.  She has got a known history of prior adenocarcinoma of the right lung for which she had a lobectomy back in 2000, more recently in September 2023 CT imaging was showing bilateral pulmonary nodules with possibly the right being more prominent than the left.  The plan was initially to get follow-up CT imaging and possibly PET scan in December 2023, a follow-up chest x-ray was obtained in June 2024 showed a stable left upper lobe nodular opacity. Presented to the emergency room on 3/21 with chief complaint of shortness of breath.  Reported onset over the last couple days prior to admission had some chest tightness, Room air saturations 88% a CT of chest was obtained in the emergency room.  Imaging showed moderate right sided pleural effusion with associated atelectatic changes peripheral pulmonary emboli in the left without evidence of heart strain, and right basilar airspace disease with possible mucous plugging of the right lower bronchi also stable left upper lobe spiculated lung mass without significant change from previous imaging. She was admitted by the internal medicine service , Placed on empiric antibiotics using both ceftriaxone and azithromycin, and underwent diagnostic and therapeutic thoracentesis on 3/22 by interventional radiology.  She underwent successful right sided thoracentesis yielding 1000 L of amber-colored fluid, fluid had been sent for analysis, pulmonary asked to evaluate given concern for underlying/recurrent malignancy Pertinent  Medical History  Adenocarcinoma of the right lung with prior lobectomy 2000, COPD, history of cervical cancer in 85, hypertension, anxiety,  depression, dementia. Previously followed by Dr. Delton Coombes in our clinic, last office visit September 2023, showed mixed density left upper lobe nodules with more solid-appearing right sided nodules at time patient wanted to be conservative and there was a tentative plan for follow-up CT imaging/PET scan later that year Significant Hospital Events: Including procedures, antibiotic start and stop dates in addition to other pertinent events     Interim History / Subjective:  No distress  Objective   Blood pressure (!) 163/66, pulse 64, temperature 98.3 F (36.8 C), temperature source Oral, resp. rate 20, height 4\' 11"  (1.499 m), weight 63.5 kg, SpO2 95%.        Intake/Output Summary (Last 24 hours) at 09/13/2023 1137 Last data filed at 09/13/2023 0844 Gross per 24 hour  Intake 117.92 ml  Output --  Net 117.92 ml   Filed Weights   09/12/23 1737  Weight: 63.5 kg    Examination: General: resting comfortably no distress HENT: NCAT no JVD  Lungs: dec on right  Cardiovascular: rrr Abdomen: soft  Extremities: no edema  Neuro: awake. No focal def. Does have clear memory deficits and trouble w/ word finding    Resolved Hospital Problem list     Assessment & Plan:  H/o adenocarcinoma right lung w/ prior lobectomy Dyspnea  Right pleural effusion  RLL airspace disease possible PNA  RLL atx  LUL spiculated lung mass Small acute left PE Dementia   Discussion  This is a 78 year old female who is a poor historian. Husband provides some of hx d/t her dementia. ECOG score 3 presents w/ few days increased cough and SOB. Has known h/o of adenocarcinoma of right lung and has had bilateral pulm nodules of  concern since 2023. Now comes in w/ mod to large right pleural effusion w. Associated atelectasis as well as RLL airspace disease which could represent PNA. Also small left PE. She is s/p thoracentesis w/ improved aeration but still has sig Right sided volume loss. Pleural fluid exudative by  lights analysis. Ddx: CAP w/ parapneumonic effusion vs malignancy which in this would be advanced if spread to pleural   Plan Agree w/ current ABX Await pleural culture and cytology If cytology positive would need to talk to onc. With her dementia and ECOG 3 I am not sure she would be a good chemo candidate, and if effusion is malignant and recurrent her memory deficits could also make pleur-x challenging       Best Practice (right click and "Reselect all SmartList Selections" daily)  Per primary   Labs   CBC: Recent Labs  Lab 09/12/23 1740  WBC 13.1*  HGB 15.7*  HCT 49.3*  MCV 90.0  PLT 330    Basic Metabolic Panel: Recent Labs  Lab 09/12/23 1740  NA 140  K 4.3  CL 106  CO2 23  GLUCOSE 92  BUN 14  CREATININE 1.08*  CALCIUM 9.0   GFR: Estimated Creatinine Clearance: 35.9 mL/min (A) (by C-G formula based on SCr of 1.08 mg/dL (H)). Recent Labs  Lab 09/12/23 1740  WBC 13.1*    Liver Function Tests: Recent Labs  Lab 09/12/23 1740  AST 29  ALT 22  ALKPHOS 83  BILITOT 0.9  PROT 7.2  ALBUMIN 3.4*   No results for input(s): "LIPASE", "AMYLASE" in the last 168 hours. No results for input(s): "AMMONIA" in the last 168 hours.  ABG No results found for: "PHART", "PCO2ART", "PO2ART", "HCO3", "TCO2", "ACIDBASEDEF", "O2SAT"   Coagulation Profile: No results for input(s): "INR", "PROTIME" in the last 168 hours.  Cardiac Enzymes: No results for input(s): "CKTOTAL", "CKMB", "CKMBINDEX", "TROPONINI" in the last 168 hours.  HbA1C: Hgb A1c MFr Bld  Date/Time Value Ref Range Status  12/20/2022 08:28 AM 5.9 4.6 - 6.5 % Final    Comment:    Glycemic Control Guidelines for People with Diabetes:Non Diabetic:  <6%Goal of Therapy: <7%Additional Action Suggested:  >8%   09/03/2021 10:20 AM 6.0 4.6 - 6.5 % Final    Comment:    Glycemic Control Guidelines for People with Diabetes:Non Diabetic:  <6%Goal of Therapy: <7%Additional Action Suggested:  >8%     CBG: No  results for input(s): "GLUCAP" in the last 168 hours.  Review of Systems:   Not able given memory def   Past Medical History:  She,  has a past medical history of Acute asthmatic bronchitis, Adenocarcinoma, lung (HCC), Allergic rhinitis, Anxiety, Carcinoma in situ of endocrine gland, Colon polyp, COPD (chronic obstructive pulmonary disease) (HCC), Diverticulosis of colon, History of cervical cancer, Hypercholesterolemia, Microscopic hematuria, and Osteoporosis.   Surgical History:   Past Surgical History:  Procedure Laterality Date   ABDOMINAL HYSTERECTOMY     in her 15's   APPENDECTOMY     TOTAL ABDOMINAL HYSTERECTOMY W/ BILATERAL SALPINGOOPHORECTOMY  1985   Dr Elana Alm for Ca of cervix     Social History:   reports that she quit smoking about 38 years ago. Her smoking use included cigarettes. She started smoking about 61 years ago. She has a 46 pack-year smoking history. She has never used smokeless tobacco. She reports that she does not drink alcohol and does not use drugs.   Family History:  Her family history includes Asthma in her  maternal aunt; Breast cancer in her paternal grandmother; Diabetes in her brother; Heart disease in her father, maternal grandmother, and paternal grandfather; Hyperlipidemia in her father; Prostate cancer in her maternal grandfather; Rheum arthritis in her mother; Stroke in her father.   Allergies Allergies  Allergen Reactions   Augmentin [Amoxicillin-Pot Clavulanate]     Severe diarrhea   Sulfonamide Derivatives Hives and Rash     Home Medications  Prior to Admission medications   Medication Sig Start Date End Date Taking? Authorizing Provider  acetaminophen (TYLENOL) 325 MG tablet Take 2 tablets (650 mg total) by mouth every 6 (six) hours as needed for moderate pain. 02/07/23  Yes Wallis Bamberg, PA-C  alendronate (FOSAMAX) 70 MG tablet TAKE 1 TABLET BY MOUTH ONCE A WEEK. TAKE WITH A FULL GLASS OF WATER ON AN EMPTY STOMACH. 11/25/22  Yes Corwin Levins, MD  aspirin 81 MG chewable tablet Chew 81 mg by mouth daily.   Yes [provider]  atorvastatin (LIPITOR) 10 MG tablet TAKE 1 TABLET BY MOUTH EVERY DAY 06/26/23  Yes Corwin Levins, MD  Calcium Carb-Cholecalciferol (CALTRATE BONE HEALTH PO) Take by mouth.   Yes [provider]  Cholecalciferol (VITAMIN D) 2000 UNITS CAPS Take 1 capsule by mouth daily.   Yes [provider]  citalopram (CELEXA) 10 MG tablet TAKE 1 TABLET BY MOUTH EVERY DAY 11/25/22  Yes Corwin Levins, MD  famotidine (PEPCID) 20 MG tablet TAKE 1 TABLET BY MOUTH EVERY DAY 01/09/23  Yes Corwin Levins, MD  Fluticasone-Umeclidin-Vilant (TRELEGY ELLIPTA) 100-62.5-25 MCG/ACT AEPB Inhale 1 puff into the lungs daily. 04/14/23  Yes Corwin Levins, MD  memantine [facility]) 10 MG tablet Take 1 tablet  10 mg 2 times a day 10/18/22  Yes Wertman, Sung Amabile, PA-C  vitamin B-12 (CYANOCOBALAMIN) 1000 MCG tablet Take 1 tablet (1,000 mcg total) by mouth daily. 07/18/19  Yes Corwin Levins, MD     Critical care time: NA

## 2023-09-13 NOTE — Hospital Course (Signed)
 PMH of right lung adenocarcinoma, lost to follow-up, COPD, dementia, HTN, depression, cervical cancer. Presented to hospital with complaints of cough shortness of breath as well as right-sided chest pain. Workup showed small left PE, large right effusion as well as possible pneumonia with right endobronchial lesion. IR and pulmonology were consulted. Underwent 2 thoracentesis with 2 L total removed so far. Cytology positive for adenocarcinoma. Currently awaiting PT repeat eval. Assessment and Plan: Malignant pleural effusion on right  Presents with progressively worsening shortness of breath.  Symptoms actually ongoing for last few weeks. Chest x-ray and CT scan shows evidence of large pleural effusion.  Underwent thoracentesis x 2 with 1 L fluid removed each time. Cultures negative for any infection. Cytology came back positive for adenocarcinoma. Pulmonary consult appreciated.  Bacterial pneumonia. CT scan shows evidence of possible pneumonia. Presented with shortness of breath and cough. COVID-negative.  Currently on oral antibiotic.  History of adenocarcinoma of the right lung. In the past treated with lobectomy. Then lost to follow-up since 2023. At the time, was recommended PET scan outpatient but never followed up. Cytology of the pleural fluid is positive for adenocarcinoma. Pulmonary is aware.  Patient will require an outpatient PET scan as well as further workup. I have placed a referral to oncology as well.  Small acute left PE. Less likely cause of her hypoxia. Continue with supplemental oxygen as well as anticoagulation.  Based on family preference we will switch to Eliquis.  Dementia. Monitor.  At risk for delirium.  HLD. Continue statin.  Mood disorder. Continuing home regimen.  COPD. Currently no evidence of exacerbation.  Continue inhalers

## 2023-09-13 NOTE — Plan of Care (Signed)
   Problem: Education: Goal: Knowledge of General Education information will improve Description Including pain rating scale, medication(s)/side effects and non-pharmacologic comfort measures Outcome: Progressing

## 2023-09-13 NOTE — Progress Notes (Signed)
 ANTICOAGULATION CONSULT NOTE  Pharmacy Consult for Heparin Indication: pulmonary embolus  Allergies  Allergen Reactions   Augmentin [Amoxicillin-Pot Clavulanate]     Severe diarrhea   Sulfonamide Derivatives Hives and Rash    Patient Measurements: Height: 4\' 11"  (149.9 cm) Weight: 63.5 kg (139 lb 15.9 oz) IBW/kg (Calculated) : 43.2 Heparin Dosing Weight: 56.8 kg  Vital Signs: Temp: 98.3 F (36.8 C) (03/22 0654) Temp Source: Oral (03/22 0654) BP: 156/74 (03/22 0600) Pulse Rate: 57 (03/22 0600)  Labs: Recent Labs    09/12/23 1740 09/12/23 2039 09/13/23 0752  HGB 15.7*  --   --   HCT 49.3*  --   --   PLT 330  --   --   HEPARINUNFRC  --   --  1.07*  CREATININE 1.08*  --   --   TROPONINIHS 10 12  --     Estimated Creatinine Clearance: 35.9 mL/min (A) (by C-G formula based on SCr of 1.08 mg/dL (H)).   Medical History: Past Medical History:  Diagnosis Date   Acute asthmatic bronchitis    Adenocarcinoma, lung (HCC)    Allergic rhinitis    Anxiety    Carcinoma in situ of endocrine gland    Colon polyp    COPD (chronic obstructive pulmonary disease) (HCC)    Diverticulosis of colon    History of cervical cancer    Hypercholesterolemia    Microscopic hematuria    Osteoporosis     Medications:  (Not in a hospital admission)  Scheduled:   citalopram  10 mg Oral Daily   famotidine  20 mg Oral Daily   fluticasone furoate-vilanterol  1 puff Inhalation Daily   And   umeclidinium bromide  1 puff Inhalation Daily   memantine  10 mg Oral BID   Infusions:   azithromycin     cefTRIAXone (ROCEPHIN)  IV     heparin     PRN:   Assessment: 10 yof with a history of COPD, lung adenocarcinoma status resection . Patient is presenting with midsternal chest pain, SOB, productive cough . Heparin per pharmacy consult placed for pulmonary embolus.  CTA PE w/ peripheral pulmonary emboli on left w/out RHS  Patient is not on anticoagulation prior to arrival.  Hgb 15.7;  plt 330 D-Dimer 9.06  Initial heparin level after bolus and start of infusion is high at 1.07 for goal  - confirmed drawn from opposite arm. No bleeding or issues with infusion noted.   Goal of Therapy:  Heparin level 0.3-0.7 units/ml Monitor platelets by anticoagulation protocol: Yes   Plan:  Hold Heparin for ~30 minutes.  Restart Heparin at 9:30 AM at lower rate of 750 units/hr.  Check anti-Xa level in 8 hours and daily while on heparin Continue to monitor H&H and platelets  Link Snuffer, PharmD, BCPS, BCCCP Clinical Pharmacist Please refer to [facility] for [facility] numbers 09/13/2023 8:48 AM

## 2023-09-13 NOTE — Plan of Care (Signed)
  Problem: Clinical Measurements: Goal: Ability to maintain clinical measurements within normal limits will improve Outcome: Progressing   Problem: Clinical Measurements: Goal: Will remain free from infection Outcome: Progressing   Problem: Clinical Measurements: Goal: Diagnostic test results will improve Outcome: Progressing   Problem: Clinical Measurements: Goal: Respiratory complications will improve Outcome: Progressing   Problem: Activity: Goal: Risk for activity intolerance will decrease Outcome: Progressing   Problem: Pain Managment: Goal: General experience of comfort will improve and/or be controlled Outcome: Progressing   Problem: Safety: Goal: Ability to remain free from injury will improve Outcome: Progressing   Problem: Skin Integrity: Goal: Risk for impaired skin integrity will decrease Outcome: Progressing

## 2023-09-13 NOTE — Progress Notes (Signed)
 ANTICOAGULATION CONSULT NOTE  Pharmacy Consult for Heparin Indication: pulmonary embolus  Allergies  Allergen Reactions   Augmentin [Amoxicillin-Pot Clavulanate]     Severe diarrhea   Sulfonamide Derivatives Hives and Rash    Patient Measurements: Height: 4\' 11"  (149.9 cm) Weight: 63.5 kg (139 lb 15.9 oz) IBW/kg (Calculated) : 43.2 Heparin Dosing Weight: 56.8 kg  Vital Signs: Temp: 98.2 F (36.8 C) (03/22 1222) Temp Source: Oral (03/22 1222) BP: 154/106 (03/22 1302) Pulse Rate: 63 (03/22 1302)  Labs: Recent Labs    09/12/23 1740 09/12/23 2039 09/13/23 0752 09/13/23 1629  HGB 15.7*  --   --   --   HCT 49.3*  --   --   --   PLT 330  --   --   --   HEPARINUNFRC  --   --  1.07* 0.33  CREATININE 1.08*  --   --   --   TROPONINIHS 10 12  --   --     Estimated Creatinine Clearance: 35.9 mL/min (A) (by C-G formula based on SCr of 1.08 mg/dL (H)).   Medical History: Past Medical History:  Diagnosis Date   Acute asthmatic bronchitis    Adenocarcinoma, lung (HCC)    Allergic rhinitis    Anxiety    Carcinoma in situ of endocrine gland    Colon polyp    COPD (chronic obstructive pulmonary disease) (HCC)    Diverticulosis of colon    History of cervical cancer    Hypercholesterolemia    Microscopic hematuria    Osteoporosis     Medications:  Medications Prior to Admission  Medication Sig Dispense Refill Last Dose/Taking   acetaminophen (TYLENOL) 325 MG tablet Take 2 tablets (650 mg total) by mouth every 6 (six) hours as needed for moderate pain. 30 tablet 0 Unknown   alendronate (FOSAMAX) 70 MG tablet TAKE 1 TABLET BY MOUTH ONCE A WEEK. TAKE WITH A FULL GLASS OF WATER ON AN EMPTY STOMACH. 12 tablet 3 Past Week   aspirin 81 MG chewable tablet Chew 81 mg by mouth daily.   09/12/2023 Morning   atorvastatin (LIPITOR) 10 MG tablet TAKE 1 TABLET BY MOUTH EVERY DAY 90 tablet 3 09/12/2023 Morning   Calcium Carb-Cholecalciferol (CALTRATE BONE HEALTH PO) Take by mouth.    09/12/2023 Morning   Cholecalciferol (VITAMIN D) 2000 UNITS CAPS Take 1 capsule by mouth daily.   09/12/2023 Morning   citalopram (CELEXA) 10 MG tablet TAKE 1 TABLET BY MOUTH EVERY DAY 90 tablet 3 09/12/2023 Morning   famotidine (PEPCID) 20 MG tablet TAKE 1 TABLET BY MOUTH EVERY DAY 90 tablet 1 Unknown   Fluticasone-Umeclidin-Vilant (TRELEGY ELLIPTA) 100-62.5-25 MCG/ACT AEPB Inhale 1 puff into the lungs daily. 28 each 3 09/12/2023   memantine (NAMENDA) 10 MG tablet Take 1 tablet  10 mg 2 times a day 180 tablet 4 09/12/2023 Morning   vitamin B-12 (CYANOCOBALAMIN) 1000 MCG tablet Take 1 tablet (1,000 mcg total) by mouth daily. 90 tablet 1 09/12/2023 Morning   Scheduled:   citalopram  10 mg Oral Daily   famotidine  20 mg Oral Daily   fluticasone furoate-vilanterol  1 puff Inhalation Daily   And   umeclidinium bromide  1 puff Inhalation Daily   memantine  10 mg Oral BID   Infusions:   azithromycin     cefTRIAXone (ROCEPHIN)  IV     heparin 750 Units/hr (09/13/23 0935)   PRN:   Assessment: 12 yof with a history of COPD, lung adenocarcinoma status resection .  Patient is presenting with midsternal chest pain, SOB, productive cough . Heparin per pharmacy consult placed for pulmonary embolus.  CTA PE w/ peripheral pulmonary emboli on left w/out RHS  Patient is not on anticoagulation prior to arrival.  Hgb 15.7; plt 330 D-Dimer 9.06  HL came back therapeutic but on the lower side of goal range. We will increase slightly then check in AM.   Goal of Therapy:  Heparin level 0.3-0.7 units/ml Monitor platelets by anticoagulation protocol: Yes   Plan:  Increase heparin to 800 units/hr.  Check anti-Xa level in AM and daily while on heparin Continue to monitor H&H and platelets  Ulyses Southward, PharmD, BCIDP, AAHIVP, CPP Infectious Disease Pharmacist 09/13/2023 5:30 PM

## 2023-09-13 NOTE — Procedures (Signed)
 PROCEDURE SUMMARY:  Successful US guided right thoracentesis. Yielded 1.0 liters of amber fluid. Pt tolerated procedure well. No immediate complications.  Specimen was sent for labs. CXR ordered.  EBL < 5 mL  Hoyt Koch PA-C 09/13/2023 11:35 AM

## 2023-09-13 NOTE — Progress Notes (Signed)
 [facility] Progress Note Patient: Pamela Terry:[id number] DOB: [birth year] DOA: 09/12/2023  DOS: the patient was seen and examined on 09/13/2023  Brief Hospital Course: PMH of right lung adenocarcinoma, lost to follow-up, COPD, dementia, HTN, depression, cervical cancer. Presented to hospital with complaints of cough shortness of breath as well as right-sided chest pain. No nausea no vomiting. Workup showed small left PE, large right effusion as well as possible pneumonia with right endobronchial lesion.  Assessment and Plan: Right pleural effusion. Presents with progressively worsening shortness of breath.  Chest x-ray and CT scan shows evidence of large rectal effusion.  Underwent thoracentesis with 1 L fluid removed.  Analysis shows exudative pleural effusion with elevated LDH. Cytology and cultures currently pending. Pulmonary consulted appreciate consultation.  Community-acquired pneumonia. Presented with shortness of breath and cough. Currently being treated with IV antibiotic. Monitor for response. COVID-negative.  History of adenocarcinoma of the right lung. In the past treated with lobectomy. Then lost to follow-up since 2023. Was recommended PET scan outpatient but never followed up. Appreciate pulmonary consultation.  Small acute left PE. Less likely cause of her hypoxia. Continue with supplemental oxygen as well as anticoagulation.  Monitor.  Dementia. Currently noted to be on but this can present significant issue for the patient.  HLD. Continue statin.  Mood disorder. Continuing home regimen.  COPD. Currently no evidence of exacerbation.  Continue inhalers.     Subjective: No nausea no vomiting.  Seen before and after thoracentesis.  Significant improvement in shortness of breath.  Physical Exam: General: in Mild distress, No Rash Cardiovascular: S1 and S2 Present, No Murmur Respiratory: Good respiratory effort, Bilateral Air entry present.   Basal crackles, No wheezes Abdomen: Bowel Sound present, No tenderness Extremities: Trace edema Neuro: Alert and oriented x3, no new focal deficit  Data Reviewed: I have Reviewed nursing notes, Vitals, and Lab results. Since last encounter, pertinent lab results CBC and BMP   . I have ordered test including CBC and BMP  . I have discussed pt's care plan and test results with pulmonary  .   Disposition: Status is: Inpatient Remains inpatient appropriate because: Monitor for improvement in respiratory symptoms and further workup.   Family Communication: No one at bedside Level of care: Telemetry Medical   Vitals:   09/13/23 1130 09/13/23 1220 09/13/23 1222 09/13/23 1302  BP: (!) 128/113 (!) 150/65 (!) 150/65 (!) 154/106  Pulse:  61 63 63  Resp:  18 (!) 21   Temp:   98.2 F (36.8 C)   TempSrc:   Oral   SpO2:  98% 97% 96%  Weight:      Height:         Author: Lynden Oxford, MD 09/13/2023 5:39 PM  Please look on [email or web address] to find out who is on call.

## 2023-09-14 DIAGNOSIS — I2699 Other pulmonary embolism without acute cor pulmonale: Secondary | ICD-10-CM | POA: Diagnosis not present

## 2023-09-14 LAB — CBC
HCT: 41.4 % (ref 36.0–46.0)
Hemoglobin: 13.2 g/dL (ref 12.0–15.0)
MCH: 28.5 pg (ref 26.0–34.0)
MCHC: 31.9 g/dL (ref 30.0–36.0)
MCV: 89.4 fL (ref 80.0–100.0)
Platelets: 290 10*3/uL (ref 150–400)
RBC: 4.63 MIL/uL (ref 3.87–5.11)
RDW: 14.1 % (ref 11.5–15.5)
WBC: 11 10*3/uL — ABNORMAL HIGH (ref 4.0–10.5)
nRBC: 0 % (ref 0.0–0.2)

## 2023-09-14 LAB — HEPARIN LEVEL (UNFRACTIONATED)
Heparin Unfractionated: 0.48 [IU]/mL (ref 0.30–0.70)
Heparin Unfractionated: 0.43 [IU]/mL (ref 0.30–0.70)

## 2023-09-14 MED ORDER — ATORVASTATIN CALCIUM 10 MG PO TABS
10.0000 mg | ORAL_TABLET | Freq: Every day | ORAL | Status: DC
  Administered 2023-09-14 – 2023-09-18 (×5): 10 mg via ORAL
  Filled 2023-09-14 (×5): qty 1

## 2023-09-14 MED ORDER — HEPARIN (PORCINE) 25000 UT/250ML-% IV SOLN
800.0000 [IU]/h | INTRAVENOUS | Status: DC
  Administered 2023-09-15: 800 [IU]/h via INTRAVENOUS
  Filled 2023-09-14: qty 250

## 2023-09-14 MED ORDER — SODIUM CHLORIDE 0.9 % IV SOLN
2.0000 g | INTRAVENOUS | Status: DC
  Administered 2023-09-14 – 2023-09-15 (×2): 2 g via INTRAVENOUS
  Filled 2023-09-14 (×2): qty 20

## 2023-09-14 MED ORDER — ASPIRIN 81 MG PO TBEC
81.0000 mg | DELAYED_RELEASE_TABLET | Freq: Every day | ORAL | Status: DC
  Administered 2023-09-14: 81 mg via ORAL
  Filled 2023-09-14: qty 1

## 2023-09-14 MED ORDER — APIXABAN 5 MG PO TABS: 5.0000 mg | ORAL_TABLET | Freq: Two times a day (BID) | ORAL | Status: DC

## 2023-09-14 MED ORDER — ORAL CARE MOUTH RINSE: 15.0000 mL | OROMUCOSAL | Status: DC | PRN

## 2023-09-14 MED ORDER — APIXABAN 5 MG PO TABS
10.0000 mg | ORAL_TABLET | Freq: Two times a day (BID) | ORAL | Status: DC
  Filled 2023-09-14: qty 2

## 2023-09-14 MED ORDER — VITAMIN B-12 1000 MCG PO TABS
1000.0000 ug | ORAL_TABLET | Freq: Every day | ORAL | Status: DC
  Administered 2023-09-14 – 2023-09-18 (×5): 1000 ug via ORAL
  Filled 2023-09-14 (×5): qty 1

## 2023-09-14 NOTE — Plan of Care (Signed)
  Problem: Clinical Measurements: Goal: Will remain free from infection Outcome: Progressing   Problem: Clinical Measurements: Goal: Diagnostic test results will improve Outcome: Progressing   Problem: Clinical Measurements: Goal: Respiratory complications will improve Outcome: Progressing   Problem: Safety: Goal: Ability to remain free from injury will improve Outcome: Progressing   Problem: Skin Integrity: Goal: Risk for impaired skin integrity will decrease Outcome: Progressing   Problem: Pain Managment: Goal: General experience of comfort will improve and/or be controlled Outcome: Progressing

## 2023-09-14 NOTE — Progress Notes (Addendum)
 ANTICOAGULATION CONSULT NOTE  Pharmacy Consult for Heparin Indication: pulmonary embolus  Allergies  Allergen Reactions   Augmentin [Amoxicillin-Pot Clavulanate]     Severe diarrhea   Sulfonamide Derivatives Hives and Rash    Patient Measurements: Height: 4\' 11"  (149.9 cm) Weight: 63.5 kg (139 lb 15.9 oz) IBW/kg (Calculated) : 43.2 Heparin Dosing Weight: 56.8 kg  Vital Signs: Temp: 97.2 F (36.2 C) (03/23 0805) Temp Source: Oral (03/23 0428) BP: 142/95 (03/23 0805) Pulse Rate: 58 (03/23 0805)  Labs: Recent Labs    09/12/23 1740 09/12/23 2039 09/13/23 0752 09/13/23 1629 09/14/23 0616  HGB 15.7*  --   --   --  13.2  HCT 49.3*  --   --   --  41.4  PLT 330  --   --   --  290  HEPARINUNFRC  --   --  1.07* 0.33 0.48  CREATININE 1.08*  --   --   --   --   TROPONINIHS 10 12  --   --   --     Estimated Creatinine Clearance: 35.9 mL/min (A) (by C-G formula based on SCr of 1.08 mg/dL (H)).   Medical History: Past Medical History:  Diagnosis Date   Acute asthmatic bronchitis    Adenocarcinoma, lung (HCC)    Allergic rhinitis    Anxiety    Carcinoma in situ of endocrine gland    Colon polyp    COPD (chronic obstructive pulmonary disease) (HCC)    Diverticulosis of colon    History of cervical cancer    Hypercholesterolemia    Microscopic hematuria    Osteoporosis     Medications:  Medications Prior to Admission  Medication Sig Dispense Refill Last Dose/Taking   acetaminophen (TYLENOL) 325 MG tablet Take 2 tablets (650 mg total) by mouth every 6 (six) hours as needed for moderate pain. 30 tablet 0 Unknown   alendronate (FOSAMAX) 70 MG tablet TAKE 1 TABLET BY MOUTH ONCE A WEEK. TAKE WITH A FULL GLASS OF WATER ON AN EMPTY STOMACH. 12 tablet 3 Past Week   aspirin 81 MG chewable tablet Chew 81 mg by mouth daily.   09/12/2023 Morning   atorvastatin (LIPITOR) 10 MG tablet TAKE 1 TABLET BY MOUTH EVERY DAY 90 tablet 3 09/12/2023 Morning   Calcium Carb-Cholecalciferol  (CALTRATE BONE HEALTH PO) Take by mouth.   09/12/2023 Morning   Cholecalciferol (VITAMIN D) 2000 UNITS CAPS Take 1 capsule by mouth daily.   09/12/2023 Morning   citalopram (CELEXA) 10 MG tablet TAKE 1 TABLET BY MOUTH EVERY DAY 90 tablet 3 09/12/2023 Morning   famotidine (PEPCID) 20 MG tablet TAKE 1 TABLET BY MOUTH EVERY DAY 90 tablet 1 Unknown   Fluticasone-Umeclidin-Vilant (TRELEGY ELLIPTA) 100-62.5-25 MCG/ACT AEPB Inhale 1 puff into the lungs daily. 28 each 3 09/12/2023   memantine (NAMENDA) 10 MG tablet Take 1 tablet  10 mg 2 times a day 180 tablet 4 09/12/2023 Morning   vitamin B-12 (CYANOCOBALAMIN) 1000 MCG tablet Take 1 tablet (1,000 mcg total) by mouth daily. 90 tablet 1 09/12/2023 Morning   Scheduled:   citalopram  10 mg Oral Daily   famotidine  20 mg Oral Daily   fluticasone furoate-vilanterol  1 puff Inhalation Daily   And   umeclidinium bromide  1 puff Inhalation Daily   memantine  10 mg Oral BID   Infusions:   azithromycin 500 mg (09/13/23 2151)   cefTRIAXone (ROCEPHIN)  IV 1 g (09/13/23 2115)   heparin 800 Units/hr (09/13/23 2150)  PRN: mouth rinse  Assessment: 61 yof with a history of COPD, lung adenocarcinoma status resection . Patient is presenting with midsternal chest pain, SOB, productive cough . Heparin per pharmacy consult placed for pulmonary embolus.  CTA PE w/ peripheral pulmonary emboli on left w/out RHS  Patient is not on anticoagulation prior to arrival.  D/w Dr. Allena Katz, ok to change anticoagulation to apixaban but pulm wants to hold off for repeat thoracentesis. Heparin level has been therapeutic.  Goal of Therapy:  Monitor platelets by anticoagulation protocol: Yes   Plan:  Resume heparin at 800 units/hr Daily heparin level and cbc  Ulyses Southward, PharmD, BCIDP, AAHIVP, CPP Infectious Disease Pharmacist 09/14/2023 2:49 PM

## 2023-09-14 NOTE — Progress Notes (Signed)
 [facility] Progress Note Patient: Pamela Terry NFA:[id number] DOB: [birth year] DOA: 09/12/2023  DOS: the patient was seen and examined on 09/14/2023  Brief Hospital Course: PMH of right lung adenocarcinoma, lost to follow-up, COPD, dementia, HTN, depression, cervical cancer. Presented to hospital with complaints of cough shortness of breath as well as right-sided chest pain. No nausea no vomiting. Workup showed small left PE, large right effusion as well as possible pneumonia with right endobronchial lesion.  Assessment and Plan: Right pleural effusion. Presents with progressively worsening shortness of breath.  Chest x-ray and CT scan shows evidence of large rectal effusion.  Underwent thoracentesis with 1 L fluid removed.  Analysis shows exudative pleural effusion with elevated LDH. Cytology and cultures currently pending. Pulmonary consulted appreciate consultation. Most likely will require a repeat thoracentesis.  Will perform chest x-ray tomorrow and order repeat thoracentesis for tomorrow.  Continue heparin to facilitate thoracentesis.  Community-acquired pneumonia. Presented with shortness of breath and cough. Currently being treated with IV antibiotic.  Switch to oral if cultures are negative. Monitor for response. COVID-negative.  History of adenocarcinoma of the right lung. In the past treated with lobectomy. Then lost to follow-up since 2023. Was recommended PET scan outpatient but never followed up. Appreciate pulmonary consultation.  Small acute left PE. Less likely cause of her hypoxia. Continue with supplemental oxygen as well as anticoagulation.  Monitor.  Family would like to switch to Eliquis when ready.  Dementia. Currently noted to be on but this can present significant issue for the patient.  HLD. Continue statin.  Mood disorder. Continuing home regimen.  COPD. Currently no evidence of exacerbation.  Continue inhalers.     Subjective: No nausea  no vomiting no fever no chills.  Continues to have shortness of breath as well as some cough.  Physical Exam: General: in Mild distress, No Rash Cardiovascular: S1 and S2 Present, No Murmur Respiratory: Good respiratory effort, Bilateral Air entry present.  Diminished on right, faint on right crackles, No wheezes Abdomen: Bowel Sound present, No tenderness Extremities: No edema Neuro: Alert and oriented x3, no new focal deficit  Data Reviewed: I have Reviewed nursing notes, Vitals, and Lab results. Since last encounter, pertinent lab results CBC and BMP   . I have ordered test including CBC and BMP  . I have discussed pt's care plan and test results with pulmonary  . I have ordered imaging chest x-ray  .   Disposition: Status is: Inpatient Remains inpatient appropriate because: Need repeat thoracentesis   Family Communication: Family at bedside.  Discussed in detail with regards to plan of care. Level of care: Telemetry Medical   Vitals:   09/14/23 0805 09/14/23 0833 09/14/23 1716 09/14/23 1941  BP: (!) 142/95  (!) 146/65 (!) 146/57  Pulse: (!) 58  72 (!) 58  Resp:   18 17  Temp: (!) 97.2 F (36.2 C)  97.7 F (36.5 C) 98.1 F (36.7 C)  TempSrc:   Oral Oral  SpO2: 92% 93% 96% 95%  Weight:      Height:         Author: Lynden Oxford, MD 09/14/2023 7:47 PM  Please look on [email or web address] to find out who is on call.

## 2023-09-15 ENCOUNTER — Other Ambulatory Visit (HOSPITAL_COMMUNITY): Payer: Self-pay

## 2023-09-15 ENCOUNTER — Inpatient Hospital Stay (HOSPITAL_COMMUNITY)

## 2023-09-15 ENCOUNTER — Telehealth (HOSPITAL_COMMUNITY): Payer: Self-pay | Admitting: Pharmacy Technician

## 2023-09-15 DIAGNOSIS — I2699 Other pulmonary embolism without acute cor pulmonale: Secondary | ICD-10-CM | POA: Diagnosis not present

## 2023-09-15 LAB — BODY FLUID CELL COUNT WITH DIFFERENTIAL
Eos, Fluid: 3 %
Lymphs, Fluid: 70 %
Monocyte-Macrophage-Serous Fluid: 12 % — ABNORMAL LOW (ref 50–90)
Neutrophil Count, Fluid: 12 % (ref 0–25)
Other Cells, Fluid: 3 %
Total Nucleated Cell Count, Fluid: 779 uL (ref 0–1000)

## 2023-09-15 LAB — BASIC METABOLIC PANEL
Anion gap: 5 (ref 5–15)
BUN: 15 mg/dL (ref 8–23)
CO2: 25 mmol/L (ref 22–32)
Calcium: 8.1 mg/dL — ABNORMAL LOW (ref 8.9–10.3)
Chloride: 108 mmol/L (ref 98–111)
Creatinine, Ser: 0.95 mg/dL (ref 0.44–1.00)
GFR, Estimated: >60 mL/min (ref >60)
Glucose, Bld: 77 mg/dL (ref 70–99)
Potassium: 3.8 mmol/L (ref 3.5–5.1)
Sodium: 138 mmol/L (ref 135–145)

## 2023-09-15 LAB — CBC
HCT: 40.6 % (ref 36.0–46.0)
Hemoglobin: 13 g/dL (ref 12.0–15.0)
MCH: 28.6 pg (ref 26.0–34.0)
MCHC: 32 g/dL (ref 30.0–36.0)
MCV: 89.4 fL (ref 80.0–100.0)
Platelets: 289 10*3/uL (ref 150–400)
RBC: 4.54 MIL/uL (ref 3.87–5.11)
RDW: 14 % (ref 11.5–15.5)
WBC: 10.4 10*3/uL (ref 4.0–10.5)
nRBC: 0 % (ref 0.0–0.2)

## 2023-09-15 LAB — MAGNESIUM: Magnesium: 2.3 mg/dL (ref 1.7–2.4)

## 2023-09-15 LAB — HEPARIN LEVEL (UNFRACTIONATED): Heparin Unfractionated: 0.4 [IU]/mL (ref 0.30–0.70)

## 2023-09-15 MED ORDER — LIDOCAINE HCL 1 % IJ SOLN
10.0000 mL | Freq: Once | INTRAMUSCULAR | Status: AC
  Administered 2023-09-15: 8 mL via INTRADERMAL

## 2023-09-15 MED ORDER — LIDOCAINE HCL 1 % IJ SOLN
INTRAMUSCULAR | Status: AC
  Filled 2023-09-15: qty 20

## 2023-09-15 NOTE — Plan of Care (Signed)
  Problem: Health Behavior/Discharge Planning: Goal: Ability to manage health-related needs will improve Outcome: Progressing   Problem: Clinical Measurements: Goal: Will remain free from infection Outcome: Progressing Goal: Diagnostic test results will improve Outcome: Progressing   Problem: Nutrition: Goal: Adequate nutrition will be maintained Outcome: Progressing   Problem: Coping: Goal: Level of anxiety will decrease Outcome: Progressing   Problem: Elimination: Goal: Will not experience complications related to bowel motility Outcome: Progressing Goal: Will not experience complications related to urinary retention Outcome: Progressing

## 2023-09-15 NOTE — Telephone Encounter (Signed)
 Patient Product/process development scientist completed.    The patient is insured through [facility]. Patient has Medicare and is not eligible for a copay card, but may be able to apply for patient assistance or Medicare RX Payment Plan (Patient Must reach out to their plan, if eligible for payment plan), if available.    Ran test claim for Eliquis Starter Pack and the current 30 day co-pay is $467.00 due to a $420.00 deductible.  Will be $47.00 once deductible is met.   This test claim was processed through [facility]- copay amounts may vary at other pharmacies due to pharmacy/plan contracts, or as the patient moves through the different stages of their insurance plan.     Roland Earl, CPHT Pharmacy Technician III Certified Patient Advocate [facility] Patient Advocate Team Direct Number: [phone]  Fax: [phone]

## 2023-09-15 NOTE — Progress Notes (Addendum)
 NAME:  Pamela Terry, MRN:  [id number], DOB:  1946-05-14, LOS: 3 ADMISSION DATE:  09/12/2023, CONSULTATION DATE: 3/22 REFERRING MD: Allena Katz, CHIEF COMPLAINT: Lung mass and pleural effusion  History of Present Illness:  78 year old female patient previously followed by [facility] in our clinic for abnormal CT of chest.  She has got a known history of prior adenocarcinoma of the right lung for which she had a lobectomy back in 2000, more recently in September 2023 CT imaging was showing bilateral pulmonary nodules with possibly the right being more prominent than the left.  The plan was initially to get follow-up CT imaging and possibly PET scan in December 2023, a follow-up chest x-ray was obtained in June 2024 showed a stable left upper lobe nodular opacity. Presented to the emergency room on 3/21 with chief complaint of shortness of breath.  Reported onset over the last couple days prior to admission had some chest tightness, Room air saturations 88% a CT of chest was obtained in the emergency room.  Imaging showed moderate right sided pleural effusion with associated atelectatic changes peripheral pulmonary emboli in the left without evidence of heart strain, and right basilar airspace disease with possible mucous plugging of the right lower bronchi also stable left upper lobe spiculated lung mass without significant change from previous imaging. She was admitted by the internal medicine service , Placed on empiric antibiotics using both ceftriaxone and azithromycin, and underwent diagnostic and therapeutic thoracentesis on 3/22 by interventional radiology.  She underwent successful right sided thoracentesis yielding 1000 L of amber-colored fluid, fluid had been sent for analysis, pulmonary asked to evaluate given concern for underlying/recurrent malignancy.  Pertinent  Medical History  Adenocarcinoma of the right lung with prior lobectomy 2000, COPD, history of cervical cancer in 86, hypertension, anxiety,  depression, dementia. Previously followed by Dr. Delton Coombes in our clinic, last office visit September 2023, showed mixed density left upper lobe nodules with more solid-appearing right sided nodules at time patient wanted to be conservative and there was a tentative plan for follow-up CT imaging/PET scan later that year Significant Hospital Events: Including procedures, antibiotic start and stop dates in addition to other pertinent events   Admission 3/21 PCCM consult 3/22 3/22 Right Thoracentesis yielded 1 liter amber fluid>> cytology in process on 3/24 3/24 Right Thoracentesis yielded 1 Liter  red fluid>> cytology in process  Interim History / Subjective:  No distress Remains on 3 L [place] States her breathing is better since procedure this morning.  Repeat right  thoracentesis 3/24 for another 100 cc's of pleural fluid.   Objective   Blood pressure (!) 143/43, pulse (!) 54, temperature 97.7 F (36.5 C), resp. rate 18, height 4\' 11"  (1.499 m), weight 63.5 kg, SpO2 91%.        Intake/Output Summary (Last 24 hours) at 09/15/2023 1301 Last data filed at 09/15/2023 0748 Gross per 24 hour  Intake 439.1 ml  Output 300 ml  Net 139.1 ml   Filed Weights   09/12/23 1737  Weight: 63.5 kg    Examination: General: resting comfortably no distress, on 3 L HENT: NCAT no JVD , MM pink and dry, No LAD Lungs: Bilateral chest excursion, slightly diminished per right side.   Cardiovascular: rrr, No RMG Abdomen: soft , NT, BS +, Body mass index is 28.27 kg/m.  Extremities: no edema , no obvious deformities Neuro: awake. No focal def. Does have clear memory deficits and trouble w/ word finding , but is able to follow commands and comment  on her breathing status.    Resolved Hospital Problem list     Assessment & Plan:  H/o adenocarcinoma right lung w/ prior lobectomy Dyspnea  Right pleural effusion  RLL airspace disease possible PNA  RLL atx  LUL spiculated lung mass Small acute left  PE Dementia   Discussion  This is a 78 year old female who is a poor historian. Husband provides some of hx d/t her dementia. ECOG score 3 on admission ,  presents w/ few days increased cough and SOB. Has known h/o of adenocarcinoma of right lung and has had bilateral pulm nodules of concern since 2023. Now comes in w/ mod to large right pleural effusion w. Associated atelectasis as well as RLL airspace disease which could represent PNA. Also small left PE. She is s/p thoracentesis on 3/22 and 3/24   w/ improved aeration . Pleural fluid exudative by lights analysis. Ddx: CAP w/ parapneumonic effusion vs malignancy which in this would be advanced if spread to pleural   Plan Continue  current ABX Follow Pleural culture and cytology from 3/22 >> still pending 3/24 If cytology positive would need to talk to onc.  With her dementia and ECOG 3  she may not  be a good chemo candidate, and if effusion is malignant and recurrent her memory deficits could also make pleur-x management challenging . Wean oxygen as able for sats of > 90% Follow chest imaging as needed.   PCCM will follow up 3/25 to see if cytology has resulted.   Bevelyn Ngo, MSN, AGACNP-BC [facility] See Amion for personal pager PCCM on call pager [phone]  09/15/2023 1:40 PM    Best Practice (right click and "Reselect all SmartList Selections" daily)  Per primary   Labs   CBC: Recent Labs  Lab 09/12/23 1740 09/14/23 0616 09/15/23 0409  WBC 13.1* 11.0* 10.4  HGB 15.7* 13.2 13.0  HCT 49.3* 41.4 40.6  MCV 90.0 89.4 89.4  PLT 330 290 289    Basic Metabolic Panel: Recent Labs  Lab 09/12/23 1740 09/15/23 0409  NA 140 138  K 4.3 3.8  CL 106 108  CO2 23 25  GLUCOSE 92 77  BUN 14 15  CREATININE 1.08* 0.95  CALCIUM 9.0 8.1*  MG  --  2.3   GFR: Estimated Creatinine Clearance: 40.8 mL/min (by C-G formula based on SCr of 0.95 mg/dL). Recent Labs  Lab 09/12/23 1740  09/14/23 0616 09/15/23 0409  WBC 13.1* 11.0* 10.4    Liver Function Tests: Recent Labs  Lab 09/12/23 1740  AST 29  ALT 22  ALKPHOS 83  BILITOT 0.9  PROT 7.2  ALBUMIN 3.4*   No results for input(s): "LIPASE", "AMYLASE" in the last 168 hours. No results for input(s): "AMMONIA" in the last 168 hours.  ABG No results found for: "PHART", "PCO2ART", "PO2ART", "HCO3", "TCO2", "ACIDBASEDEF", "O2SAT"   Coagulation Profile: No results for input(s): "INR", "PROTIME" in the last 168 hours.  Cardiac Enzymes: No results for input(s): "CKTOTAL", "CKMB", "CKMBINDEX", "TROPONINI" in the last 168 hours.  HbA1C: Hgb A1c MFr Bld  Date/Time Value Ref Range Status  12/20/2022 08:28 AM 5.9 4.6 - 6.5 % Final    Comment:    Glycemic Control Guidelines for People with Diabetes:Non Diabetic:  <6%Goal of Therapy: <7%Additional Action Suggested:  >8%   09/03/2021 10:20 AM 6.0 4.6 - 6.5 % Final    Comment:    Glycemic Control Guidelines for People with Diabetes:Non Diabetic:  <6%Goal of  Therapy: <7%Additional Action Suggested:  >8%     CBG: No results for input(s): "GLUCAP" in the last 168 hours.  Review of Systems:   Not able given memory def   Past Medical History:  She,  has a past medical history of Acute asthmatic bronchitis, Adenocarcinoma, lung (HCC), Allergic rhinitis, Anxiety, Carcinoma in situ of endocrine gland, Colon polyp, COPD (chronic obstructive pulmonary disease) (HCC), Diverticulosis of colon, History of cervical cancer, Hypercholesterolemia, Microscopic hematuria, and Osteoporosis.   Surgical History:   Past Surgical History:  Procedure Laterality Date   ABDOMINAL HYSTERECTOMY     in her 89's   APPENDECTOMY     TOTAL ABDOMINAL HYSTERECTOMY W/ BILATERAL SALPINGOOPHORECTOMY  1985   Dr Elana Alm for Ca of cervix     Social History:   reports that she quit smoking about 38 years ago. Her smoking use included cigarettes. She started smoking about 61 years ago. She has a 46  pack-year smoking history. She has never used smokeless tobacco. She reports that she does not drink alcohol and does not use drugs.   Family History:  Her family history includes Asthma in her maternal aunt; Breast cancer in her paternal grandmother; Diabetes in her brother; Heart disease in her father, maternal grandmother, and paternal grandfather; Hyperlipidemia in her father; Prostate cancer in her maternal grandfather; Rheum arthritis in her mother; Stroke in her father.   Allergies Allergies  Allergen Reactions   Augmentin [Amoxicillin-Pot Clavulanate]     Severe diarrhea   Sulfonamide Derivatives Hives and Rash     Home Medications  Prior to Admission medications   Medication Sig Start Date End Date Taking? Authorizing Provider  acetaminophen (TYLENOL) 325 MG tablet Take 2 tablets (650 mg total) by mouth every 6 (six) hours as needed for moderate pain. 02/07/23  Yes Wallis Bamberg, PA-C  alendronate (FOSAMAX) 70 MG tablet TAKE 1 TABLET BY MOUTH ONCE A WEEK. TAKE WITH A FULL GLASS OF WATER ON AN EMPTY STOMACH. 11/25/22  Yes Corwin Levins, MD  aspirin 81 MG chewable tablet Chew 81 mg by mouth daily.   Yes [provider]  atorvastatin (LIPITOR) 10 MG tablet TAKE 1 TABLET BY MOUTH EVERY DAY 06/26/23  Yes Corwin Levins, MD  Calcium Carb-Cholecalciferol (CALTRATE BONE HEALTH PO) Take by mouth.   Yes [provider]  Cholecalciferol (VITAMIN D) 2000 UNITS CAPS Take 1 capsule by mouth daily.   Yes [provider]  citalopram (CELEXA) 10 MG tablet TAKE 1 TABLET BY MOUTH EVERY DAY 11/25/22  Yes Corwin Levins, MD  famotidine (PEPCID) 20 MG tablet TAKE 1 TABLET BY MOUTH EVERY DAY 01/09/23  Yes Corwin Levins, MD  Fluticasone-Umeclidin-Vilant (TRELEGY ELLIPTA) 100-62.5-25 MCG/ACT AEPB Inhale 1 puff into the lungs daily. 04/14/23  Yes Corwin Levins, MD  memantine [facility]) 10 MG tablet Take 1 tablet  10 mg 2 times a day 10/18/22  Yes Wertman, Sung Amabile, PA-C  vitamin B-12  (CYANOCOBALAMIN) 1000 MCG tablet Take 1 tablet (1,000 mcg total) by mouth daily. 07/18/19  Yes Corwin Levins, MD     Pulmonary APP Time ; 20 MINUTES

## 2023-09-15 NOTE — Progress Notes (Signed)
 [facility] Progress Note Patient: Pamela Terry ZOX:[id number] DOB: Aug 26, 1945 DOA: 09/12/2023  DOS: the patient was seen and examined on 09/15/2023  Brief Hospital Course: PMH of right lung adenocarcinoma, lost to follow-up, COPD, dementia, HTN, depression, cervical cancer. Presented to hospital with complaints of cough shortness of breath as well as right-sided chest pain. No nausea no vomiting. Workup showed small left PE, large right effusion as well as possible pneumonia with right endobronchial lesion.  Assessment and Plan: Right pleural effusion.  Likely malignant. Presents with progressively worsening shortness of breath.  Chest x-ray and CT scan shows evidence of large rectal effusion.  Underwent thoracentesis with 1 L fluid removed.  Analysis shows exudative pleural effusion with elevated LDH. First pleural fluid sample was amber in color second pleural fluid sample was bloody in color. Cytology and cultures currently pending. Pulmonary consulted appreciate consultation. Continue heparin for now given bloody nature with a repeat thoracentesis.  Follow-up on chest x-ray tomorrow.  Bacterial pneumonia. Presented with shortness of breath and cough. Switch to oral antibiotic. COVID-negative.  History of adenocarcinoma of the right lung. In the past treated with lobectomy. Then lost to follow-up since 2023. Was recommended PET scan outpatient but never followed up. Appreciate pulmonary consultation.  Small acute left PE. Less likely cause of her hypoxia. Continue with supplemental oxygen as well as anticoagulation.  Monitor.  Family would like to switch to Eliquis when ready.  Dementia. Monitor.  HLD. Continue statin.  Mood disorder. Continuing home regimen.  COPD. Currently no evidence of exacerbation.  Continue inhalers   Subjective: No nausea no vomiting no fever no chills no chest pain.  Physical Exam: General: in Mild distress, No Rash Cardiovascular:  S1 and S2 Present, No Murmur Respiratory: Good respiratory effort, Bilateral Air entry present.  Improving breath sound bilaterally, minimal right-sided crackles, No wheezes Abdomen: Bowel Sound present, No tenderness Extremities: No edema Neuro: Alert and oriented x3, no new focal deficit  Data Reviewed: I have Reviewed nursing notes, Vitals, and Lab results. Since last encounter, pertinent lab results CBC and BMP   . I have ordered test including CBC and BMP  . I have ordered imaging chest x-ray  .   Disposition: Status is: Inpatient Remains inpatient appropriate because: Monitor for improvement in respiratory status   Family Communication: No one at bedside.  Discussed with daughter-in-law on 3/23.  Will call again on 3/25. Level of care: Telemetry Medical   Vitals:   09/15/23 0825 09/15/23 0831 09/15/23 0855 09/15/23 1639  BP: (!) 143/43   (!) 143/43  Pulse: (!) 54   (!) 56  Resp:    16  Temp: 97.7 F (36.5 C)   98 F (36.7 C)  TempSrc:    Oral  SpO2: 95% 95% 91% 97%  Weight:      Height:         Author: Lynden Oxford, MD 09/15/2023 5:54 PM  Please look on [email or web address] to find out who is on call.

## 2023-09-15 NOTE — Care Management Important Message (Signed)
 Important Message  Patient Details  Name: Pamela Terry MRN: [id number] Date of Birth: [birth year]   Important Message Given:  Yes - Medicare IM     Dorena Bodo 09/15/2023, 2:31 PM

## 2023-09-15 NOTE — Plan of Care (Signed)
   Problem: Education: Goal: Knowledge of General Education information will improve Description: Including pain rating scale, medication(s)/side effects and non-pharmacologic comfort measures Outcome: Completed/Met

## 2023-09-15 NOTE — Progress Notes (Signed)
 ANTICOAGULATION CONSULT NOTE  Pharmacy Consult for Heparin Indication: pulmonary embolus  Allergies  Allergen Reactions   Augmentin [Amoxicillin-Pot Clavulanate]     Severe diarrhea   Sulfonamide Derivatives Hives and Rash    Patient Measurements: Height: 4\' 11"  (149.9 cm) Weight: 63.5 kg (139 lb 15.9 oz) IBW/kg (Calculated) : 43.2 Heparin Dosing Weight: 56.8 kg  Vital Signs: Temp: 97.8 F (36.6 C) (03/24 0419) Temp Source: Oral (03/24 0419) BP: 147/94 (03/24 0419) Pulse Rate: 63 (03/24 0419)  Labs: Recent Labs    09/12/23 1740 09/12/23 2039 09/13/23 0752 09/14/23 0616 09/14/23 1410 09/15/23 0409  HGB 15.7*  --   --  13.2  --  13.0  HCT 49.3*  --   --  41.4  --  40.6  PLT 330  --   --  290  --  289  HEPARINUNFRC  --   --    < > 0.48 0.43 0.40  CREATININE 1.08*  --   --   --   --  0.95  TROPONINIHS 10 12  --   --   --   --    < > = values in this interval not displayed.    Estimated Creatinine Clearance: 40.8 mL/min (by C-G formula based on SCr of 0.95 mg/dL).   Medical History: Past Medical History:  Diagnosis Date   Acute asthmatic bronchitis    Adenocarcinoma, lung (HCC)    Allergic rhinitis    Anxiety    Carcinoma in situ of endocrine gland    Colon polyp    COPD (chronic obstructive pulmonary disease) (HCC)    Diverticulosis of colon    History of cervical cancer    Hypercholesterolemia    Microscopic hematuria    Osteoporosis     Medications:  Medications Prior to Admission  Medication Sig Dispense Refill Last Dose/Taking   acetaminophen (TYLENOL) 325 MG tablet Take 2 tablets (650 mg total) by mouth every 6 (six) hours as needed for moderate pain. 30 tablet 0 Unknown   alendronate (FOSAMAX) 70 MG tablet TAKE 1 TABLET BY MOUTH ONCE A WEEK. TAKE WITH A FULL GLASS OF WATER ON AN EMPTY STOMACH. 12 tablet 3 Past Week   aspirin 81 MG chewable tablet Chew 81 mg by mouth daily.   09/12/2023 Morning   atorvastatin (LIPITOR) 10 MG tablet TAKE 1 TABLET BY  MOUTH EVERY DAY 90 tablet 3 09/12/2023 Morning   Calcium Carb-Cholecalciferol (CALTRATE BONE HEALTH PO) Take by mouth.   09/12/2023 Morning   Cholecalciferol (VITAMIN D) 2000 UNITS CAPS Take 1 capsule by mouth daily.   09/12/2023 Morning   citalopram (CELEXA) 10 MG tablet TAKE 1 TABLET BY MOUTH EVERY DAY 90 tablet 3 09/12/2023 Morning   famotidine (PEPCID) 20 MG tablet TAKE 1 TABLET BY MOUTH EVERY DAY 90 tablet 1 Unknown   Fluticasone-Umeclidin-Vilant (TRELEGY ELLIPTA) 100-62.5-25 MCG/ACT AEPB Inhale 1 puff into the lungs daily. 28 each 3 09/12/2023   memantine (NAMENDA) 10 MG tablet Take 1 tablet  10 mg 2 times a day 180 tablet 4 09/12/2023 Morning   vitamin B-12 (CYANOCOBALAMIN) 1000 MCG tablet Take 1 tablet (1,000 mcg total) by mouth daily. 90 tablet 1 09/12/2023 Morning   Scheduled:   atorvastatin  10 mg Oral Daily   citalopram  10 mg Oral Daily   cyanocobalamin  1,000 mcg Oral Daily   famotidine  20 mg Oral Daily   fluticasone furoate-vilanterol  1 puff Inhalation Daily   And   umeclidinium bromide  1 puff Inhalation  Daily   memantine  10 mg Oral BID   Infusions:   cefTRIAXone (ROCEPHIN)  IV 2 g (09/14/23 2117)   heparin 800 Units/hr (09/15/23 0423)   PRN: mouth rinse  Assessment: 77 yof with a history of COPD, lung adenocarcinoma status resection . Patient is presenting with midsternal chest pain, SOB, productive cough . Heparin per pharmacy consult placed for pulmonary embolus.  CTA PE w/ peripheral pulmonary emboli on left w/out RHS  Patient is not on anticoagulation prior to arrival.  D/w Dr. Allena Katz, ok to change anticoagulation to apixaban but pulm wants to hold off for repeat thoracentesis.   3/24 AM: Heparin level therapeutic at 0.40 with heparin running at 800 units/hour. CBC WNL. No signs of bleeding or issues with the heparin infusion noted.    Goal of Therapy:  Monitor platelets by anticoagulation protocol: Yes   Plan:  Continue heparin at 800 units/hr Daily heparin  level and cbc F/U [facility] plans   Jani Gravel, PharmD Clinical Pharmacist  09/15/2023 8:09 AM

## 2023-09-15 NOTE — TOC CM/SW Note (Addendum)
 Transition of Care [facility]) - Inpatient Brief Assessment   Patient Details  Name: Pamela Terry MRN: [id number] Date of Birth: 1945/09/23  [facility] of [facility]) CM/SW Contact:    Tom-Johnson, Hershal Coria, RN Phone Number: 09/15/2023, 11:20 AM   Clinical Narrative:  Patient presented to the ED with worsening Mid-Sternal Chest pain, Shortness Of Breath and productive Cough.  Found to have Pulmonary Embolism, started on Heparin gtt and Pleural Effusion. Underwent Thoracentesis with 1 L fluid removed on 09/13/23 and a repeat today with 1 L of clear Red fluid removed by IR.  On 3L O2 acute, does not use home O2. On IV abx. Pharmacy following for Heparin gtt and bridging to Eliquis.   Patient has hx of Dementia, Adenocarcinoma of the Rt Lung s/p Lobectomy in 2000, COPD, Cervical Cancer 1985, HTN, Anxiety and Depression.    From home with husband whom is at bedside with patient. Has one supportive son. Does not drive, husband transports.  Has a walker, bsc, shower seat, w/c and grab bars at home.  PCP is   Corwin Levins, MD and uses [facility] on [address] in [place].  CM will continue to follow for discharge needs as patient might need home O2 at discharge.         Transition of Care Asessment: Insurance and Status: Insurance coverage has been reviewed Patient has primary care physician: Yes Home environment has been reviewed: Yes Prior level of function:: Modified Independent Prior/Current Home Services: No current home services Social Drivers of Health Review: SDOH reviewed no interventions necessary Readmission risk has been reviewed: Yes Transition of care needs: transition of care needs identified, TOC will continue to follow (Possible home O2.)

## 2023-09-15 NOTE — Procedures (Signed)
 PROCEDURE SUMMARY:  Successful US guided right thoracentesis. Yielded 1 liter of clear red fluid. Patient tolerated procedure well. No immediate complications. EBL = trace  Specimen was sent for labs.  Post procedure chest X-ray reveals no pneumothorax  Loman Brooklyn PA-C 09/15/2023 9:37 AM

## 2023-09-16 DIAGNOSIS — I2609 Other pulmonary embolism with acute cor pulmonale: Secondary | ICD-10-CM

## 2023-09-16 LAB — CBC
HCT: 41 % (ref 36.0–46.0)
Hemoglobin: 13.1 g/dL (ref 12.0–15.0)
MCH: 28.5 pg (ref 26.0–34.0)
MCHC: 32 g/dL (ref 30.0–36.0)
MCV: 89.3 fL (ref 80.0–100.0)
Platelets: 276 10*3/uL (ref 150–400)
RBC: 4.59 MIL/uL (ref 3.87–5.11)
RDW: 14 % (ref 11.5–15.5)
WBC: 9.4 10*3/uL (ref 4.0–10.5)
nRBC: 0 % (ref 0.0–0.2)

## 2023-09-16 LAB — CYTOLOGY - NON PAP

## 2023-09-16 LAB — BASIC METABOLIC PANEL
Anion gap: 7 (ref 5–15)
BUN: 13 mg/dL (ref 8–23)
CO2: 26 mmol/L (ref 22–32)
Calcium: 8.4 mg/dL — ABNORMAL LOW (ref 8.9–10.3)
Chloride: 108 mmol/L (ref 98–111)
Creatinine, Ser: 0.94 mg/dL (ref 0.44–1.00)
GFR, Estimated: >60 mL/min (ref >60)
Glucose, Bld: 84 mg/dL (ref 70–99)
Potassium: 3.9 mmol/L (ref 3.5–5.1)
Sodium: 141 mmol/L (ref 135–145)

## 2023-09-16 LAB — BODY FLUID CULTURE W GRAM STAIN

## 2023-09-16 LAB — HEPARIN LEVEL (UNFRACTIONATED): Heparin Unfractionated: 0.37 [IU]/mL (ref 0.30–0.70)

## 2023-09-16 LAB — MAGNESIUM: Magnesium: 2.1 mg/dL (ref 1.7–2.4)

## 2023-09-16 MED ORDER — APIXABAN 5 MG PO TABS: 5.0000 mg | ORAL_TABLET | Freq: Two times a day (BID) | ORAL | Status: DC

## 2023-09-16 MED ORDER — ACETAMINOPHEN 500 MG PO TABS
1000.0000 mg | ORAL_TABLET | Freq: Three times a day (TID) | ORAL | Status: DC
  Administered 2023-09-16 – 2023-09-17 (×5): 1000 mg via ORAL
  Filled 2023-09-16 (×6): qty 2

## 2023-09-16 MED ORDER — APIXABAN 5 MG PO TABS
10.0000 mg | ORAL_TABLET | Freq: Two times a day (BID) | ORAL | Status: DC
  Administered 2023-09-16 – 2023-09-18 (×5): 10 mg via ORAL
  Filled 2023-09-16 (×5): qty 2

## 2023-09-16 MED ORDER — CEFADROXIL 500 MG PO CAPS
1000.0000 mg | ORAL_CAPSULE | Freq: Two times a day (BID) | ORAL | Status: AC
  Administered 2023-09-16 – 2023-09-17 (×3): 1000 mg via ORAL
  Filled 2023-09-16 (×3): qty 2

## 2023-09-16 MED ORDER — OXYCODONE HCL 5 MG PO TABS
5.0000 mg | ORAL_TABLET | ORAL | Status: DC | PRN
  Administered 2023-09-16 (×2): 5 mg via ORAL
  Filled 2023-09-16 (×2): qty 1

## 2023-09-16 MED ORDER — LIDOCAINE 5 % EX PTCH
1.0000 | MEDICATED_PATCH | CUTANEOUS | Status: DC
  Administered 2023-09-16 – 2023-09-18 (×3): 1 via TRANSDERMAL
  Filled 2023-09-16 (×3): qty 1

## 2023-09-16 NOTE — Discharge Instructions (Signed)
 Information on my medicine - ELIQUIS (apixaban)  Why was Eliquis prescribed for you? Eliquis was prescribed to treat blood clots that may have been found in the veins of your legs (deep vein thrombosis) or in your lungs (pulmonary embolism) and to reduce the risk of them occurring again.  What do You need to know about Eliquis ? The starting dose is 10 mg (two 5 mg tablets) taken TWICE daily for the FIRST SEVEN (7) DAYS, then on 09/23/23  the dose is reduced to ONE 5 mg tablet taken TWICE daily.  Eliquis may be taken with or without food.   Try to take the dose about the same time in the morning and in the evening. If you have difficulty swallowing the tablet whole please discuss with your pharmacist how to take the medication safely.  Take Eliquis exactly as prescribed and DO NOT stop taking Eliquis without talking to the doctor who prescribed the medication.  Stopping may increase your risk of developing a new blood clot.  Refill your prescription before you run out.  After discharge, you should have regular check-up appointments with your healthcare provider that is prescribing your Eliquis.    What do you do if you miss a dose? If a dose of ELIQUIS is not taken at the scheduled time, take it as soon as possible on the same day and twice-daily administration should be resumed. The dose should not be doubled to make up for a missed dose.  Important Safety Information A possible side effect of Eliquis is bleeding. You should call your healthcare provider right away if you experience any of the following: Bleeding from an injury or your nose that does not stop. Unusual colored urine (red or dark brown) or unusual colored stools (red or black). Unusual bruising for unknown reasons. A serious fall or if you hit your head (even if there is no bleeding).  Some medicines may interact with Eliquis and might increase your risk of bleeding or clotting while on Eliquis. To help avoid this,  consult your healthcare provider or pharmacist prior to using any new prescription or non-prescription medications, including herbals, vitamins, non-steroidal anti-inflammatory drugs (NSAIDs) and supplements.  This website has more information on Eliquis (apixaban): http://www.eliquis.com/eliquis/home

## 2023-09-16 NOTE — Progress Notes (Signed)
 ANTICOAGULATION CONSULT NOTE  Pharmacy Consult for Heparin Indication: pulmonary embolus  Allergies  Allergen Reactions   Augmentin [Amoxicillin-Pot Clavulanate]     Severe diarrhea   Sulfonamide Derivatives Hives and Rash    Patient Measurements: Height: 4\' 11"  (149.9 cm) Weight: 63.5 kg (139 lb 15.9 oz) IBW/kg (Calculated) : 43.2 Heparin Dosing Weight: 56.8 kg  Vital Signs: Temp: 98.2 F (36.8 C) (03/25 0525) Temp Source: Oral (03/24 2046) BP: 133/56 (03/25 0525) Pulse Rate: 51 (03/24 2046)  Labs: Recent Labs    09/14/23 0616 09/14/23 1410 09/15/23 0409 09/16/23 0328  HGB 13.2  --  13.0 13.1  HCT 41.4  --  40.6 41.0  PLT 290  --  289 276  HEPARINUNFRC 0.48 0.43 0.40 0.37  CREATININE  --   --  0.95 0.94    Estimated Creatinine Clearance: 41.2 mL/min (by C-G formula based on SCr of 0.94 mg/dL).   Medical History: Past Medical History:  Diagnosis Date   Acute asthmatic bronchitis    Adenocarcinoma, lung (HCC)    Allergic rhinitis    Anxiety    Carcinoma in situ of endocrine gland    Colon polyp    COPD (chronic obstructive pulmonary disease) (HCC)    Diverticulosis of colon    History of cervical cancer    Hypercholesterolemia    Microscopic hematuria    Osteoporosis     Medications:  Medications Prior to Admission  Medication Sig Dispense Refill Last Dose/Taking   acetaminophen (TYLENOL) 325 MG tablet Take 2 tablets (650 mg total) by mouth every 6 (six) hours as needed for moderate pain. 30 tablet 0 Unknown   alendronate (FOSAMAX) 70 MG tablet TAKE 1 TABLET BY MOUTH ONCE A WEEK. TAKE WITH A FULL GLASS OF WATER ON AN EMPTY STOMACH. 12 tablet 3 Past Week   aspirin 81 MG chewable tablet Chew 81 mg by mouth daily.   09/12/2023 Morning   atorvastatin (LIPITOR) 10 MG tablet TAKE 1 TABLET BY MOUTH EVERY DAY 90 tablet 3 09/12/2023 Morning   Calcium Carb-Cholecalciferol (CALTRATE BONE HEALTH PO) Take by mouth.   09/12/2023 Morning   Cholecalciferol (VITAMIN D)  2000 UNITS CAPS Take 1 capsule by mouth daily.   09/12/2023 Morning   citalopram (CELEXA) 10 MG tablet TAKE 1 TABLET BY MOUTH EVERY DAY 90 tablet 3 09/12/2023 Morning   famotidine (PEPCID) 20 MG tablet TAKE 1 TABLET BY MOUTH EVERY DAY 90 tablet 1 Unknown   Fluticasone-Umeclidin-Vilant (TRELEGY ELLIPTA) 100-62.5-25 MCG/ACT AEPB Inhale 1 puff into the lungs daily. 28 each 3 09/12/2023   memantine (NAMENDA) 10 MG tablet Take 1 tablet  10 mg 2 times a day 180 tablet 4 09/12/2023 Morning   vitamin B-12 (CYANOCOBALAMIN) 1000 MCG tablet Take 1 tablet (1,000 mcg total) by mouth daily. 90 tablet 1 09/12/2023 Morning   Scheduled:   atorvastatin  10 mg Oral Daily   citalopram  10 mg Oral Daily   cyanocobalamin  1,000 mcg Oral Daily   famotidine  20 mg Oral Daily   fluticasone furoate-vilanterol  1 puff Inhalation Daily   And   umeclidinium bromide  1 puff Inhalation Daily   memantine  10 mg Oral BID   Infusions:   cefTRIAXone (ROCEPHIN)  IV Stopped (09/16/23 0119)   heparin 800 Units/hr (09/15/23 0423)   PRN: mouth rinse  Assessment: 77 yof with a history of COPD, lung adenocarcinoma status resection . Patient is presenting with midsternal chest pain, SOB, productive cough . Heparin per pharmacy consult placed for  pulmonary embolus.  CTA PE w/ peripheral pulmonary emboli on left w/out RHS  Patient is not on anticoagulation prior to arrival.  D/w Dr. Allena Katz, ok to change anticoagulation to apixaban but pulm wants to hold off for repeat thoracentesis.   3/25 AM: Heparin level therapeutic at 0.37 (stable) with heparin running at 800 units/hour. CBC WNL. Pharmacy now consulted to transition to apixaban.    Goal of Therapy:  Monitor platelets by anticoagulation protocol: Yes   Plan:  STOP heparin infusion START apixaban 10 mg BID x 7 days, followed by 5 mg BID- administer first dose at the same time heparin infusion is stopped  Plan communicated to primary RN   Jani Gravel, PharmD Clinical  Pharmacist  09/16/2023 8:14 AM

## 2023-09-16 NOTE — Evaluation (Signed)
 Physical Therapy Evaluation Patient Details Name: Pamela Terry MRN: [id number] DOB: 20-Sep-1945 Today's Date: 09/16/2023  History of Present Illness  Pt is 78 yo female who presents on 09/12/23 with midsternal chest pain, SOB, productive cough with clear/yellow sputum. CT showed PE, large right pleural effusion, and PNA. PMH: lung cancer, COPD, cervical cancer, osteoporosis, asthmatic bronchitis, HTN, dementia  Clinical Impression  Pt admitted with above diagnosis. Pt from home with husband, pt is a poor historian and no family present so difficult to get home set up and PLOF. Pt safe with bed mobility and transfers but when she begins to ambulate she is SOB and mildly dizzy even on supplemental O2. SPO2 dropped to 85% on RA and 89% on 3L O2 with ambulation. Pt could tolerate only 12' ambulation at a time before needing to take a seated rest break. This is a significant decline as she was regularly walking next door to visit her son until a few days ago. Pt unsteady without an AD, much better with RW. Will need youth height for home as she is <5'2". Recommend HHPT at d/c. Do not feel pt is safe to go home yet.  Pt currently with functional limitations due to the deficits listed below (see PT Problem List). Pt will benefit from acute skilled PT to increase their independence and safety with mobility to allow discharge.           If plan is discharge home, recommend the following: A little help with walking and/or transfers;A little help with bathing/dressing/bathroom;Assistance with cooking/housework;Assist for transportation;Help with stairs or ramp for entrance   Can travel by private vehicle        Equipment Recommendations Rolling walker (2 wheels) (youth height)  Recommendations for Other Services  OT consult    Functional Status Assessment Patient has had a recent decline in their functional status and demonstrates the ability to make significant improvements in function in a reasonable  and predictable amount of time.     Precautions / Restrictions Precautions Precautions: Fall;Other (comment) Recall of Precautions/Restrictions: Impaired Precaution/Restrictions Comments: watch O2 sats Restrictions Weight Bearing Restrictions Per Provider Order: No      Mobility  Bed Mobility Overal bed mobility: Modified Independent             General bed mobility comments: pt able to get in and out of bed safely without assist    Transfers Overall transfer level: Needs assistance Equipment used: None, Rolling walker (2 wheels) Transfers: Sit to/from Stand Sit to Stand: Supervision           General transfer comment: pt stands without difficulty, supervision for safety but no LOB    Ambulation/Gait Ambulation/Gait assistance: Min assist Gait Distance (Feet): 12 Feet (2x) Assistive device: Rolling walker (2 wheels), None Gait Pattern/deviations: Step-through pattern, Staggering left, Staggering right Gait velocity: decreased Gait velocity interpretation: <1.31 ft/sec, indicative of household ambulator   General Gait Details: pt first walked without AD and was very unsteady, reaching for stable surfaces and occasionally staggering. Pt needed to turn back to have seated rest after 6' (for 12' total) each time. Much steadier with RW. SPO2 dropped to 89% on 3L O2 with ambulation  Stairs            Wheelchair Mobility     Tilt Bed    Modified Rankin (Stroke Patients Only)       Balance Overall balance assessment: Needs assistance Sitting-balance support: No upper extremity supported, Feet supported Sitting balance-Leahy Scale: Normal  Standing balance support: No upper extremity supported Standing balance-Leahy Scale: Poor Standing balance comment: unable to accept challenges in any direction                             Pertinent Vitals/Pain Pain Assessment Pain Assessment: No/denies pain    Home Living Family/patient expects  to be discharged to:: Private residence Living Arrangements: Spouse/significant other Available Help at Discharge: Family;Available 24 hours/day Type of Home: House Home Access: Stairs to enter Entrance Stairs-Rails: Right Entrance Stairs-Number of Steps: 3   Home Layout: One level Home Equipment: None Additional Comments: info about home is from pt who is not a reliable historian so may be incorrect    Prior Function Prior Level of Function : Needs assist  Cognitive Assist : Mobility (cognitive)           Mobility Comments: pt reports she does not ambulate with AD and that she could easily walk next door to her son's home and then she suddenly could not because of SOB ADLs Comments: unsure, pt reports independence. Assume she was having assist with IADL's due to demenia     Extremity/Trunk Assessment   Upper Extremity Assessment Upper Extremity Assessment: Generalized weakness    Lower Extremity Assessment Lower Extremity Assessment: Generalized weakness    Cervical / Trunk Assessment Cervical / Trunk Assessment: Kyphotic  Communication   Communication Communication: Impaired Factors Affecting Communication: Difficulty expressing self    Cognition Arousal: Alert Behavior During Therapy: WFL for tasks assessed/performed   PT - Cognitive impairments: History of cognitive impairments                       PT - Cognition Comments: STM not intact and pt able to relay this. Has expressive deficits on top of this. Does follow commands. Limited insight into decreased safety Following commands: Intact       Cueing Cueing Techniques: Verbal cues     General Comments General comments (skin integrity, edema, etc.): spoke with RN about getting pt an IS.    Exercises     Assessment/Plan    PT Assessment Patient needs continued PT services  PT Problem List Decreased strength;Decreased activity tolerance;Decreased balance;Decreased mobility;Decreased  cognition;Decreased knowledge of use of DME;Decreased safety awareness;Decreased knowledge of precautions;Cardiopulmonary status limiting activity       PT Treatment Interventions DME instruction;Gait training;Stair training;Functional mobility training;Therapeutic activities;Therapeutic exercise;Balance training;Patient/family education    PT Goals (Current goals can be found in the Care Plan section)  Acute Rehab PT Goals Patient Stated Goal: get better PT Goal Formulation: With patient Time For Goal Achievement: 09/30/23 Potential to Achieve Goals: Good    Frequency Min 2X/week     Co-evaluation               AM-PAC PT "6 Clicks" Mobility  Outcome Measure Help needed turning from your back to your side while in a flat bed without using bedrails?: None Help needed moving from lying on your back to sitting on the side of a flat bed without using bedrails?: None Help needed moving to and from a bed to a chair (including a wheelchair)?: None Help needed standing up from a chair using your arms (e.g., wheelchair or bedside chair)?: A Little Help needed to walk in hospital room?: A Little Help needed climbing 3-5 steps with a railing? : A Lot 6 Click Score: 20    End of Session Equipment Utilized During  Treatment: Gait belt;Oxygen Activity Tolerance: Treatment limited secondary to medical complications (Comment);Patient limited by fatigue (SOB) Patient left: in bed;with call bell/phone within reach Nurse Communication: Mobility status PT Visit Diagnosis: Unsteadiness on feet (R26.81);Muscle weakness (generalized) (M62.81);Difficulty in walking, not elsewhere classified (R26.2)    Time: 1202-1218 PT Time Calculation (min) (ACUTE ONLY): 16 min   Charges:   PT Evaluation $PT Eval Moderate Complexity: 1 Mod   PT General Charges $$ ACUTE PT VISIT: 1 Visit         Lyanne Co, PT  Acute Rehab Services Secure chat preferred Office [phone]   Lawana Chambers  Lynnmarie Lovett 09/16/2023, 2:06 PM

## 2023-09-16 NOTE — Progress Notes (Signed)
 Supplemental PT Note  SATURATION QUALIFICATIONS: (This note is used to comply with regulatory documentation for home oxygen)  Patient Saturations on Room Air at Rest = 93%  Patient Saturations on Room Air while Ambulating = 85%  Patient Saturations on 3 Liters of oxygen while Ambulating = 89%  Please briefly explain why patient needs home oxygen:Pt with O2 desaturation when mobilizing on RA. SPO2 remains upper 80's low 90's on 3L O2.   Lyanne Co, PT  Acute Rehab Services Secure chat preferred Office [phone]

## 2023-09-16 NOTE — Progress Notes (Signed)
 [facility] Progress Note Patient: Pamela Terry ZOX:[id number] DOB: 06/09/1946 DOA: 09/12/2023  DOS: the patient was seen and examined on 09/16/2023  Brief Hospital Course: PMH of right lung adenocarcinoma, lost to follow-up, COPD, dementia, HTN, depression, cervical cancer. Presented to hospital with complaints of cough shortness of breath as well as right-sided chest pain. Workup showed small left PE, large right effusion as well as possible pneumonia with right endobronchial lesion. IR and pulmonology were consulted. Underwent 2 thoracentesis with 2 L total removed so far. Cytology positive for adenocarcinoma. Currently awaiting PT repeat eval. Assessment and Plan: Malignant pleural effusion on right  Presents with progressively worsening shortness of breath.  Symptoms actually ongoing for last few weeks. Chest x-ray and CT scan shows evidence of large pleural effusion.  Underwent thoracentesis x 2 with 1 L fluid removed each time. Cultures negative for any infection. Cytology came back positive for adenocarcinoma. Pulmonary consult appreciated.  Bacterial pneumonia. CT scan shows evidence of possible pneumonia. Presented with shortness of breath and cough. COVID-negative.  Currently on oral antibiotic.  History of adenocarcinoma of the right lung. In the past treated with lobectomy. Then lost to follow-up since 2023. At the time, was recommended PET scan outpatient but never followed up. Cytology of the pleural fluid is positive for adenocarcinoma. Pulmonary is aware.  Patient will require an outpatient PET scan as well as further workup. I have placed a referral to oncology as well.  Small acute left PE. Less likely cause of her hypoxia. Continue with supplemental oxygen as well as anticoagulation.  Based on family preference we will switch to Eliquis.  Dementia. Monitor.  At risk for delirium.  HLD. Continue statin.  Mood disorder. Continuing home  regimen.  COPD. Currently no evidence of exacerbation.  Continue inhalers   Subjective: No nausea no vomiting no fever no chills.  Reported some chest pain which appears to be improving today with medicine.  Physical Exam: General: in moderate distress, No Rash, hard of hearing Cardiovascular: S1 and S2 Present, No Murmur Respiratory: Good respiratory effort, Bilateral Air entry present.  Right basal crackles, No wheezes Abdomen: Bowel Sound present, No tenderness Extremities: No edema Neuro: Alert and oriented x3, no new focal deficit  Data Reviewed: I have Reviewed nursing notes, Vitals, and Lab results. Since last encounter, pertinent lab results CBC and BMP   . I have ordered test including CBC and BMP  . I have discussed pt's care plan and test results with pulmonary, oncology  .   Disposition: Status is: Inpatient Remains inpatient appropriate because: Awaiting PT reevaluation.  apixaban (ELIQUIS) tablet 10 mg  apixaban (ELIQUIS) tablet 5 mg   Family Communication: Discussed with husband on the phone.  Unable to reach daughter-in-law. Level of care: Telemetry Medical   Vitals:   09/16/23 0525 09/16/23 0814 09/16/23 0839 09/16/23 1655  BP: (!) 133/56 (!) 124/41 127/74 (!) 118/44  Pulse:  (!) 53 (!) 53 (!) 49  Resp: 18 18 18 18   Temp: 98.2 F (36.8 C) 97.8 F (36.6 C) (!) 97.5 F (36.4 C) 98.5 F (36.9 C)  TempSrc:  Oral Oral   SpO2: 98% 96% 98% 94%  Weight:      Height:         Author: Lynden Oxford, MD 09/16/2023 6:22 PM  Please look on [email or web address] to find out who is on call.

## 2023-09-17 ENCOUNTER — Inpatient Hospital Stay (HOSPITAL_COMMUNITY)

## 2023-09-17 DIAGNOSIS — I2699 Other pulmonary embolism without acute cor pulmonale: Secondary | ICD-10-CM | POA: Diagnosis not present

## 2023-09-17 DIAGNOSIS — C3490 Malignant neoplasm of unspecified part of unspecified bronchus or lung: Secondary | ICD-10-CM

## 2023-09-17 LAB — BASIC METABOLIC PANEL
Anion gap: 6 (ref 5–15)
BUN: 13 mg/dL (ref 8–23)
CO2: 27 mmol/L (ref 22–32)
Calcium: 8.7 mg/dL — ABNORMAL LOW (ref 8.9–10.3)
Chloride: 106 mmol/L (ref 98–111)
Creatinine, Ser: 1.19 mg/dL — ABNORMAL HIGH (ref 0.44–1.00)
GFR, Estimated: 47 mL/min — ABNORMAL LOW (ref >60)
Glucose, Bld: 93 mg/dL (ref 70–99)
Potassium: 3.8 mmol/L (ref 3.5–5.1)
Sodium: 139 mmol/L (ref 135–145)

## 2023-09-17 LAB — CBC
HCT: 41.6 % (ref 36.0–46.0)
Hemoglobin: 13.1 g/dL (ref 12.0–15.0)
MCH: 28.5 pg (ref 26.0–34.0)
MCHC: 31.5 g/dL (ref 30.0–36.0)
MCV: 90.4 fL (ref 80.0–100.0)
Platelets: 280 10*3/uL (ref 150–400)
RBC: 4.6 MIL/uL (ref 3.87–5.11)
RDW: 14 % (ref 11.5–15.5)
WBC: 9.7 10*3/uL (ref 4.0–10.5)
nRBC: 0 % (ref 0.0–0.2)

## 2023-09-17 LAB — MAGNESIUM: Magnesium: 2.2 mg/dL (ref 1.7–2.4)

## 2023-09-17 NOTE — Consult Note (Signed)
 [facility] CONSULT NOTE  Patient Care Team: Corwin Levins, MD as PCP - General (Internal Medicine) Sallye Lat, MD as Consulting Physician (Ophthalmology) Maeola Sarah, MD as Referring Physician (Ophthalmology)  CHIEF COMPLAINTS/PURPOSE OF CONSULTATION:  Metastatic adenocarcinoma  HISTORY OF PRESENTING ILLNESS:  Pamela Terry is a 78 year old with a previous history of lung cancer diagnosed in 2013 status post lobectomy performed at [facility].  She does not remember to many details of the stage or any follow-up subsequently.  It does not appear that she followed up from the lung cancer standpoint.  She presented to the hospital with complaints of shortness of breath and right-sided chest pain.  She was noted to have a small pulmonary embolism but a very large right lung effusion as well as possible pneumonia with a right endobronchial lesion.  She underwent thoracentesis and 2 L was removed and cytology was positive for adenocarcinoma. We are consulted to assist with treatment planning. I spoke to her at length about her life and her current situation.  Her family arrived towards the end and we spent some additional time discussing the goals of care.  Her son and her husband and her daughter-in-law were in the room.  I reviewed her records extensively and collaborated the history with the patient.   MEDICAL HISTORY:  Past Medical History:  Diagnosis Date   Acute asthmatic bronchitis    Adenocarcinoma, lung (HCC)    Allergic rhinitis    Anxiety    Carcinoma in situ of endocrine gland    Colon polyp    COPD (chronic obstructive pulmonary disease) (HCC)    Diverticulosis of colon    History of cervical cancer    Hypercholesterolemia    Microscopic hematuria    Osteoporosis     SURGICAL HISTORY: Past Surgical History:  Procedure Laterality Date   ABDOMINAL HYSTERECTOMY     in her 57's   APPENDECTOMY     IR THORACENTESIS ASP PLEURAL SPACE W/IMG GUIDE   09/15/2023   TOTAL ABDOMINAL HYSTERECTOMY W/ BILATERAL SALPINGOOPHORECTOMY  1985   Dr Elana Alm for Ca of cervix    SOCIAL HISTORY: Social History   Socioeconomic History   Marital status: Married    Spouse name: Not on file   Number of children: 1   Years of education: Not on file   Highest education level: Not on file  Occupational History   Occupation: Scientific laboratory technician  Tobacco Use   Smoking status: Former    Current packs/day: 0.00    Average packs/day: 2.0 packs/day for 23.0 years (46.0 ttl pk-yrs)    Types: Cigarettes    Start date: 06/24/1962    Quit date: 06/24/1985    Years since quitting: 38.2   Smokeless tobacco: Never  Vaping Use   Vaping status: Never Used  Substance and Sexual Activity   Alcohol use: No    Alcohol/week: 0.0 standard drinks of alcohol   Drug use: No   Sexual activity: Never  Other Topics Concern   Not on file  Social History Narrative   Regular exercise: walkCaffeine use: 3 sodas daily, sweet tea   Right handed   No caffeine   Lives with husband   Social Drivers of Corporate investment banker Strain: Low Risk  (08/02/2020)   Overall Financial Resource Strain (CARDIA)    Difficulty of Paying Living Expenses: Not hard at all  Food Insecurity: Patient Declined (09/13/2023)   Hunger Vital Sign    Worried About Running Out of Food  in the Last Year: Patient declined    Ran Out of Food in the Last Year: Patient declined  Transportation Needs: No Transportation Needs (09/13/2023)   PRAPARE - Administrator, Civil Service (Medical): No    Lack of Transportation (Non-Medical): No  Physical Activity: Sufficiently Active (08/02/2020)   Exercise Vital Sign    Days of Exercise per Week: 5 days    Minutes of Exercise per Session: 30 min  Stress: No Stress Concern Present (08/02/2020)   [employer] of Occupational Health - Occupational Stress Questionnaire    Feeling of Stress : Not at all  Social Connections: Unknown (09/14/2023)   Social  Connection and Isolation Panel [NHANES]    Frequency of Communication with Friends and Family: Patient unable to answer    Frequency of Social Gatherings with Friends and Family: Patient unable to answer    Attends Religious Services: Patient unable to answer    Active Member of Clubs or Organizations: Patient unable to answer    Attends Banker Meetings: Patient unable to answer    Marital Status: Married  Catering manager Violence: Not At Risk (09/13/2023)   Humiliation, Afraid, Rape, and Kick questionnaire    Fear of Current or Ex-Partner: No    Emotionally Abused: No    Physically Abused: No    Sexually Abused: No    FAMILY HISTORY: Family History  Problem Relation Age of Onset   Asthma Maternal Aunt    Heart disease Father    Hyperlipidemia Father    Stroke Father    Heart disease Paternal Grandfather    Heart disease Maternal Grandmother    Rheum arthritis Mother    Breast cancer Paternal Grandmother    Prostate cancer Maternal Grandfather    Diabetes Brother     ALLERGIES:  is allergic to augmentin [amoxicillin-pot clavulanate] and sulfonamide derivatives.  MEDICATIONS:  Current Facility-Administered Medications  Medication Dose Route Frequency Provider Last Rate Last Admin   acetaminophen (TYLENOL) tablet 1,000 mg  1,000 mg Oral Q8H Rolly Salter, MD   1,000 mg at 09/17/23 1621   apixaban (ELIQUIS) tablet 10 mg  10 mg Oral BID Paytes, Florentina Addison, RPH   10 mg at 09/17/23 1610   Followed by   Melene Muller ON 09/23/2023] apixaban (ELIQUIS) tablet 5 mg  5 mg Oral BID Paytes, Austin A, RPH       atorvastatin (LIPITOR) tablet 10 mg  10 mg Oral Daily Rolly Salter, MD   10 mg at 09/17/23 9604   cefadroxil (DURICEF) capsule 1,000 mg  1,000 mg Oral BID Rolly Salter, MD   1,000 mg at 09/17/23 0915   citalopram (CELEXA) tablet 10 mg  10 mg Oral Daily Buena Irish, MD   10 mg at 09/17/23 5409   cyanocobalamin (VITAMIN B12) tablet 1,000 mcg  1,000 mcg Oral Daily  Rolly Salter, MD   1,000 mcg at 09/17/23 0915   famotidine (PEPCID) tablet 20 mg  20 mg Oral Daily Buena Irish, MD   20 mg at 09/17/23 0918   fluticasone furoate-vilanterol (BREO ELLIPTA) 100-25 MCG/ACT 1 puff  1 puff Inhalation Daily Buena Irish, MD   1 puff at 09/17/23 0912   And   umeclidinium bromide (INCRUSE ELLIPTA) 62.5 MCG/ACT 1 puff  1 puff Inhalation Daily Buena Irish, MD   1 puff at 09/17/23 0914   lidocaine (LIDODERM) 5 % 1 patch  1 patch Transdermal Q24H Rolly Salter, MD   1 patch at  09/17/23 0913   memantine (NAMENDA) tablet 10 mg  10 mg Oral BID Buena Irish, MD   10 mg at 09/17/23 8119   Oral care mouth rinse  15 mL Mouth Rinse PRN Rolly Salter, MD       oxyCODONE (Oxy IR/ROXICODONE) immediate release tablet 5 mg  5 mg Oral Q4H PRN Rolly Salter, MD   5 mg at 09/16/23 2202    REVIEW OF SYSTEMS:   Constitutional: Denies fevers, chills or abnormal night sweats Shortness of breath All other systems were reviewed with the patient and are negative.  PHYSICAL EXAMINATION: ECOG PERFORMANCE STATUS: 3 - Symptomatic, >50% confined to bed  Vitals:   09/17/23 0819 09/17/23 1728  BP: (!) 141/50 (!) 150/61  Pulse: (!) 46 (!) 54  Resp: 18 18  Temp: (!) 97.4 F (36.3 C)   SpO2: 95% 95%   Filed Weights   09/12/23 1737  Weight: 139 lb 15.9 oz (63.5 kg)    GENERAL:alert, no distress and comfortable but requiring oxygen by nasal cannula  LABORATORY DATA:  I have reviewed the data as listed Lab Results  Component Value Date   WBC 9.7 09/17/2023   HGB 13.1 09/17/2023   HCT 41.6 09/17/2023   MCV 90.4 09/17/2023   PLT 280 09/17/2023   Lab Results  Component Value Date   NA 139 09/17/2023   K 3.8 09/17/2023   CL 106 09/17/2023   CO2 27 09/17/2023    RADIOGRAPHIC STUDIES: I have personally reviewed the radiological reports and agreed with the findings in the report.  ASSESSMENT AND PLAN:  Metastatic adenocarcinoma lung: Status post  thoracentesis and cytology was positive for adenocarcinoma.  Based on the CT findings of endobronchial lesion and pleural effusion we can confirm that this is recurrence of her lung cancer.  I discussed with her that any cancer of the lung that metastasizes to the pleural fluid is considered stage IV.  Patient's performance status is extremely poor and her family also report that she does not do much activity.  After discussing pros and cons of different approaches to treatment, patient and her family both agreed that they want to be kept comfortable and hospice care would be the most rational thing to do.  No additional recommendations from oncology standpoint.  All questions were answered. The patient knows to call the clinic with any problems, questions or concerns.    Tamsen Meek, MD @T @

## 2023-09-17 NOTE — Progress Notes (Signed)
 Physical Therapy Treatment Patient Details Name: Pamela Terry MRN: [id number] DOB: 1946-06-14 Today's Date: 09/17/2023   History of Present Illness Pt is 78 yo female who presents on 09/12/23 with midsternal chest pain, SOB, productive cough with clear/yellow sputum. CT showed PE, large right pleural effusion, and PNA. PMH: lung cancer, COPD, cervical cancer, osteoporosis, asthmatic bronchitis, HTN, dementia    PT Comments  Pt admitted with above diagnosis. Pt was able to ambulate with RW with min assist and min cues. Husband present and discussed with him how to assist pt. Discussed that she will need O2 as well. Husband states he can assist her at home.  Pt currently with functional limitations due to the deficits listed below (see PT Problem List). Pt will benefit from acute skilled PT to increase their independence and safety with mobility to allow discharge.       If plan is discharge home, recommend the following: A little help with walking and/or transfers;A little help with bathing/dressing/bathroom;Assistance with cooking/housework;Assist for transportation;Help with stairs or ramp for entrance   Can travel by private vehicle        Equipment Recommendations  Rolling walker (2 wheels) (youth height; Home O2)    Recommendations for Other Services OT consult     Precautions / Restrictions Precautions Precautions: Fall;Other (comment) Recall of Precautions/Restrictions: Impaired Precaution/Restrictions Comments: watch O2 sats Restrictions Weight Bearing Restrictions Per Provider Order: No     Mobility  Bed Mobility Overal bed mobility: Modified Independent             General bed mobility comments: pt able to get in and out of bed safely without assist    Transfers Overall transfer level: Needs assistance Equipment used: None, Rolling walker (2 wheels) Transfers: Sit to/from Stand Sit to Stand: Supervision           General transfer comment: pt stands  without difficulty, supervision for safety but no LOB    Ambulation/Gait Ambulation/Gait assistance: Min assist Gait Distance (Feet): 60 Feet Assistive device: Rolling walker (2 wheels), None Gait Pattern/deviations: Step-through pattern, Staggering left, Staggering right, Trunk flexed, Wide base of support Gait velocity: decreased Gait velocity interpretation: <1.31 ft/sec, indicative of household ambulator   General Gait Details: pt first walked without AD and was very unsteady, reaching for stable surfaces and occasionally staggering. Pt is much steadier with RW. Pt needs assist to steer RW as she runs it into furniture in more enclosed spaces therefore needs min cues and min assist at times. SPO2 dropped to 82% on RA and pt needed 3L O2 with ambulation for sats >90%. Pt needed cues to hold onto RW correctly as she kept placing her hands under the hand grip or on the furniture.   Stairs             Wheelchair Mobility     Tilt Bed    Modified Rankin (Stroke Patients Only)       Balance Overall balance assessment: Needs assistance Sitting-balance support: No upper extremity supported, Feet supported Sitting balance-Leahy Scale: Normal     Standing balance support: No upper extremity supported Standing balance-Leahy Scale: Poor Standing balance comment: unable to accept challenges in any direction without device and even with device needs min assist.                            Communication Communication Communication: Impaired Factors Affecting Communication: Difficulty expressing self  Cognition Arousal: Alert Behavior During Therapy:  WFL for tasks assessed/performed   PT - Cognitive impairments: History of cognitive impairments                       PT - Cognition Comments: STM not intact and pt able to relay this. Has expressive deficits on top of this. Does follow commands. Limited insight into decreased safety Following commands:  Intact      Cueing Cueing Techniques: Verbal cues  Exercises      General Comments        Pertinent Vitals/Pain Pain Assessment Pain Assessment: No/denies pain Breathing: normal Negative Vocalization: none Body Language: relaxed Consolability: no need to console    Home Living                          Prior Function            PT Goals (current goals can now be found in the care plan section) Acute Rehab PT Goals Patient Stated Goal: get better Progress towards PT goals: Progressing toward goals    Frequency    Min 2X/week      PT Plan      Co-evaluation              AM-PAC PT "6 Clicks" Mobility   Outcome Measure  Help needed turning from your back to your side while in a flat bed without using bedrails?: None Help needed moving from lying on your back to sitting on the side of a flat bed without using bedrails?: None Help needed moving to and from a bed to a chair (including a wheelchair)?: None Help needed standing up from a chair using your arms (e.g., wheelchair or bedside chair)?: A Little Help needed to walk in hospital room?: A Little Help needed climbing 3-5 steps with a railing? : A Lot 6 Click Score: 20    End of Session Equipment Utilized During Treatment: Gait belt;Oxygen Activity Tolerance: Treatment limited secondary to medical complications (Comment);Patient limited by fatigue (SOB) Patient left: with call bell/phone within reach;in chair;with family/visitor present Nurse Communication: Mobility status PT Visit Diagnosis: Unsteadiness on feet (R26.81);Muscle weakness (generalized) (M62.81);Difficulty in walking, not elsewhere classified (R26.2)     Time: 4098-1191 PT Time Calculation (min) (ACUTE ONLY): 21 min  Charges:    $Gait Training: 8-22 mins PT General Charges $$ ACUTE PT VISIT: 1 Visit                     Kam Kushnir M,PT Acute Rehab Services [phone]    Pamela Terry 09/17/2023, 10:23 AM

## 2023-09-17 NOTE — TOC Progression Note (Signed)
 Transition of Care [facility]) - Progression Note    Patient Details  Name: Pamela Terry MRN: [id number] Date of Birth: Apr 10, 1946  Transition [facility] CM/SW Contact  Tom-Johnson, Hershal Coria, RN Phone Number: 09/17/2023, 11:30 AM  Clinical Narrative:     CM spoke with patient at bedside about home health recommendation. Patient requested CM speak with her husband. CM called and spoke with patient's husband, [name] [phone]) and he states he has no preference.  CM called in referral to [facility] and Amy voiced acceptance, info on AVS.   CM informed [name] of Youth walker order and [name] declined, stating patient has a walker at home left by her mother and she will use that.   CM will continue to follow as patient progresses with care towards discharge.    Expected Discharge Plan and Services                                   HH Arranged: PT, OT HH Agency: [facility] Date [facility] Agency Contacted: 09/17/23 Time HH Agency Contacted: [phone] Representative spoke with at [facility] Agency: Amy   Social Determinants of Health (SDOH) Interventions SDOH Screenings   Food Insecurity: Patient Declined (09/13/2023)  Housing: Low Risk  (09/14/2023)  Transportation Needs: No Transportation Needs (09/13/2023)  Utilities: Not At Risk (09/13/2023)  Alcohol Screen: Low Risk  (08/02/2020)  Depression (PHQ2-9): Low Risk  (12/19/2022)  Financial Resource Strain: Low Risk  (08/02/2020)  Physical Activity: Sufficiently Active (08/02/2020)  Social Connections: Unknown (09/14/2023)  Stress: No Stress Concern Present (08/02/2020)  Tobacco Use: Medium Risk (09/12/2023)    Readmission Risk Interventions    09/15/2023   11:19 AM  Readmission Risk Prevention Plan  Post Dischage Appt Complete  Medication Screening Complete  Transportation Screening Complete

## 2023-09-17 NOTE — Evaluation (Signed)
 Occupational Therapy Evaluation Patient Details Name: Pamela Terry MRN: [id number] DOB: 14-Oct-1945 Today's Date: 09/17/2023   History of Present Illness   Pt is 78 yo female who presents on 09/12/23 with midsternal chest pain, SOB, productive cough with clear/yellow sputum. CT showed PE, large right pleural effusion, and PNA. PMH: lung cancer, COPD, cervical cancer, osteoporosis, asthmatic bronchitis, HTN, dementia     Clinical Impressions Pt c/o SOB. Pt poor historian, states she lives at home with husband, PLOF independent. Pt currently limited due to SOB and poor activity tolerance. Pt has slow gait using RW and able to ambulate 15 feet before needing to sit down, desats to 78%, requires 2L O2 via [place] to remain above 93%. Pt able to complete ADLs with set up/supervision, get in/out of bed independently with increased time. Pt would benefit from [facility] follow up to maximize safety and independence in home setting, will continue to follow acutely to maximize activity tolerance. Youth RW recommended for return home.      If plan is discharge home, recommend the following:   A little help with walking and/or transfers;A little help with bathing/dressing/bathroom;Assistance with cooking/housework;Assist for transportation;Help with stairs or ramp for entrance     Functional Status Assessment   Patient has had a recent decline in their functional status and demonstrates the ability to make significant improvements in function in a reasonable and predictable amount of time.     Equipment Recommendations   Other (comment) (youth RW)     Recommendations for Other Services         Precautions/Restrictions   Precautions Precautions: Fall;Other (comment) Recall of Precautions/Restrictions: Impaired Precaution/Restrictions Comments: watch O2 sats Restrictions Weight Bearing Restrictions Per Provider Order: No     Mobility Bed Mobility Overal bed mobility: Modified  Independent                  Transfers Overall transfer level: Needs assistance Equipment used: None, Rolling walker (2 wheels) Transfers: Sit to/from Stand, Bed to chair/wheelchair/BSC Sit to Stand: Supervision     Step pivot transfers: Supervision     General transfer comment: supervision for safety, with without AD, desats quickly.      Balance Overall balance assessment: Needs assistance Sitting-balance support: No upper extremity supported, Feet supported Sitting balance-Leahy Scale: Normal     Standing balance support: Single extremity supported, During functional activity Standing balance-Leahy Scale: Poor Standing balance comment: Pt requires one hand supported while standing to maintain balance                           ADL either performed or assessed with clinical judgement   ADL Overall ADL's : Needs assistance/impaired                                       General ADL Comments: set up/supervision, decreased activity tolerance, desats to high 70s with OOB activity, requires 2L O2 and several minutes to return to 93% and above.     Vision Baseline Vision/History: 6 Macular Degeneration Ability to See in Adequate Light: 1 Impaired Patient Visual Report: No change from baseline       Perception         Praxis         Pertinent Vitals/Pain Pain Assessment Pain Assessment: No/denies pain     Extremity/Trunk Assessment Upper Extremity Assessment Upper Extremity Assessment:  Overall WFL for tasks assessed;RUE deficits/detail;LUE deficits/detail RUE Deficits / Details: Pt functional, has some stiffness in B shoulder, Pt reports numbness off and on. RUE Sensation: WNL RUE Coordination: WNL LUE Deficits / Details: Pt functional, has some stiffness in B shoulder LUE Sensation: WNL LUE Coordination: WNL           Communication Communication Communication: Impaired Factors Affecting Communication: Difficulty  expressing self   Cognition Arousal: Alert Behavior During Therapy: WFL for tasks assessed/performed Cognition: History of cognitive impairments             OT - Cognition Comments: Pt alert and oriented to self, location, situation, and year.                 Following commands: Intact       Cueing  General Comments   Cueing Techniques: Verbal cues  desats to 78% with OOB activity 15 ft ambulation, requires 2L O2 to recover to 93% after several minutes   Exercises     Shoulder Instructions      Home Living Family/patient expects to be discharged to:: Private residence Living Arrangements: Spouse/significant other Available Help at Discharge: Family;Available 24 hours/day Type of Home: House Home Access: Stairs to enter [employer] of Steps: 3 Entrance Stairs-Rails: Right Home Layout: One level     Bathroom Shower/Tub: Tub/shower unit         Home Equipment: Agricultural consultant (2 wheels);Wheelchair - manual   Additional Comments: poor historian      Prior Functioning/Environment Prior Level of Function : Needs assist             Mobility Comments: poor historian, reports no AD ADLs Comments: Pt poor historian but reports independent with ADLs, some help with IADLs    OT Problem List: Decreased strength;Decreased activity tolerance;Impaired balance (sitting and/or standing);Cardiopulmonary status limiting activity   OT Treatment/Interventions: Self-care/ADL training;Therapeutic exercise;Energy conservation;DME and/or AE instruction;Therapeutic activities;Patient/family education      OT Goals(Current goals can be found in the care plan section)   Acute Rehab OT Goals Patient Stated Goal: to improve breathing OT Goal Formulation: With patient Time For Goal Achievement: 10/01/23 Potential to Achieve Goals: Good   OT Frequency:  Min 2X/week    Co-evaluation              AM-PAC OT "6 Clicks" Daily Activity     Outcome  Measure Help from another person eating meals?: None Help from another person taking care of personal grooming?: A Little Help from another person toileting, which includes using toliet, bedpan, or urinal?: A Little Help from another person bathing (including washing, rinsing, drying)?: A Little Help from another person to put on and taking off regular upper body clothing?: A Little Help from another person to put on and taking off regular lower body clothing?: A Little 6 Click Score: 19   End of Session Equipment Utilized During Treatment: Gait belt;Rolling walker (2 wheels) Nurse Communication: Mobility status  Activity Tolerance: Patient tolerated treatment well Patient left: in bed;with call bell/phone within reach  OT Visit Diagnosis: Unsteadiness on feet (R26.81);Muscle weakness (generalized) (M62.81)                Time: 6213-0865 OT Time Calculation (min): 34 min Charges:  OT General Charges $OT Visit: 1 Visit OT Evaluation $OT Eval Low Complexity: 1 Low OT Treatments $Self Care/Home Management : 8-22 mins  [place], OTR/L   Alexis Goodell 09/17/2023, 11:49 AM

## 2023-09-17 NOTE — Plan of Care (Signed)
   Problem: Elimination: Goal: Will not experience complications related to bowel motility Outcome: Progressing   Problem: Pain Managment: Goal: General experience of comfort will improve and/or be controlled Outcome: Progressing

## 2023-09-17 NOTE — Consult Note (Incomplete)
 [facility] CONSULT NOTE  Patient Care Team: Corwin Levins, MD as PCP - General (Internal Medicine) Sallye Lat, MD as Consulting Physician (Ophthalmology) Maeola Sarah, MD as Referring Physician (Ophthalmology)  CHIEF COMPLAINTS/PURPOSE OF CONSULTATION:  Lung CA recurrence PE  REFERRING PHYSICIAN: Dr. Sharolyn Douglas  HISTORY OF PRESENTING ILLNESS:  Pamela Terry 78 y.o. female who came to the ED on 09/12/2023 with complaint of chest tightness, cough, shortness of breath and congestion x 1 month.   Workup was done in the ED including a CT angio of chest which showed peripheral pulmonary emboli on the left.  She also has a history of lung cancer in 2000 and cervical cancer in 1985.  Due to concern for lung cancer recurrence and PE, hematology oncology evaluation has been requested. Patient is seen awake and alert laying in bed.  She states she does not "feel good".  Patient responds well verbally however she is a poor historian.  Does not recall why she is in the hospital although she is aware of being in the hospital.  Furthermore, she does not remember details of her medical history and states to call her daughter, then stated to call husband or son or daughter-in-law. Medical history is obtained from the chart and is significant for dementia, adenocarcinoma of right lung, cervical cancer, hypertension, anxiety and depression. Social history is obtained from the chart.  She apparently quit smoking 38 years ago,, started smoking 61 years ago.  No alcohol use.  No drug use.    I have reviewed her chart and materials related to her cancer extensively and collaborated history with the patient. Summary of oncologic history is as follows: Oncology History   No history exists.    ASSESSMENT & PLAN:  Pulmonary emboli Large right pleural effusion, malignant Shortness of breath - Per CT angio chest done 09/12/2023 which shows positive examination for peripheral PE on the left  with no evidence of right heart strain. - Also noted is large right pleural effusion with consolidation and infiltration in the right lung. - Cultures negative for infection although on oral antibiotics. - Continue Eliquis 10 mg p.o. twice daily x 7 days, started 09/16/2023.  Decreased to 5 mg p.o. twice daily. - Monitor closely for bleeding  Adenocarcinoma recurrence, right lung History of right lung cancer Large right pleural effusion - Status post lobectomy in 2000.  Patient has been lost to follow-up with x 2 years. - Status post thoracentesis done 09/13/2023 for which cytology shows adenocarcinoma - Repeat thoracentesis done 09/15/2023 with 1 L of clear red fluid removed. Repeat cytology shows adenocarcinoma.  History of cervical cancer - In 1985  Dementia - Supportive care       MEDICAL HISTORY:  Past Medical History:  Diagnosis Date   Acute asthmatic bronchitis    Adenocarcinoma, lung (HCC)    Allergic rhinitis    Anxiety    Carcinoma in situ of endocrine gland    Colon polyp    COPD (chronic obstructive pulmonary disease) (HCC)    Diverticulosis of colon    History of cervical cancer    Hypercholesterolemia    Microscopic hematuria    Osteoporosis     SURGICAL HISTORY: Past Surgical History:  Procedure Laterality Date   ABDOMINAL HYSTERECTOMY     in her 30's   APPENDECTOMY     IR THORACENTESIS ASP PLEURAL SPACE W/IMG GUIDE  09/15/2023   TOTAL ABDOMINAL HYSTERECTOMY W/ BILATERAL SALPINGOOPHORECTOMY  1985   Dr Elana Alm for Ca of  cervix    SOCIAL HISTORY: Social History   Socioeconomic History   Marital status: Married    Spouse name: Not on file   Number of children: 1   Years of education: Not on file   Highest education level: Not on file  Occupational History   Occupation: Scientific laboratory technician  Tobacco Use   Smoking status: Former    Current packs/day: 0.00    Average packs/day: 2.0 packs/day for 23.0 years (46.0 ttl pk-yrs)    Types: Cigarettes     Start date: 06/24/1962    Quit date: 06/24/1985    Years since quitting: 38.2   Smokeless tobacco: Never  Vaping Use   Vaping status: Never Used  Substance and Sexual Activity   Alcohol use: No    Alcohol/week: 0.0 standard drinks of alcohol   Drug use: No   Sexual activity: Never  Other Topics Concern   Not on file  Social History Narrative   Regular exercise: walkCaffeine use: 3 sodas daily, sweet tea   Right handed   No caffeine   Lives with husband   Social Drivers of Corporate investment banker Strain: Low Risk  (08/02/2020)   Overall Financial Resource Strain (CARDIA)    Difficulty of Paying Living Expenses: Not hard at all  Food Insecurity: Patient Declined (09/13/2023)   Hunger Vital Sign    Worried About Running Out of Food in the Last Year: Patient declined    Ran Out of Food in the Last Year: Patient declined  Transportation Needs: No Transportation Needs (09/13/2023)   PRAPARE - Administrator, Civil Service (Medical): No    Lack of Transportation (Non-Medical): No  Physical Activity: Sufficiently Active (08/02/2020)   Exercise Vital Sign    Days of Exercise per Week: 5 days    Minutes of Exercise per Session: 30 min  Stress: No Stress Concern Present (08/02/2020)   [employer] of Occupational Health - Occupational Stress Questionnaire    Feeling of Stress : Not at all  Social Connections: Unknown (09/14/2023)   Social Connection and Isolation Panel [NHANES]    Frequency of Communication with Friends and Family: Patient unable to answer    Frequency of Social Gatherings with Friends and Family: Patient unable to answer    Attends Religious Services: Patient unable to answer    Active Member of Clubs or Organizations: Patient unable to answer    Attends Banker Meetings: Patient unable to answer    Marital Status: Married  Catering manager Violence: Not At Risk (09/13/2023)   Humiliation, Afraid, Rape, and Kick questionnaire    Fear of  Current or Ex-Partner: No    Emotionally Abused: No    Physically Abused: No    Sexually Abused: No    FAMILY HISTORY: Family History  Problem Relation Age of Onset   Asthma Maternal Aunt    Heart disease Father    Hyperlipidemia Father    Stroke Father    Heart disease Paternal Grandfather    Heart disease Maternal Grandmother    Rheum arthritis Mother    Breast cancer Paternal Grandmother    Prostate cancer Maternal Grandfather    Diabetes Brother     REVIEW OF SYSTEMS:   Constitutional: Denies fevers, chills or abnormal night sweats Eyes: Denies blurriness of vision, double vision or watery eyes Ears, nose, mouth, throat, and face: Denies mucositis or sore throat Respiratory: Denies cough, dyspnea or wheezes Cardiovascular: Denies palpitation, chest discomfort or lower extremity swelling  Gastrointestinal: Denies nausea, heartburn or change in bowel habits Skin: Denies abnormal skin rashes Lymphatics: Denies new lymphadenopathy or easy bruising Neurological: Denies numbness, tingling or new weaknesses Behavioral/Psych: Mood is stable, no new changes  All other systems were reviewed with the patient and are negative.  PHYSICAL EXAMINATION: ECOG PERFORMANCE STATUS: {CHL ONC ECOG ZO:[id number]}  Vitals:   09/17/23 0520 09/17/23 0819  BP: (!) 105/47 (!) 141/50  Pulse:  (!) 46  Resp: 18 18  Temp: 98 F (36.7 C) (!) 97.4 F (36.3 C)  SpO2: 98% 95%   Filed Weights   09/12/23 1737  Weight: 139 lb 15.9 oz (63.5 kg)    GENERAL: alert, no distress and comfortable SKIN: skin color, texture, turgor are normal, no rashes or significant lesions EYES: normal, conjunctiva are pink and non-injected, sclera clear OROPHARYNX: no exudate, no erythema and lips, buccal mucosa, and tongue normal  NECK: supple, thyroid normal size, non-tender, without nodularity LYMPH: no palpable lymphadenopathy in the cervical, axillary or inguinal LUNGS: clear to auscultation and percussion with  normal breathing effort HEART: regular rate & rhythm and no murmurs and no lower extremity edema ABDOMEN: abdomen soft, non-tender and normal bowel sounds MUSCULOSKELETAL: no cyanosis of digits and no clubbing  PSYCH: alert & oriented x 3 with fluent speech NEURO: no focal motor/sensory deficits   ALLERGIES:  is allergic to augmentin [amoxicillin-pot clavulanate] and sulfonamide derivatives.  MEDICATIONS:  Current Facility-Administered Medications  Medication Dose Route Frequency Provider Last Rate Last Admin   acetaminophen (TYLENOL) tablet 1,000 mg  1,000 mg Oral Q8H Rolly Salter, MD   1,000 mg at 09/17/23 8119   apixaban (ELIQUIS) tablet 10 mg  10 mg Oral BID Ishmael Holter, RPH   10 mg at 09/17/23 1478   Followed by   Melene Muller ON 09/23/2023] apixaban (ELIQUIS) tablet 5 mg  5 mg Oral BID Paytes, Austin A, RPH       atorvastatin (LIPITOR) tablet 10 mg  10 mg Oral Daily Rolly Salter, MD   10 mg at 09/17/23 2956   cefadroxil (DURICEF) capsule 1,000 mg  1,000 mg Oral BID Rolly Salter, MD   1,000 mg at 09/17/23 0915   citalopram (CELEXA) tablet 10 mg  10 mg Oral Daily Buena Irish, MD   10 mg at 09/17/23 2130   cyanocobalamin (VITAMIN B12) tablet 1,000 mcg  1,000 mcg Oral Daily Rolly Salter, MD   1,000 mcg at 09/17/23 0915   famotidine (PEPCID) tablet 20 mg  20 mg Oral Daily Buena Irish, MD   20 mg at 09/17/23 0918   fluticasone furoate-vilanterol (BREO ELLIPTA) 100-25 MCG/ACT 1 puff  1 puff Inhalation Daily Buena Irish, MD   1 puff at 09/17/23 0912   And   umeclidinium bromide (INCRUSE ELLIPTA) 62.5 MCG/ACT 1 puff  1 puff Inhalation Daily Buena Irish, MD   1 puff at 09/17/23 0914   lidocaine (LIDODERM) 5 % 1 patch  1 patch Transdermal Q24H Rolly Salter, MD   1 patch at 09/17/23 0913   memantine (NAMENDA) tablet 10 mg  10 mg Oral BID Buena Irish, MD   10 mg at 09/17/23 8657   Oral care mouth rinse  15 mL Mouth Rinse PRN Rolly Salter, MD        oxyCODONE (Oxy IR/ROXICODONE) immediate release tablet 5 mg  5 mg Oral Q4H PRN Rolly Salter, MD   5 mg at 09/16/23 2202     LABORATORY DATA:  I have reviewed  the data as listed Lab Results  Component Value Date   WBC 9.7 09/17/2023   HGB 13.1 09/17/2023   HCT 41.6 09/17/2023   MCV 90.4 09/17/2023   PLT 280 09/17/2023   Recent Labs    12/20/22 0828 12/20/22 0828 09/12/23 1740 09/15/23 0409 09/16/23 0328 09/17/23 0322  NA 141  --  140 138 141 139  K 3.8  --  4.3 3.8 3.9 3.8  CL 105  --  106 108 108 106  CO2 28  --  23 25 26 27   GLUCOSE 93  --  92 77 84 93  BUN 15  --  14 15 13 13   CREATININE 0.95  --  1.08* 0.95 0.94 1.19*  CALCIUM 9.6  --  9.0 8.1* 8.4* 8.7*  GFRNONAA  --    < > 53* >60 >60 47*  PROT 7.2  --  7.2  --   --   --   ALBUMIN 4.1  --  3.4*  --   --   --   AST 25  --  29  --   --   --   ALT 21  --  22  --   --   --   ALKPHOS 89  --  83  --   --   --   BILITOT 0.7  --  0.9  --   --   --   BILIDIR 0.1  --   --   --   --   --    < > = values in this interval not displayed.    RADIOGRAPHIC STUDIES: I have personally reviewed the radiological images as listed and agreed with the findings in the report. IR THORACENTESIS ASP PLEURAL SPACE W/IMG GUIDE Result Date: 09/16/2023 INDICATION: 78 year old female with history of lung cancer, shortness of breath, and right pleural effusion. Pt is s/p right thoracentesis 09/13/23 with fluid remaining, here for diagnostic and therapeutic right thoracentesis. EXAM: ULTRASOUND GUIDED RIGHT THORACENTESIS MEDICATIONS: 10 mL 1% lidocaine COMPLICATIONS: None immediate. PROCEDURE: An ultrasound guided thoracentesis was thoroughly discussed with the patient and questions answered. The benefits, risks, alternatives and complications were also discussed. The patient understands and wishes to proceed with the procedure. Written consent was obtained. Ultrasound was performed to localize and mark an adequate pocket of fluid in the right  chest. The area was then prepped and draped in the normal sterile fashion. 1% Lidocaine was used for local anesthesia. Under ultrasound guidance a 8 Fr Safe-T-Centesis catheter was introduced. Thoracentesis was performed. The catheter was removed and a dressing applied. FINDINGS: A total of approximately 1 liter of clear red fluid was removed. Samples were sent to the laboratory as requested by the clinical team. IMPRESSION: Successful ultrasound guided right thoracentesis yielding 1 liter of pleural fluid. Performed By Theresa Mulligan, PA-C Electronically Signed   By: Irish Lack M.D.   On: 09/16/2023 08:04   DG Chest 1 View Result Date: 09/15/2023 CLINICAL DATA:  Status post right thoracentesis. EXAM: CHEST  1 VIEW COMPARISON:  09/15/2023 FINDINGS: Stable heart size. Significant decrease in right pleural fluid after thoracentesis. No pneumothorax. Stable chronic lung disease. No pulmonary edema. IMPRESSION: Significant decrease in right pleural fluid after thoracentesis. No pneumothorax. Electronically Signed   By: Irish Lack M.D.   On: 09/15/2023 09:45   DG Chest 2 View Result Date: 09/15/2023 CLINICAL DATA:  78 year old female with pleural effusion on the right. Small pulmonary emboli. Ultrasound thoracentesis 09/13/2023. EXAM: CHEST - 2  VIEW COMPARISON:  Portable chest 09/13/2023 and CTA chest 09/12/2023 and earlier. FINDINGS: AP and lateral views 0525 hours. Right pleural fluid volume appears mildly decreased compared to 09/12/2023. Ongoing dense opacification of the right lung base. No pneumothorax. Stable cardiac size and mediastinal contours. Stable lung markings elsewhere. No new pulmonary opacity. Stable visualized osseous structures. Negative visible bowel gas. IMPRESSION: Mildly decreased right pleural fluid following thoracentesis on 09/13/2022. Ongoing dense right lung base opacification. No pneumothorax or new cardiopulmonary abnormality. Electronically Signed   By: Odessa Fleming M.D.   On:  09/15/2023 06:54   DG Chest 1 View Result Date: 09/13/2023 CLINICAL DATA:  Right thoracentesis.  Pleural effusion. EXAM: CHEST  1 VIEW COMPARISON:  09/12/2023 FINDINGS: Reduced size of the right pleural effusion although still with moderate to large remaining effusion. No pneumothorax or complicating feature. Atherosclerotic calcification of the aortic arch. Emphysema. Indistinct nodularity at the left lung apex appears stable. IMPRESSION: 1. Reduced size of the right pleural effusion although still with moderate to large remaining effusion. No pneumothorax or complicating feature. 2. Aortic Atherosclerosis (ICD10-I70.0) and Emphysema (ICD10-J43.9). 3. Stable indistinct nodularity at the left lung apex. Electronically Signed   By: Gaylyn Rong M.D.   On: 09/13/2023 12:15   US THORACENTESIS ASP PLEURAL SPACE W/IMG GUIDE Result Date: 09/13/2023 INDICATION: 78 year old female with history of lung cancer, shortness of breath. Found to have right pleural effusion. EXAM: ULTRASOUND GUIDED RIGHT THORACENTESIS MEDICATIONS: 10 mL 1% lidocaine COMPLICATIONS: None immediate. PROCEDURE: An ultrasound guided thoracentesis was thoroughly discussed with the patient and questions answered. The benefits, risks, alternatives and complications were also discussed. The patient understands and wishes to proceed with the procedure. Written consent was obtained. Ultrasound was performed to localize and mark an adequate pocket of fluid in the right chest. The area was then prepped and draped in the normal sterile fashion. 1% Lidocaine was used for local anesthesia. Under ultrasound guidance a 6 Fr Safe-T-Centesis catheter was introduced. Thoracentesis was performed. The catheter was removed and a dressing applied. FINDINGS: A total of approximately 1.0 liters of amber fluid was removed. Samples were sent to the laboratory as requested by the clinical team. IMPRESSION: Successful ultrasound guided right thoracentesis yielding  1.0 liters of pleural fluid. Performed by: Loyce Dys PA-C Electronically Signed   By: Malachy Moan M.D.   On: 09/13/2023 12:09   CT Angio Chest PE W/Cm &/Or Wo Cm Result Date: 09/12/2023 CLINICAL DATA:  Pulmonary embolus suspected with low to intermediate probability. Positive D-dimer. Chest pain, cough, and shortness of breath. EXAM: CT ANGIOGRAPHY CHEST WITH CONTRAST TECHNIQUE: Multidetector CT imaging of the chest was performed using the standard protocol during bolus administration of intravenous contrast. Multiplanar CT image reconstructions and MIPs were obtained to evaluate the vascular anatomy. RADIATION DOSE REDUCTION: This exam was performed according to the departmental dose-optimization program which includes automated exposure control, adjustment of the mA and/or kV according to patient size and/or use of iterative reconstruction technique. CONTRAST:  75mL OMNIPAQUE IOHEXOL 350 MG/ML SOLN COMPARISON:  Chest radiograph 09/12/2023.  CT chest 03/14/2022 FINDINGS: Cardiovascular: Technically adequate study with good opacification of the central and segmental pulmonary arteries. Mild motion artifact. Focal filling defects are demonstrated in distal segmental branches to the left upper lung consistent with pulmonary emboli. Low clot burden. The RV to LV ratio is 0.8 suggesting no evidence of right heart strain. Minimal pericardial effusion. Normal caliber thoracic aorta. Calcification of the aorta and coronary arteries. Mediastinum/Nodes: Subcentimeter left thyroid gland nodule.  No imaging follow-up is indicated. Prominent lymph nodes in the right cardiophrenic angle measuring up to 1.1 cm short axis dimension. These are likely reactive but nonspecific. Esophagus is decompressed. Lungs/Pleura: Motion artifact limits evaluation. There is a large right pleural effusion with consolidation in the right lower lung and patchy infiltration throughout the right lung likely representing pneumonia.  Compressive atelectasis could also have this appearance. There is focal narrowing of the right lower lobe bronchus which could be inflammatory but could also represent an endobronchial lesion. Suggest short-term follow-up to exclude underlying lesion. Could also consider bronchoscopy. Bronchial wall thickening with some fluid or secretions in the right lower lobe bronchi. This could indicate aspiration or mucous plugging. Three focal spiculated lesions in the left upper lung. Largest measures 1.1 x 1.7 cm. Series 6, image 28. The lesions appear similar to previous study. Stability since 2023 suggests benign lesion, likely scarring. Emphysematous changes in the lungs. Upper Abdomen: No acute process identified. Musculoskeletal: Degenerative changes in the spine. No focal bone lesions. Review of the MIP images confirms the above findings. IMPRESSION: 1. Positive examination for peripheral pulmonary emboli on the left. No evidence of right heart strain. 2. Large right pleural effusion with consolidation and infiltration in the right lung, possibly pneumonia or postobstructive change. 3. Right lower lung bronchial wall thickening, possibly inflammatory or neoplastic. Mucous plugging versus secretions or aspiration in the right lower lung bronchi. 4. Nodular spiculation in the left upper lung is unchanged since prior studies, likely areas of fibrosis related to treated lung cancer. 5. Emphysematous changes in the lungs. 6. Aortic atherosclerosis. 7. Prominent lymph nodes in the right cardiophrenic angle of nonspecific etiology. Critical Value/emergent results were called by telephone at the time of interpretation on 09/12/2023 at 10:34 pm to provider Dr. Particia Nearing, who verbally acknowledged these results. Electronically Signed   By: Burman Nieves M.D.   On: 09/12/2023 22:43   DG Chest Port 1 View Result Date: 09/12/2023 CLINICAL DATA:  Shortness of breath.  Chest pain. EXAM: PORTABLE CHEST 1 VIEW COMPARISON:   12/20/2022 FINDINGS: New finding of opacity in the right lung base likely representing a moderate-sized pleural effusion with basilar atelectasis or consolidation. This could represent compressive atelectasis or pneumonia. The left lung is clear. No pneumothorax. Mediastinal contours appear intact. Normal heart size. Calcification of the aorta. Degenerative changes in the shoulders. IMPRESSION: Moderate right pleural effusion with consolidation or atelectasis in the right lung base. Electronically Signed   By: Burman Nieves M.D.   On: 09/12/2023 19:32     The total time spent in the appointment was {CHL ONC TIME VISIT - [id number]} encounter with patients including review of chart and various tests results, discussions about plan of care and coordination of care plan   All questions were answered. The patient knows to call the clinic with any problems, questions or concerns. No barriers to learning was detected.  Dawson Bills, NP 3/26/20251:31 PM

## 2023-09-17 NOTE — Progress Notes (Signed)
 SATURATION QUALIFICATIONS: (This note is used to comply with regulatory documentation for home oxygen)  Patient Saturations on Room Air at Rest = 82%  Patient Saturations on Room Air while Ambulating = NT as pt desaturates on RA at rest  Patient Saturations on 3 Liters of oxygen while Ambulating = 91%  Please briefly explain why patient needs home oxygen:Pt requiring 3LO2 to keep sats >90%.  Osman Calzadilla M,PT Acute Rehab Services [phone]

## 2023-09-17 NOTE — Progress Notes (Signed)
       [facility] Progress Note Patient: Pamela Terry UJW:[id number] DOB: 26-Dec-1945 DOA: 09/12/2023  DOS: the patient was seen and examined on 09/17/2023  Brief Hospital Course: PMH of right lung adenocarcinoma, lost to follow-up, COPD, dementia, HTN, depression, cervical cancer. Presented to hospital with complaints of cough shortness of breath as well as right-sided chest pain. Workup showed small left PE, large right effusion as well as possible pneumonia with right endobronchial lesion. IR and pulmonology were consulted. Underwent thoracentesis X 2 with 2 L total removed so far. Cytology positive for adenocarcinoma.  Oncology consulted, discussed extensively with family members, agreeable for home hospice due to history of dementia with stage IV adenocarcinoma of the lung.  Subjective Patient still reporting some shortness of breath, otherwise denies any new complaints.  Discussed extensively with husband at bedside    Assessment and Plan: Malignant pleural effusion on right Likely recurrent adenocarcinoma of the right lung, likely stage IV Presents with progressively worsening shortness of breath Chest x-ray and CT scan shows evidence of large pleural effusion Underwent thoracentesis x 2 with 1 L fluid removed each time Cultures negative for any infection, cytology came back positive for adenocarcinoma Pulmonary consulted, appreciate recs Oncology consulted, discussed extensively with family members, agreeable for home hospice due to history of dementia with stage IV adenocarcinoma of the lung TOC consulted for possible home hospice arrangement   Bacterial pneumonia CT scan shows evidence of possible pneumonia Currently afebrile, with no leukocytosis COVID-negative Continue oral antibiotics  Small acute left PE Continue Eliquis Continue with supplemental oxygen  Acute hypoxic respiratory failure Likely multifactorial from pleural effusion, lung CA, pneumonia,  PE Requiring about 2 L of O2 Continue supplemental O2, attempt to wean as able   Dementia Monitor.  At risk for delirium.   HLD Continue statin.   Mood disorder Continuing home regimen.   COPD Currently no evidence of exacerbation.  Continue inhalers     Physical Exam: General: NAD, alert, awake, not oriented Cardiovascular: S1, S2 present Respiratory: Diminished breath sounds on the right Abdomen: Soft, nontender, nondistended, bowel sounds present Musculoskeletal: No bilateral pedal edema noted Skin: Normal Psychiatry: Unable to assess     Disposition: Status is: Inpatient Remains inpatient appropriate because: Level of care  apixaban (ELIQUIS) tablet 10 mg  apixaban (ELIQUIS) tablet 5 mg     Family Communication: Discussed with husband at bedside Level of care: Telemetry Medical     Vitals:   09/17/23 0520 09/17/23 0520 09/17/23 0819 09/17/23 1728  BP: (!) 105/47 (!) 105/47 (!) 141/50 (!) 150/61  Pulse: (!) 42  (!) 46 (!) 54  Resp:  18 18 18   Temp: 98 F (36.7 C) 98 F (36.7 C) (!) 97.4 F (36.3 C)   TempSrc:      SpO2: 98% 98% 95% 95%  Weight:      Height:         Author: Briant Cedar, MD 09/17/2023 6:11 PM  Please look on [email or web address] to find out who is on call.

## 2023-09-18 ENCOUNTER — Other Ambulatory Visit: Payer: Self-pay | Admitting: Internal Medicine

## 2023-09-18 DIAGNOSIS — I2699 Other pulmonary embolism without acute cor pulmonale: Secondary | ICD-10-CM | POA: Diagnosis not present

## 2023-09-18 MED ORDER — OXYCODONE HCL 5 MG PO TABS: 5.0000 mg | ORAL_TABLET | Freq: Four times a day (QID) | ORAL | 0 refills | Status: AC | PRN

## 2023-09-18 MED ORDER — LIDOCAINE 5 % EX PTCH: 1.0000 | MEDICATED_PATCH | CUTANEOUS | 0 refills | Status: DC

## 2023-09-18 MED ORDER — APIXABAN (ELIQUIS) VTE STARTER PACK (10MG AND 5MG): ORAL_TABLET | ORAL | 0 refills | Status: DC

## 2023-09-18 MED ORDER — FAMOTIDINE 20 MG PO TABS: 20.0000 mg | ORAL_TABLET | Freq: Every day | ORAL | 1 refills | Status: DC

## 2023-09-18 NOTE — TOC Transition Note (Addendum)
 Transition of Care [facility]) - Discharge Note   Patient Details  Name: Pamela Terry MRN: [id number] Date of Birth: Jan 15, 1946  Transition of Care [facility]) CM/SW Contact:  Tom-Johnson, Hershal Coria, RN Phone Number: 09/18/2023, 10:25 AM   Clinical Narrative:     Patient is scheduled for discharge today.  Home Hospice recommended, CM spoke with patient, husband Pamela Terry and daughter Pamela Terry at bedside. Choices from Medicare.gov given and [facility]. CM called in referral to [facility] with acceptance voiced, info on AVS. Outpatient f/u, hospital f/u and discharge instructions on AVS. Home O2 ordered from [employer] and [employer] to deliver portable tank to patient at bedside prior discharge. Husband, Pamela Terry and daughter, Pamela Terry to transport at discharge.  No further TOC needs noted.        Final next level of care: Home w Hospice Care Barriers to Discharge: Barriers Resolved   Patient Goals and CMS Choice Patient states their goals for this hospitalization and ongoing recovery are:: To return home with Hospice CMS Medicare.gov Compare Post Acute Care list provided to:: Patient Choice offered to / list presented to : Patient, Spouse, Adult Children (Daughter, Pamela Terry)      Discharge Placement                Patient to be transferred to facility by: Husband and daughter Name of family member notified: Pamela Terry and [facility]    Discharge Plan and Services Additional resources added to the After Visit Summary for                  DME Arranged: N/A (Declined Youth walker, has one at home.) DME Agency: NA       HH Arranged: PT, OT HH Agency: [facility] Date HH Agency Contacted: 09/18/23 Time HH Agency Contacted: 1020 Representative spoke with at [facility] Agency: Cheri  Social Drivers of Health (SDOH) Interventions SDOH Screenings   Food Insecurity: Patient Declined (09/13/2023)  Housing: Low Risk  (09/14/2023)  Transportation Needs: No Transportation  Needs (09/13/2023)  Utilities: Not At Risk (09/13/2023)  Alcohol Screen: Low Risk  (08/02/2020)  Depression (PHQ2-9): Low Risk  (12/19/2022)  Financial Resource Strain: Low Risk  (08/02/2020)  Physical Activity: Sufficiently Active (08/02/2020)  Social Connections: Unknown (09/14/2023)  Stress: No Stress Concern Present (08/02/2020)  Tobacco Use: Medium Risk (09/12/2023)     Readmission Risk Interventions    09/15/2023   11:19 AM  Readmission Risk Prevention Plan  Post Dischage Appt Complete  Medication Screening Complete  Transportation Screening Complete

## 2023-09-18 NOTE — Progress Notes (Signed)
   09/18/23 1140  Spiritual Encounters  Type of Visit Initial  Care provided to: Pt and family  Reason for visit Routine spiritual support  OnCall Visit No   Chaplain met with Pt and her family (daughter and husband) and provided spiritual care. Pt is being discharged today. No further spiritual need at this time.  Chaplain Daron Offer

## 2023-09-18 NOTE — Discharge Summary (Addendum)
 Physician Discharge Summary   Patient: Pamela Terry MRN: [id number] DOB: [birth year]  Admit date:     09/12/2023  Discharge date: 09/18/23  Discharge Physician: Briant Cedar   PCP: Corwin Levins, MD   Recommendations at discharge:    Home with hospice services Follow up with PCP Follow up with pulmonary/oncology prn  Discharge Diagnoses: Principal Problem:   Pulmonary embolism [facility]) Active Problems:   Pleural effusion on right   Pneumonia   Malignant neoplasm of bronchus and lung (HCC)   Malignant neoplasm of cervix uteri (HCC)   COPD (chronic obstructive pulmonary disease) with chronic bronchitis (HCC)   Osteoporosis   Bilateral hearing loss    Hospital Course: PMH of right lung adenocarcinoma, lost to follow-up, COPD, dementia, HTN, depression, cervical cancer. Presented to hospital with complaints of cough shortness of breath as well as right-sided chest pain. Workup showed small left PE, large right effusion as well as possible pneumonia with right endobronchial lesion. IR and pulmonology were consulted. Underwent thoracentesis X 2 with 2 L total removed so far. Cytology positive for adenocarcinoma.  Oncology consulted, discussed extensively with family members, agreeable for home hospice due to history of dementia with stage IV adenocarcinoma of the lung.  Assessment and Plan:  Malignant pleural effusion on right Likely recurrent adenocarcinoma of the right lung, likely stage IV Presents with progressively worsening shortness of breath Chest x-ray and CT scan shows evidence of large pleural effusion Underwent thoracentesis x 2 with 1 L fluid removed each time Cultures negative for any infection, cytology came back positive for adenocarcinoma Pulmonary consulted, appreciate recs Oncology consulted, discussed extensively with family members, agreeable for home hospice due to history of dementia with stage IV adenocarcinoma of the lung TOC consulted for home  hospice arrangement   Possible Bacterial pneumonia CT scan shows evidence of possible pneumonia Currently afebrile, with no leukocytosis COVID-negative Completed oral antibiotics   Small acute left PE Continue Eliquis Continue with supplemental oxygen   Acute hypoxic respiratory failure Likely multifactorial from pleural effusion, lung CA, pneumonia, PE Requiring about 2 L of O2 Continue supplemental O2, home O2 arranged   Dementia Monitor.  At risk for delirium.   HLD Continue statin.   Mood disorder Continuing home regimen.   COPD Currently no evidence of exacerbation.  Continue inhalers     SATURATION QUALIFICATIONS: (This note is used to comply with regulatory documentation for home oxygen)   Patient Saturations on Room Air at Rest = 82%   Patient Saturations on Room Air while Ambulating = NT as pt desaturates on RA at rest   Patient Saturations on 3 Liters of oxygen while Ambulating = 91%   Please briefly explain why patient needs home oxygen:Pt requiring 3LO2 to keep sats >90%.       Pain control - [employer] Controlled Substance Reporting System database was reviewed. and patient was instructed, not to drive, operate heavy machinery, perform activities at heights, swimming or participation in water activities or provide baby-sitting services while on Pain, Sleep and Anxiety Medications; until their outpatient Physician has advised to do so again. Also recommended to not to take more than prescribed Pain, Sleep and Anxiety Medications.    Consultants: Oncology, pulmonary, IR Procedures performed: Thoracentesis Disposition: Home with hospice Diet recommendation:  Regular diet    DISCHARGE MEDICATION: Allergies as of 09/18/2023       Reactions   Augmentin [amoxicillin-pot Clavulanate]    Severe diarrhea   Sulfonamide Derivatives Hives, Rash  Medication List     STOP taking these medications    aspirin 81 MG chewable tablet        TAKE these medications    acetaminophen 325 MG tablet Commonly known as: Tylenol Take 2 tablets (650 mg total) by mouth every 6 (six) hours as needed for moderate pain.   alendronate 70 MG tablet Commonly known as: FOSAMAX TAKE 1 TABLET BY MOUTH ONCE A WEEK. TAKE WITH A FULL GLASS OF WATER ON AN EMPTY STOMACH.   Apixaban Starter Pack (10mg  and 5mg ) Commonly known as: ELIQUIS STARTER PACK Take as directed on package: start with two-5mg  tablets twice daily for 7 days. On day 8, switch to one-5mg  tablet twice daily.   atorvastatin 10 MG tablet Commonly known as: LIPITOR TAKE 1 TABLET BY MOUTH EVERY DAY   CALTRATE BONE HEALTH PO Take by mouth.   citalopram 10 MG tablet Commonly known as: CELEXA TAKE 1 TABLET BY MOUTH EVERY DAY   cyanocobalamin 1000 MCG tablet Commonly known as: VITAMIN B12 Take 1 tablet (1,000 mcg total) by mouth daily.   famotidine 20 MG tablet Commonly known as: PEPCID TAKE 1 TABLET BY MOUTH EVERY DAY   lidocaine 5 % Commonly known as: LIDODERM Place 1 patch onto the skin daily. Remove & Discard patch within 12 hours or as directed by MD Start taking on: September 19, 2023   memantine 10 MG tablet Commonly known as: NAMENDA Take 1 tablet  10 mg 2 times a day   oxyCODONE 5 MG immediate release tablet Commonly known as: Oxy IR/ROXICODONE Take 1 tablet (5 mg total) by mouth every 6 (six) hours as needed for up to 5 days for moderate pain (pain score 4-6), severe pain (pain score 7-10) or breakthrough pain.   Trelegy Ellipta 100-62.5-25 MCG/ACT Aepb Generic drug: Fluticasone-Umeclidin-Vilant Inhale 1 puff into the lungs daily.   Vitamin D 50 MCG (2000 UT) Caps Take 1 capsule by mouth daily.               Durable Medical Equipment  (From admission, onward)           Start     Ordered   09/16/23 0919  For home use only DME oxygen  Once       Question Answer Comment  Length of Need Lifetime   Mode or (Route) Nasal cannula   Liters per  Minute 3   Frequency Continuous (stationary and portable oxygen unit needed)   Oxygen delivery system Gas      09/16/23 0918            Follow-up Information     Corwin Levins, MD. Schedule an appointment as soon as possible for a visit in 1 week(s).   Specialties: Internal Medicine, Radiology Why: with Repeat Chest X ray in 1 week Contact information: [address] [place] [address] [phone]         [facility] at [facility]. Schedule an appointment as soon as possible for a visit in 2 week(s).   Specialty: Pulmonology Contact information: [address] [place] [address] [phone] Additional information: [address]  [address]  [place], [place] [address]        [facility] Follow up.   Specialty: Home Health Services Why: Someone will call you to schedule first home visit. Contact information: [address] [place] [address] [phone]  Discharge Exam: Filed Weights   09/12/23 1737  Weight: 63.5 kg   General: NAD  Cardiovascular: S1, S2 present Respiratory: CTAB Abdomen: Soft, nontender, nondistended, bowel sounds present Musculoskeletal: No bilateral pedal edema noted Skin: Normal Psychiatry: Normal mood   Condition at discharge: fair  The results of significant diagnostics from this hospitalization (including imaging, microbiology, ancillary and laboratory) are listed below for reference.   Imaging Studies: DG CHEST PORT 1 VIEW Result Date: 09/17/2023 CLINICAL DATA:  Pleural effusion. EXAM: PORTABLE CHEST 1 VIEW COMPARISON:  September 15, 2023. FINDINGS: The heart size and mediastinal contours are within normal limits. Left lung is clear. Increased right basilar opacity is noted consistent with atelectasis or pneumonia and growing effusion. The visualized skeletal structures are unremarkable. IMPRESSION: Increased right basilar opacity as noted  above. Electronically Signed   By: Lupita Raider M.D.   On: 09/17/2023 14:07   IR THORACENTESIS ASP PLEURAL SPACE W/IMG GUIDE Result Date: 09/16/2023 INDICATION: 78 year old female with history of lung cancer, shortness of breath, and right pleural effusion. Pt is s/p right thoracentesis 09/13/23 with fluid remaining, here for diagnostic and therapeutic right thoracentesis. EXAM: ULTRASOUND GUIDED RIGHT THORACENTESIS MEDICATIONS: 10 mL 1% lidocaine COMPLICATIONS: None immediate. PROCEDURE: An ultrasound guided thoracentesis was thoroughly discussed with the patient and questions answered. The benefits, risks, alternatives and complications were also discussed. The patient understands and wishes to proceed with the procedure. Written consent was obtained. Ultrasound was performed to localize and mark an adequate pocket of fluid in the right chest. The area was then prepped and draped in the normal sterile fashion. 1% Lidocaine was used for local anesthesia. Under ultrasound guidance a 8 Fr Safe-T-Centesis catheter was introduced. Thoracentesis was performed. The catheter was removed and a dressing applied. FINDINGS: A total of approximately 1 liter of clear red fluid was removed. Samples were sent to the laboratory as requested by the clinical team. IMPRESSION: Successful ultrasound guided right thoracentesis yielding 1 liter of pleural fluid. Performed By Theresa Mulligan, PA-C Electronically Signed   By: Irish Lack M.D.   On: 09/16/2023 08:04   DG Chest 1 View Result Date: 09/15/2023 CLINICAL DATA:  Status post right thoracentesis. EXAM: CHEST  1 VIEW COMPARISON:  09/15/2023 FINDINGS: Stable heart size. Significant decrease in right pleural fluid after thoracentesis. No pneumothorax. Stable chronic lung disease. No pulmonary edema. IMPRESSION: Significant decrease in right pleural fluid after thoracentesis. No pneumothorax. Electronically Signed   By: Irish Lack M.D.   On: 09/15/2023 09:45   DG  Chest 2 View Result Date: 09/15/2023 CLINICAL DATA:  78 year old female with pleural effusion on the right. Small pulmonary emboli. Ultrasound thoracentesis 09/13/2023. EXAM: CHEST - 2 VIEW COMPARISON:  Portable chest 09/13/2023 and CTA chest 09/12/2023 and earlier. FINDINGS: AP and lateral views 0525 hours. Right pleural fluid volume appears mildly decreased compared to 09/12/2023. Ongoing dense opacification of the right lung base. No pneumothorax. Stable cardiac size and mediastinal contours. Stable lung markings elsewhere. No new pulmonary opacity. Stable visualized osseous structures. Negative visible bowel gas. IMPRESSION: Mildly decreased right pleural fluid following thoracentesis on 09/13/2022. Ongoing dense right lung base opacification. No pneumothorax or new cardiopulmonary abnormality. Electronically Signed   By: Odessa Fleming M.D.   On: 09/15/2023 06:54   DG Chest 1 View Result Date: 09/13/2023 CLINICAL DATA:  Right thoracentesis.  Pleural effusion. EXAM: CHEST  1 VIEW COMPARISON:  09/12/2023 FINDINGS: Reduced size of the right pleural effusion although still with moderate to large remaining effusion. No  pneumothorax or complicating feature. Atherosclerotic calcification of the aortic arch. Emphysema. Indistinct nodularity at the left lung apex appears stable. IMPRESSION: 1. Reduced size of the right pleural effusion although still with moderate to large remaining effusion. No pneumothorax or complicating feature. 2. Aortic Atherosclerosis (ICD10-I70.0) and Emphysema (ICD10-J43.9). 3. Stable indistinct nodularity at the left lung apex. Electronically Signed   By: Gaylyn Rong M.D.   On: 09/13/2023 12:15   US THORACENTESIS ASP PLEURAL SPACE W/IMG GUIDE Result Date: 09/13/2023 INDICATION: 78 year old female with history of lung cancer, shortness of breath. Found to have right pleural effusion. EXAM: ULTRASOUND GUIDED RIGHT THORACENTESIS MEDICATIONS: 10 mL 1% lidocaine COMPLICATIONS: None  immediate. PROCEDURE: An ultrasound guided thoracentesis was thoroughly discussed with the patient and questions answered. The benefits, risks, alternatives and complications were also discussed. The patient understands and wishes to proceed with the procedure. Written consent was obtained. Ultrasound was performed to localize and mark an adequate pocket of fluid in the right chest. The area was then prepped and draped in the normal sterile fashion. 1% Lidocaine was used for local anesthesia. Under ultrasound guidance a 6 Fr Safe-T-Centesis catheter was introduced. Thoracentesis was performed. The catheter was removed and a dressing applied. FINDINGS: A total of approximately 1.0 liters of amber fluid was removed. Samples were sent to the laboratory as requested by the clinical team. IMPRESSION: Successful ultrasound guided right thoracentesis yielding 1.0 liters of pleural fluid. Performed by: Loyce Dys PA-C Electronically Signed   By: Malachy Moan M.D.   On: 09/13/2023 12:09   CT Angio Chest PE W/Cm &/Or Wo Cm Result Date: 09/12/2023 CLINICAL DATA:  Pulmonary embolus suspected with low to intermediate probability. Positive D-dimer. Chest pain, cough, and shortness of breath. EXAM: CT ANGIOGRAPHY CHEST WITH CONTRAST TECHNIQUE: Multidetector CT imaging of the chest was performed using the standard protocol during bolus administration of intravenous contrast. Multiplanar CT image reconstructions and MIPs were obtained to evaluate the vascular anatomy. RADIATION DOSE REDUCTION: This exam was performed according to the departmental dose-optimization program which includes automated exposure control, adjustment of the mA and/or kV according to patient size and/or use of iterative reconstruction technique. CONTRAST:  75mL OMNIPAQUE IOHEXOL 350 MG/ML SOLN COMPARISON:  Chest radiograph 09/12/2023.  CT chest 03/14/2022 FINDINGS: Cardiovascular: Technically adequate study with good opacification of the central  and segmental pulmonary arteries. Mild motion artifact. Focal filling defects are demonstrated in distal segmental branches to the left upper lung consistent with pulmonary emboli. Low clot burden. The RV to LV ratio is 0.8 suggesting no evidence of right heart strain. Minimal pericardial effusion. Normal caliber thoracic aorta. Calcification of the aorta and coronary arteries. Mediastinum/Nodes: Subcentimeter left thyroid gland nodule. No imaging follow-up is indicated. Prominent lymph nodes in the right cardiophrenic angle measuring up to 1.1 cm short axis dimension. These are likely reactive but nonspecific. Esophagus is decompressed. Lungs/Pleura: Motion artifact limits evaluation. There is a large right pleural effusion with consolidation in the right lower lung and patchy infiltration throughout the right lung likely representing pneumonia. Compressive atelectasis could also have this appearance. There is focal narrowing of the right lower lobe bronchus which could be inflammatory but could also represent an endobronchial lesion. Suggest short-term follow-up to exclude underlying lesion. Could also consider bronchoscopy. Bronchial wall thickening with some fluid or secretions in the right lower lobe bronchi. This could indicate aspiration or mucous plugging. Three focal spiculated lesions in the left upper lung. Largest measures 1.1 x 1.7 cm. Series 6, image 28. The  lesions appear similar to previous study. Stability since 2023 suggests benign lesion, likely scarring. Emphysematous changes in the lungs. Upper Abdomen: No acute process identified. Musculoskeletal: Degenerative changes in the spine. No focal bone lesions. Review of the MIP images confirms the above findings. IMPRESSION: 1. Positive examination for peripheral pulmonary emboli on the left. No evidence of right heart strain. 2. Large right pleural effusion with consolidation and infiltration in the right lung, possibly pneumonia or postobstructive  change. 3. Right lower lung bronchial wall thickening, possibly inflammatory or neoplastic. Mucous plugging versus secretions or aspiration in the right lower lung bronchi. 4. Nodular spiculation in the left upper lung is unchanged since prior studies, likely areas of fibrosis related to treated lung cancer. 5. Emphysematous changes in the lungs. 6. Aortic atherosclerosis. 7. Prominent lymph nodes in the right cardiophrenic angle of nonspecific etiology. Critical Value/emergent results were called by telephone at the time of interpretation on 09/12/2023 at 10:34 pm to provider Dr. Particia Nearing, who verbally acknowledged these results. Electronically Signed   By: Burman Nieves M.D.   On: 09/12/2023 22:43   DG Chest Port 1 View Result Date: 09/12/2023 CLINICAL DATA:  Shortness of breath.  Chest pain. EXAM: PORTABLE CHEST 1 VIEW COMPARISON:  12/20/2022 FINDINGS: New finding of opacity in the right lung base likely representing a moderate-sized pleural effusion with basilar atelectasis or consolidation. This could represent compressive atelectasis or pneumonia. The left lung is clear. No pneumothorax. Mediastinal contours appear intact. Normal heart size. Calcification of the aorta. Degenerative changes in the shoulders. IMPRESSION: Moderate right pleural effusion with consolidation or atelectasis in the right lung base. Electronically Signed   By: Burman Nieves M.D.   On: 09/12/2023 19:32    Microbiology: Results for orders placed or performed during the hospital encounter of 09/12/23  Resp panel by RT-PCR (RSV, Flu A&B, Covid) Anterior Nasal Swab     Status: None   Collection Time: 09/12/23  5:40 PM   Specimen: Anterior Nasal Swab  Result Value Ref Range Status   SARS Coronavirus 2 by RT PCR NEGATIVE NEGATIVE Final   Influenza A by PCR NEGATIVE NEGATIVE Final   Influenza B by PCR NEGATIVE NEGATIVE Final    Comment: (NOTE) The Xpert Xpress SARS-CoV-2/FLU/RSV plus assay is intended as an aid in the  diagnosis of influenza from Nasopharyngeal swab specimens and should not be used as a sole basis for treatment. Nasal washings and aspirates are unacceptable for Xpert Xpress SARS-CoV-2/FLU/RSV testing.  Fact Sheet for Patients: BloggerCourse.com  Fact Sheet for Healthcare Providers: SeriousBroker.it  This test is not yet approved or cleared by the [place] FDA and has been authorized for detection and/or diagnosis of SARS-CoV-2 by FDA under an Emergency Use Authorization (EUA). This EUA will remain in effect (meaning this test can be used) for the duration of the COVID-19 declaration under Section 564(b)(1) of the Act, 21 U.S.C. section 360bbb-3(b)(1), unless the authorization is terminated or revoked.     Resp Syncytial Virus by PCR NEGATIVE NEGATIVE Final    Comment: (NOTE) Fact Sheet for Patients: BloggerCourse.com  Fact Sheet for Healthcare Providers: SeriousBroker.it  This test is not yet approved or cleared by the [place] FDA and has been authorized for detection and/or diagnosis of SARS-CoV-2 by FDA under an Emergency Use Authorization (EUA). This EUA will remain in effect (meaning this test can be used) for the duration of the COVID-19 declaration under Section 564(b)(1) of the Act, 21 U.S.C. section 360bbb-3(b)(1), unless the authorization is terminated or  revoked.  Performed at [facility], [address], [place], [place] [address]   Body fluid culture w Gram Stain     Status: None   Collection Time: 09/13/23 12:10 PM   Specimen: Lung, Right; Pleural Fluid  Result Value Ref Range Status   Specimen Description PLEURAL  Final   Special Requests right lung  Final   Gram Stain   Final    WBC PRESENT,BOTH PMN AND MONONUCLEAR NO ORGANISMS SEEN CYTOSPIN SMEAR    Culture   Final    NO GROWTH 3 DAYS Performed at [facility], [address] [place], [place] [address]    Report Status 09/16/2023 FINAL  Final    Labs: CBC: Recent Labs  Lab 09/12/23 1740 09/14/23 0616 09/15/23 0409 09/16/23 0328 09/17/23 0322  WBC 13.1* 11.0* 10.4 9.4 9.7  HGB 15.7* 13.2 13.0 13.1 13.1  HCT 49.3* 41.4 40.6 41.0 41.6  MCV 90.0 89.4 89.4 89.3 90.4  PLT 330 290 289 276 280   Basic Metabolic Panel: Recent Labs  Lab 09/12/23 1740 09/15/23 0409 09/16/23 0328 09/17/23 0322  NA 140 138 141 139  K 4.3 3.8 3.9 3.8  CL 106 108 108 106  CO2 23 25 26 27   GLUCOSE 92 77 84 93  BUN 14 15 13 13   CREATININE 1.08* 0.95 0.94 1.19*  CALCIUM 9.0 8.1* 8.4* 8.7*  MG  --  2.3 2.1 2.2   Liver Function Tests: Recent Labs  Lab 09/12/23 1740  AST 29  ALT 22  ALKPHOS 83  BILITOT 0.9  PROT 7.2  ALBUMIN 3.4*   CBG: No results for input(s): "GLUCAP" in the last 168 hours.  Discharge time spent: greater than 30 minutes.  Signed: Briant Cedar, MD [facility] 09/18/2023

## 2023-09-18 NOTE — Plan of Care (Signed)
  Problem: Health Behavior/Discharge Planning: Goal: Ability to manage health-related needs will improve 09/18/2023 1026 by Amado Coe, RN Outcome: Adequate for Discharge 09/18/2023 1026 by Amado Coe, RN Outcome: Progressing   Problem: Clinical Measurements: Goal: Will remain free from infection 09/18/2023 1026 by Amado Coe, RN Outcome: Adequate for Discharge 09/18/2023 1026 by Amado Coe, RN Outcome: Progressing Goal: Diagnostic test results will improve 09/18/2023 1026 by Amado Coe, RN Outcome: Adequate for Discharge 09/18/2023 1026 by Amado Coe, RN Outcome: Progressing   Problem: Nutrition: Goal: Adequate nutrition will be maintained 09/18/2023 1026 by Amado Coe, RN Outcome: Adequate for Discharge 09/18/2023 1026 by Amado Coe, RN Outcome: Progressing   Problem: Coping: Goal: Level of anxiety will decrease 09/18/2023 1026 by Amado Coe, RN Outcome: Adequate for Discharge 09/18/2023 1026 by Amado Coe, RN Outcome: Progressing   Problem: Elimination: Goal: Will not experience complications related to bowel motility 09/18/2023 1026 by Amado Coe, RN Outcome: Adequate for Discharge 09/18/2023 1026 by Amado Coe, RN Outcome: Progressing Goal: Will not experience complications related to urinary retention 09/18/2023 1026 by Amado Coe, RN Outcome: Adequate for Discharge 09/18/2023 1026 by Amado Coe, RN Outcome: Progressing   Problem: Pain Managment: Goal: General experience of comfort will improve and/or be controlled 09/18/2023 1026 by Amado Coe, RN Outcome: Adequate for Discharge 09/18/2023 1026 by Amado Coe, RN Outcome: Progressing   Problem: Safety: Goal: Ability to remain free from injury will improve 09/18/2023 1026 by Amado Coe, RN Outcome: Adequate for Discharge 09/18/2023 1026 by Amado Coe, RN Outcome: Progressing   Problem: Skin Integrity: Goal: Risk for impaired skin  integrity will decrease 09/18/2023 1026 by Amado Coe, RN Outcome: Adequate for Discharge 09/18/2023 1026 by Amado Coe, RN Outcome: Progressing   Problem: Acute Rehab PT Goals(only PT should resolve) Goal: Patient Will Transfer Sit To/From Stand Outcome: Adequate for Discharge Goal: Pt Will Ambulate Outcome: Adequate for Discharge Goal: Pt Will Go Up/Down Stairs Outcome: Adequate for Discharge   Problem: Acute Rehab OT Goals (only OT should resolve) Goal: Pt. Will Transfer To Toilet Outcome: Adequate for Discharge Goal: Pt. Will Perform Toileting-Clothing Manipulation Outcome: Adequate for Discharge Goal: OT Additional ADL Goal #1 Outcome: Adequate for Discharge

## 2023-09-18 NOTE — Telephone Encounter (Signed)
 Copied from CRM [id number]. Topic: Clinical - Medication Refill >> Sep 18, 2023  2:46 PM Fonda Kinder J wrote: Most Recent Primary Care Visit:  Provider: Corwin Levins  Department: [facility]  Visit Type: SAME DAY  Date: 12/19/2022  Medication: famotidine (PEPCID) tablet 20 mg   Has the patient contacted their pharmacy? Yes (Agent: If no, request that the patient contact the pharmacy for the refill. If patient does not wish to contact the pharmacy document the reason why and proceed with request.) (Agent: If yes, when and what did the pharmacy advise?) Contact PCP  Is this the correct pharmacy for this prescription? Yes If no, delete pharmacy and type the correct one.  This is the patient's preferred pharmacy:  [id number] [place], [place] - [address] [place] [address] Phone: [phone] Fax: [phone]   Has the prescription been filled recently? Yes  Is the patient out of the medication? Yes  Has the patient been seen for an appointment in the last year OR does the patient have an upcoming appointment? Yes  Can we respond through MyChart? No  Agent: Please be advised that Rx refills may take up to 3 business days. We ask that you follow-up with your pharmacy.

## 2023-09-19 ENCOUNTER — Telehealth: Payer: Self-pay

## 2023-09-19 NOTE — Transitions of Care (Post Inpatient/ED Visit) (Signed)
   09/19/2023  Name: SINTHIA KARABIN MRN: [id number] DOB: Apr 19, 1946  Today's TOC FU Call Status: Today's TOC FU Call Status:: Unsuccessful Call (1st Attempt) Unsuccessful Call (1st Attempt) Date: 09/19/23  Attempted to reach the patient regarding the most recent Inpatient/ED visit.  Follow Up Plan: Additional outreach attempts will be made to reach the patient to complete the Transitions of Care (Post Inpatient/ED visit) call.   Signature Karena Addison, LPN [facility] Nurse Health Advisor Direct Dial [phone]

## 2023-09-23 NOTE — Transitions of Care (Post Inpatient/ED Visit) (Signed)
 09/23/2023  Name: Pamela Terry MRN: [id number] DOB: [birth year]  Today's TOC FU Call Status: Today's TOC FU Call Status:: Successful TOC FU Call Completed Unsuccessful Call (1st Attempt) Date: 09/19/23 [facility] FU Call Complete Date: 09/23/23 Patient's Name and Date of Birth confirmed.  Transition Care Management Follow-up Telephone Call Date of Discharge: 09/18/23 Discharge Facility: [facility]) Type of Discharge: Inpatient Admission Primary Inpatient Discharge Diagnosis:: PE How have you been since you were released from the hospital?: Same Any questions or concerns?: No  Items Reviewed: Did you receive and understand the discharge instructions provided?: Yes Medications obtained,verified, and reconciled?: Yes (Medications Reviewed) Any new allergies since your discharge?: No Dietary orders reviewed?: Yes Do you have support at home?: Yes People in Home: child(ren), adult  Medications Reviewed Today: Medications Reviewed Today     Reviewed by Karena Addison, LPN (Licensed Practical Nurse) on 09/23/23 at 1205  Med List Status: <None>   Medication Order Taking? Sig Documenting Provider Last Dose Status Informant  acetaminophen (TYLENOL) 325 MG tablet [id number] No Take 2 tablets (650 mg total) by mouth every 6 (six) hours as needed for moderate pain. Wallis Bamberg, PA-C Unknown Active Child, Family Member, Pharmacy Records  alendronate (FOSAMAX) 70 MG tablet [id number] No TAKE 1 TABLET BY MOUTH ONCE A WEEK. TAKE WITH A FULL GLASS OF WATER ON AN EMPTY STOMACH. Corwin Levins, MD Past Week Active Child, Family Member, Pharmacy Records  APIXABAN Everlene Balls) VTE STARTER PACK (10MG  AND 5MG ) [id number]  Take as directed on package: start with two-5mg  tablets twice daily for 7 days. On day 8, switch to one-5mg  tablet twice daily. Briant Cedar, MD  Active   atorvastatin (LIPITOR) 10 MG tablet [id number] No TAKE 1 TABLET BY MOUTH EVERY DAY Corwin Levins, MD 09/12/2023 Morning Active Child,  Family Member, Pharmacy Records  Calcium Carb-Cholecalciferol (CALTRATE BONE HEALTH PO) [id number] No Take by mouth. [provider] 09/12/2023 Morning Active Child, Family Member, Pharmacy Records  Cholecalciferol (VITAMIN D) 2000 UNITS CAPS [id number] No Take 1 capsule by mouth daily. [provider] 09/12/2023 Morning Active Child, Family Member, Pharmacy Records  citalopram (CELEXA) 10 MG tablet [id number] No TAKE 1 TABLET BY MOUTH EVERY DAY Corwin Levins, MD 09/12/2023 Morning Active Child, Family Member, Pharmacy Records  famotidine (PEPCID) 20 MG tablet [id number]  Take 1 tablet (20 mg total) by mouth daily. Corwin Levins, MD  Active   Fluticasone-Umeclidin-Vilant (TRELEGY ELLIPTA) 100-62.5-25 MCG/ACT AEPB [id number] No Inhale 1 puff into the lungs daily. Corwin Levins, MD 09/12/2023 Active Child, Family Member, Pharmacy Records  lidocaine (LIDODERM) 5 % [id number]  Place 1 patch onto the skin daily. Remove & Discard patch within 12 hours or as directed by MD Briant Cedar, MD  Active   memantine [facility]) 10 MG tablet [id number] No Take 1 tablet  10 mg 2 times a day Elwyn Reach 09/12/2023 Morning Active Child, Family Member, Pharmacy Records  oxyCODONE (OXY IR/ROXICODONE) 5 MG immediate release tablet [id number]  Take 1 tablet (5 mg total) by mouth every 6 (six) hours as needed for up to 5 days for moderate pain (pain score 4-6), severe pain (pain score 7-10) or breakthrough pain. Briant Cedar, MD  Active   vitamin B-12 (CYANOCOBALAMIN) 1000 MCG tablet [id number] No Take 1 tablet (1,000 mcg total) by mouth daily. Corwin Levins, MD 09/12/2023 Morning Active Child, Family Member, Pharmacy Records            Home Care and  Equipment/Supplies: Were Home Health Services Ordered?: Yes Name of Home Health Agency:: Hospice Has Agency set up a time to come to your home?: Yes First Home Health Visit Date: 09/22/23 Any new equipment or medical supplies ordered?: Yes Were  you able to get the equipment/medical supplies?: No Do you have any questions related to the use of the equipment/supplies?: No  Functional Questionnaire: Do you need assistance with bathing/showering or dressing?: Yes Do you need assistance with meal preparation?: Yes Do you need assistance with eating?: No Do you have difficulty maintaining continence: No Do you need assistance with getting out of bed/getting out of a chair/moving?: Yes Do you have difficulty managing or taking your medications?: Yes  Follow up appointments reviewed: PCP Follow-up appointment confirmed?: Yes Date of PCP follow-up appointment?: 09/24/23 Follow-up Provider: [facility] Follow-up appointment confirmed?: NA Do you need transportation to your follow-up appointment?: No Do you understand care options if your condition(s) worsen?: Yes-patient verbalized understanding    SIGNATURE Karena Addison, LPN [facility] Nurse Health Advisor Direct Dial [phone]

## 2023-09-24 ENCOUNTER — Encounter: Payer: Self-pay | Admitting: Internal Medicine

## 2023-09-24 ENCOUNTER — Ambulatory Visit (INDEPENDENT_AMBULATORY_CARE_PROVIDER_SITE_OTHER): Admitting: Internal Medicine

## 2023-09-24 VITALS — BP 120/66 | HR 55 | Temp 97.8°F | Ht 59.0 in

## 2023-09-24 DIAGNOSIS — J4489 Other specified chronic obstructive pulmonary disease: Secondary | ICD-10-CM

## 2023-09-24 NOTE — Patient Instructions (Signed)
 You are given the handicapped parking application  Please continue all other medications as before, and refills have been done if requested.  Please have the pharmacy call with any other refills you may need.  Please keep your appointments with your specialists as you may have planned  Please make an Appointment to return in 3 months, or sooner if needed

## 2023-09-24 NOTE — Progress Notes (Unsigned)
 Patient ID: Pamela Terry, female   DOB: April 06, 1946, 78 y.o.   MRN: [id number]        Chief Complaint: follow up post hospn 3/21 - 3/25 with acute PE, pneumonia, pulm effusion, copd, lung CA, hx of cervical ca now with home hospice       HPI:  Pamela Terry is a 78 y.o. female here with daughter, overall doing well;  Pt denies chest pain, increased sob or doe, wheezing, orthopnea, PND, increased LE swelling, palpitations, dizziness or syncope.   Pt denies polydipsia, polyuria, or new focal neuro s/s.    Pt denies fever, wt loss, night sweats, loss of appetite, or other constitutional symptoms  Has home o2 3L continuous but can go to 5L with exertion.  Tolerating eliquis well.  Home hospice has established with her at home.  Transitional Care Management elements noted today: 1)  Date of D/C: as above 2)  Medication reconciliation:  done today at end visit 3)  Review of D/C summary or other information:  done today 4)  Review of need for f/u on pending diagnostic tests and treatments:  done today 5)  Review of need for Interaction with other providers who will assume or resume care of pt specific problems: done today 6)  Education of patient/family/guardian or caregiver: done today - aughter  Wt Readings from Last 3 Encounters:  09/12/23 139 lb 15.9 oz (63.5 kg)  04/29/23 140 lb (63.5 kg)  12/19/22 146 lb (66.2 kg)   BP Readings from Last 3 Encounters:  09/24/23 120/66  09/18/23 (!) 148/64  04/29/23 (!) 140/70         Past Medical History:  Diagnosis Date   Acute asthmatic bronchitis    Adenocarcinoma, lung (HCC)    Allergic rhinitis    Anxiety    Carcinoma in situ of endocrine gland    Colon polyp    COPD (chronic obstructive pulmonary disease) (HCC)    Diverticulosis of colon    History of cervical cancer    Hypercholesterolemia    Microscopic hematuria    Osteoporosis    Past Surgical History:  Procedure Laterality Date   ABDOMINAL HYSTERECTOMY     in her 30's    APPENDECTOMY     IR THORACENTESIS ASP PLEURAL SPACE W/IMG GUIDE  09/15/2023   TOTAL ABDOMINAL HYSTERECTOMY W/ BILATERAL SALPINGOOPHORECTOMY  1985   Dr Elana Alm for Ca of cervix    reports that she quit smoking about 38 years ago. Her smoking use included cigarettes. She started smoking about 61 years ago. She has a 46 pack-year smoking history. She has never used smokeless tobacco. She reports that she does not drink alcohol and does not use drugs. family history includes Asthma in her maternal aunt; Breast cancer in her paternal grandmother; Diabetes in her brother; Heart disease in her father, maternal grandmother, and paternal grandfather; Hyperlipidemia in her father; Prostate cancer in her maternal grandfather; Rheum arthritis in her mother; Stroke in her father. Allergies  Allergen Reactions   Augmentin [Amoxicillin-Pot Clavulanate]     Severe diarrhea   Sulfonamide Derivatives Hives and Rash   Current Outpatient Medications on File Prior to Visit  Medication Sig Dispense Refill   acetaminophen (TYLENOL) 325 MG tablet Take 2 tablets (650 mg total) by mouth every 6 (six) hours as needed for moderate pain. 30 tablet 0   alendronate (FOSAMAX) 70 MG tablet TAKE 1 TABLET BY MOUTH ONCE A WEEK. TAKE WITH A FULL GLASS OF WATER ON AN EMPTY  STOMACH. 12 tablet 3   APIXABAN (ELIQUIS) VTE STARTER PACK (10MG  AND 5MG ) Take as directed on package: start with two-5mg  tablets twice daily for 7 days. On day 8, switch to one-5mg  tablet twice daily. 74 each 0   atorvastatin (LIPITOR) 10 MG tablet TAKE 1 TABLET BY MOUTH EVERY DAY 90 tablet 3   Calcium Carb-Cholecalciferol (CALTRATE BONE HEALTH PO) Take by mouth.     Cholecalciferol (VITAMIN D) 2000 UNITS CAPS Take 1 capsule by mouth daily.     citalopram (CELEXA) 10 MG tablet TAKE 1 TABLET BY MOUTH EVERY DAY 90 tablet 3   famotidine (PEPCID) 20 MG tablet Take 1 tablet (20 mg total) by mouth daily. 90 tablet 1   fluconazole (DIFLUCAN) 150 MG tablet Take 150 mg  by mouth once.     Fluticasone-Umeclidin-Vilant (TRELEGY ELLIPTA) 100-62.5-25 MCG/ACT AEPB Inhale 1 puff into the lungs daily. 28 each 3   lidocaine (LIDODERM) 5 % Place 1 patch onto the skin daily. Remove & Discard patch within 12 hours or as directed by MD 30 patch 0   LORazepam (ATIVAN) 0.5 MG tablet Take by mouth.     memantine (NAMENDA) 10 MG tablet Take 1 tablet  10 mg 2 times a day 180 tablet 4   vitamin B-12 (CYANOCOBALAMIN) 1000 MCG tablet Take 1 tablet (1,000 mcg total) by mouth daily. 90 tablet 1   No current facility-administered medications on file prior to visit.        ROS:  All others reviewed and negative.  Objective        PE:  BP 120/66 (BP Location: Left Arm, Patient Position: Sitting, Cuff Size: Normal)   Pulse (!) 55   Temp 97.8 F (36.6 C) (Oral)   Ht 4\' 11"  (1.499 m)   SpO2 93%   BMI 28.27 kg/m                 Constitutional: Pt appears in NAD               HENT: Head: NCAT.                Right Ear: External ear normal.                 Left Ear: External ear normal.                Eyes: . Pupils are equal, round, and reactive to light. Conjunctivae and EOM are normal               Nose: without d/c or deformity               Neck: Neck supple. Gross normal ROM               Cardiovascular: Normal rate and regular rhythm.                 Pulmonary/Chest: Effort normal and breath sounds without rales or wheezing.                Abd:  Soft, NT, ND, + BS, no organomegaly               Neurological: Pt is alert. At baseline orientation, motor grossly intact               Skin: Skin is warm. No rashes, no other new lesions, LE edema - none               Psychiatric: Pt behavior  is normal without agitation   Micro: none  Cardiac tracings I have personally interpreted today:  none  Pertinent Radiological findings (summarize): none   Lab Results  Component Value Date   WBC 9.7 09/17/2023   HGB 13.1 09/17/2023   HCT 41.6 09/17/2023   PLT 280 09/17/2023    GLUCOSE 93 09/17/2023   CHOL 152 12/20/2022   TRIG 101.0 12/20/2022   HDL 49.40 12/20/2022   LDLCALC 83 12/20/2022   ALT 22 09/12/2023   AST 29 09/12/2023   NA 139 09/17/2023   K 3.8 09/17/2023   CL 106 09/17/2023   CREATININE 1.19 (H) 09/17/2023   BUN 13 09/17/2023   CO2 27 09/17/2023   TSH 1.72 12/20/2022   HGBA1C 5.9 12/20/2022   Assessment/Plan:  Pamela Terry is a 78 y.o. White or Caucasian [1] female with  has a past medical history of Acute asthmatic bronchitis, Adenocarcinoma, lung (HCC), Allergic rhinitis, Anxiety, Carcinoma in situ of endocrine gland, Colon polyp, COPD (chronic obstructive pulmonary disease) (HCC), Diverticulosis of colon, History of cervical cancer, Hypercholesterolemia, Microscopic hematuria, and Osteoporosis.  COPD (chronic obstructive pulmonary disease) with chronic bronchitis (HCC) Stable overall, cont home o2, inhaler and hospice follow up  Followup: Return in about 3 months (around 12/24/2023).  Oliver Barre, MD 09/25/2023 7:55 PM [facility]

## 2023-09-25 ENCOUNTER — Encounter: Payer: Self-pay | Admitting: Internal Medicine

## 2023-09-25 NOTE — Assessment & Plan Note (Signed)
 Stable overall, cont home o2, inhaler and hospice follow up

## 2023-10-10 ENCOUNTER — Telehealth: Payer: Self-pay | Admitting: *Deleted

## 2023-10-10 NOTE — Transitions of Care (Post Inpatient/ED Visit) (Signed)
 10/10/2023  Name: NIQUITA DIGIOIA MRN: [id number] DOB: [birth year]  Today's TOC FU Call Status: Today's TOC FU Call Status:: Successful TOC FU Call Completed (Care Coordination outreach with [facility]:  spoke with "Stevens Eland"- verified patient was active with hospice services and passed away 2023/11/06) TOC FU Call Complete Date: 10/10/23  Care Coordination outreach with [facility]:  spoke with "Stevens Eland"- verified patient was active with hospice services and passed away 11-06-23; TOC call with patient deferred accordingly- patient's PCP notified of patient's passing  Patient's Name and Date of Birth confirmed.  Transition Care Management Follow-up Telephone Call    Items Reviewed:    Medications Reviewed Today: Medications Reviewed Today     Reviewed by Okechukwu Regnier M, RN (Registered Nurse) on 10/10/23 at [phone]  Med List Status: <None>   Medication Order Taking? Sig Documenting Provider Last Dose Status Informant  acetaminophen  (TYLENOL ) 325 MG tablet [id number] No Take 2 tablets (650 mg total) by mouth every 6 (six) hours as needed for moderate pain. Adolph Hoop, PA-C Taking Active Child, Family Member, Pharmacy Records  alendronate  (FOSAMAX ) 70 MG tablet [id number] No TAKE 1 TABLET BY MOUTH ONCE A WEEK. TAKE WITH A FULL GLASS OF WATER ON AN EMPTY STOMACH. Roslyn Coombe, MD Taking Active Child, Family Member, Pharmacy Records  APIXABAN  (ELIQUIS ) VTE STARTER PACK (10MG  AND 5MG ) [id number] No Take as directed on package: start with two-5mg  tablets twice daily for 7 days. On day 8, switch to one-5mg  tablet twice daily. Ezenduka, Nkeiruka J, MD Taking Active   atorvastatin  (LIPITOR) 10 MG tablet [id number] No TAKE 1 TABLET BY MOUTH EVERY DAY Roslyn Coombe, MD Taking Active Child, Family Member, Pharmacy Records  Calcium  Carb-Cholecalciferol (CALTRATE BONE HEALTH PO) [id number] No Take by mouth. [provider] Taking Active Child, Family Member, Pharmacy Records   Cholecalciferol (VITAMIN D ) 2000 UNITS CAPS [id number] No Take 1 capsule by mouth daily. [provider] Taking Active Child, Family Member, Pharmacy Records  citalopram  (CELEXA ) 10 MG tablet [id number] No TAKE 1 TABLET BY MOUTH EVERY DAY Roslyn Coombe, MD Taking Active Child, Family Member, Pharmacy Records  famotidine  (PEPCID ) 20 MG tablet [id number] No Take 1 tablet (20 mg total) by mouth daily. Roslyn Coombe, MD Taking Active   fluconazole  (DIFLUCAN ) 150 MG tablet [id number] No Take 150 mg by mouth once. [provider] Taking Active   Fluticasone -Umeclidin-Vilant (TRELEGY ELLIPTA ) 100-62.5-25 MCG/ACT AEPB [id number] No Inhale 1 puff into the lungs daily. Roslyn Coombe, MD Taking Active Child, Family Member, Pharmacy Records  lidocaine  (LIDODERM ) 5 % [id number] No Place 1 patch onto the skin daily. Remove & Discard patch within 12 hours or as directed by MD Ezenduka, Nkeiruka J, MD Taking Active   LORazepam  (ATIVAN ) 0.5 MG tablet [id number] No Take by mouth. [provider] Taking Active   memantine  (NAMENDA ) 10 MG tablet [id number] No Take 1 tablet  10 mg 2 times a day Wertman, Sara E, PA-C Taking Active Child, Family Member, Pharmacy Records  vitamin B-12 (CYANOCOBALAMIN ) 1000 MCG tablet [id number] No Take 1 tablet (1,000 mcg total) by mouth daily. Roslyn Coombe, MD Taking Active Child, Family Member, Pharmacy Records            Home Care and Equipment/Supplies:    Functional Questionnaire:    Follow up appointments reviewed:    Total time spent from review to signing of note/ including any care coordination interventions: 22 minutes  Pls call/ message for questions,  Lyriq Jarchow Mckinney Keylen Uzelac, RN, BSN, Media planner  Transitions of Care  [facility] [phone]: direct office

## 2023-10-23 DEATH — deceased

## 2023-10-28 ENCOUNTER — Ambulatory Visit: Payer: Medicare Other | Admitting: Physician Assistant

## 2023-12-07 ENCOUNTER — Other Ambulatory Visit: Payer: Self-pay | Admitting: Internal Medicine

## 2023-12-24 ENCOUNTER — Ambulatory Visit: Admitting: Internal Medicine
